# Patient Record
Sex: Female | Born: 1970
Health system: Southern US, Community
[De-identification: ages and names within clinical notes are randomized; demographics above are authoritative.]

## PROBLEM LIST (undated history)

## (undated) DIAGNOSIS — G8929 Other chronic pain: Secondary | ICD-10-CM

## (undated) DIAGNOSIS — M1990 Unspecified osteoarthritis, unspecified site: Secondary | ICD-10-CM

## (undated) DIAGNOSIS — R490 Dysphonia: Secondary | ICD-10-CM

## (undated) DIAGNOSIS — R519 Headache, unspecified: Secondary | ICD-10-CM

## (undated) DIAGNOSIS — R935 Abnormal findings on diagnostic imaging of other abdominal regions, including retroperitoneum: Secondary | ICD-10-CM

## (undated) DIAGNOSIS — K589 Irritable bowel syndrome without diarrhea: Secondary | ICD-10-CM

## (undated) DIAGNOSIS — F329 Major depressive disorder, single episode, unspecified: Secondary | ICD-10-CM

## (undated) DIAGNOSIS — G9050 Complex regional pain syndrome I, unspecified: Secondary | ICD-10-CM

## (undated) DIAGNOSIS — IMO0002 Reserved for concepts with insufficient information to code with codable children: Secondary | ICD-10-CM

## (undated) DIAGNOSIS — C801 Malignant (primary) neoplasm, unspecified: Secondary | ICD-10-CM

## (undated) DIAGNOSIS — F419 Anxiety disorder, unspecified: Secondary | ICD-10-CM

## (undated) DIAGNOSIS — D649 Anemia, unspecified: Secondary | ICD-10-CM

## (undated) DIAGNOSIS — K120 Recurrent oral aphthae: Secondary | ICD-10-CM

## (undated) DIAGNOSIS — B009 Herpesviral infection, unspecified: Secondary | ICD-10-CM

## (undated) DIAGNOSIS — D510 Vitamin B12 deficiency anemia due to intrinsic factor deficiency: Secondary | ICD-10-CM

## (undated) DIAGNOSIS — G629 Polyneuropathy, unspecified: Secondary | ICD-10-CM

## (undated) DIAGNOSIS — E785 Hyperlipidemia, unspecified: Secondary | ICD-10-CM

## (undated) DIAGNOSIS — F32A Depression, unspecified: Secondary | ICD-10-CM

## (undated) DIAGNOSIS — M5412 Radiculopathy, cervical region: Secondary | ICD-10-CM

## (undated) DIAGNOSIS — G4700 Insomnia, unspecified: Secondary | ICD-10-CM

## (undated) DIAGNOSIS — M76899 Other specified enthesopathies of unspecified lower limb, excluding foot: Secondary | ICD-10-CM

## (undated) HISTORY — DX: Major depressive disorder, single episode, unspecified: F32.9

## (undated) HISTORY — PX: OTHER SURGICAL HISTORY: SHX169

## (undated) HISTORY — DX: Herpesviral infection, unspecified: B00.9

## (undated) HISTORY — PX: WRIST SURGERY: SHX841

## (undated) HISTORY — DX: Abnormal findings on diagnostic imaging of other abdominal regions, including retroperitoneum: R93.5

## (undated) HISTORY — DX: Headache, unspecified: R51.9

## (undated) HISTORY — DX: Unspecified osteoarthritis, unspecified site: M19.90

## (undated) HISTORY — PX: HAND SURGERY: SHX662

## (undated) HISTORY — DX: Recurrent oral aphthae: K12.0

## (undated) HISTORY — DX: Complex regional pain syndrome I, unspecified: G90.50

## (undated) HISTORY — DX: Depression, unspecified: F32.A

## (undated) HISTORY — DX: Dysphonia: R49.0

## (undated) HISTORY — DX: Insomnia, unspecified: G47.00

## (undated) HISTORY — PX: KNEE SURGERY: SHX244

## (undated) HISTORY — PX: FOOT SURGERY: SHX648

## (undated) HISTORY — DX: Malignant (primary) neoplasm, unspecified: C80.1

## (undated) HISTORY — PX: BUNIONECTOMY: SHX129

## (undated) HISTORY — DX: Vitamin B12 deficiency anemia due to intrinsic factor deficiency: D51.0

## (undated) HISTORY — DX: Radiculopathy, cervical region: M54.12

## (undated) HISTORY — DX: Anemia, unspecified: D64.9

## (undated) HISTORY — DX: Anxiety disorder, unspecified: F41.9

## (undated) HISTORY — DX: Hyperlipidemia, unspecified: E78.5

## (undated) HISTORY — DX: Polyneuropathy, unspecified: G62.9

## (undated) HISTORY — PX: BACK SURGERY: SHX140

## (undated) HISTORY — DX: Other specified enthesopathies of unspecified lower limb, excluding foot: M76.899

## (undated) HISTORY — DX: Irritable bowel syndrome, unspecified: K58.9

## (undated) HISTORY — DX: Other chronic pain: G89.29

## (undated) HISTORY — DX: Reserved for concepts with insufficient information to code with codable children: IMO0002

---

## 1998-07-25 HISTORY — PX: ABDOMINAL HYSTERECTOMY: SHX81

## 2002-09-06 ENCOUNTER — Encounter: Admission: RE | Admit: 2002-09-06 | Discharge: 2002-10-18 | Payer: Self-pay | Admitting: *Deleted

## 2003-01-23 ENCOUNTER — Ambulatory Visit (HOSPITAL_BASED_OUTPATIENT_CLINIC_OR_DEPARTMENT_OTHER): Admission: RE | Admit: 2003-01-23 | Discharge: 2003-01-23 | Payer: Self-pay | Admitting: Orthopedic Surgery

## 2004-04-02 ENCOUNTER — Encounter: Admission: RE | Admit: 2004-04-02 | Discharge: 2004-04-02 | Payer: Self-pay | Admitting: Unknown Physician Specialty

## 2004-06-03 ENCOUNTER — Ambulatory Visit (HOSPITAL_BASED_OUTPATIENT_CLINIC_OR_DEPARTMENT_OTHER): Admission: RE | Admit: 2004-06-03 | Discharge: 2004-06-03 | Payer: Self-pay | Admitting: Orthopedic Surgery

## 2004-11-11 ENCOUNTER — Ambulatory Visit (HOSPITAL_BASED_OUTPATIENT_CLINIC_OR_DEPARTMENT_OTHER): Admission: RE | Admit: 2004-11-11 | Discharge: 2004-11-11 | Payer: Self-pay | Admitting: Orthopedic Surgery

## 2005-03-04 ENCOUNTER — Ambulatory Visit (HOSPITAL_COMMUNITY): Admission: RE | Admit: 2005-03-04 | Discharge: 2005-03-04 | Payer: Self-pay | Admitting: Orthopedic Surgery

## 2005-04-25 ENCOUNTER — Ambulatory Visit: Payer: Self-pay | Admitting: Internal Medicine

## 2005-06-10 ENCOUNTER — Ambulatory Visit: Payer: Self-pay | Admitting: Internal Medicine

## 2005-06-21 ENCOUNTER — Encounter: Payer: Self-pay | Admitting: Family Medicine

## 2005-06-21 ENCOUNTER — Ambulatory Visit: Payer: Self-pay | Admitting: Adult Health

## 2005-07-21 ENCOUNTER — Ambulatory Visit: Payer: Self-pay | Admitting: Internal Medicine

## 2005-09-14 ENCOUNTER — Ambulatory Visit: Payer: Self-pay | Admitting: Internal Medicine

## 2005-09-23 ENCOUNTER — Ambulatory Visit (HOSPITAL_COMMUNITY): Admission: RE | Admit: 2005-09-23 | Discharge: 2005-09-23 | Payer: Self-pay | Admitting: Orthopaedic Surgery

## 2005-10-04 ENCOUNTER — Encounter: Admission: RE | Admit: 2005-10-04 | Discharge: 2005-10-04 | Payer: Self-pay | Admitting: Orthopaedic Surgery

## 2005-10-18 ENCOUNTER — Ambulatory Visit: Payer: Self-pay | Admitting: Internal Medicine

## 2005-11-14 ENCOUNTER — Ambulatory Visit: Payer: Self-pay | Admitting: Internal Medicine

## 2006-01-23 ENCOUNTER — Ambulatory Visit: Payer: Self-pay | Admitting: Internal Medicine

## 2006-02-03 ENCOUNTER — Ambulatory Visit (HOSPITAL_COMMUNITY): Admission: RE | Admit: 2006-02-03 | Discharge: 2006-02-03 | Payer: Self-pay | Admitting: Internal Medicine

## 2006-02-03 ENCOUNTER — Encounter: Payer: Self-pay | Admitting: Family Medicine

## 2006-02-17 ENCOUNTER — Ambulatory Visit: Payer: Self-pay | Admitting: Internal Medicine

## 2006-02-20 ENCOUNTER — Encounter: Payer: Self-pay | Admitting: Family Medicine

## 2006-02-27 ENCOUNTER — Encounter: Payer: Self-pay | Admitting: Family Medicine

## 2006-02-27 ENCOUNTER — Encounter: Admission: RE | Admit: 2006-02-27 | Discharge: 2006-02-27 | Payer: Self-pay | Admitting: Internal Medicine

## 2006-03-13 ENCOUNTER — Encounter: Admission: RE | Admit: 2006-03-13 | Discharge: 2006-03-13 | Payer: Self-pay | Admitting: Internal Medicine

## 2006-03-23 ENCOUNTER — Ambulatory Visit: Payer: Self-pay | Admitting: Gastroenterology

## 2006-03-23 DIAGNOSIS — R935 Abnormal findings on diagnostic imaging of other abdominal regions, including retroperitoneum: Secondary | ICD-10-CM

## 2006-03-23 HISTORY — DX: Abnormal findings on diagnostic imaging of other abdominal regions, including retroperitoneum: R93.5

## 2006-03-24 ENCOUNTER — Encounter: Payer: Self-pay | Admitting: Family Medicine

## 2006-03-24 ENCOUNTER — Ambulatory Visit (HOSPITAL_COMMUNITY): Admission: RE | Admit: 2006-03-24 | Discharge: 2006-03-24 | Payer: Self-pay | Admitting: Internal Medicine

## 2006-05-01 ENCOUNTER — Ambulatory Visit: Payer: Self-pay | Admitting: Internal Medicine

## 2006-07-27 ENCOUNTER — Telehealth: Payer: Self-pay | Admitting: Family Medicine

## 2006-07-27 ENCOUNTER — Telehealth (INDEPENDENT_AMBULATORY_CARE_PROVIDER_SITE_OTHER): Payer: Self-pay | Admitting: *Deleted

## 2006-07-27 ENCOUNTER — Ambulatory Visit: Payer: Self-pay | Admitting: Family Medicine

## 2006-07-28 ENCOUNTER — Emergency Department (HOSPITAL_COMMUNITY): Admission: EM | Admit: 2006-07-28 | Discharge: 2006-07-28 | Payer: Self-pay | Admitting: Emergency Medicine

## 2006-08-21 ENCOUNTER — Encounter: Payer: Self-pay | Admitting: Family Medicine

## 2006-09-18 ENCOUNTER — Encounter: Payer: Self-pay | Admitting: Family Medicine

## 2006-09-22 ENCOUNTER — Telehealth: Payer: Self-pay | Admitting: Family Medicine

## 2006-10-03 ENCOUNTER — Encounter: Payer: Self-pay | Admitting: Family Medicine

## 2006-10-04 ENCOUNTER — Ambulatory Visit: Payer: Self-pay | Admitting: Family Medicine

## 2006-10-04 DIAGNOSIS — M549 Dorsalgia, unspecified: Secondary | ICD-10-CM | POA: Insufficient documentation

## 2006-10-04 DIAGNOSIS — G4700 Insomnia, unspecified: Secondary | ICD-10-CM | POA: Insufficient documentation

## 2006-10-04 DIAGNOSIS — F5104 Psychophysiologic insomnia: Secondary | ICD-10-CM | POA: Insufficient documentation

## 2006-10-27 ENCOUNTER — Ambulatory Visit: Payer: Self-pay | Admitting: Family Medicine

## 2006-10-27 DIAGNOSIS — B009 Herpesviral infection, unspecified: Secondary | ICD-10-CM | POA: Insufficient documentation

## 2006-10-27 DIAGNOSIS — L299 Pruritus, unspecified: Secondary | ICD-10-CM | POA: Insufficient documentation

## 2006-11-01 ENCOUNTER — Telehealth: Payer: Self-pay | Admitting: Family Medicine

## 2006-11-02 ENCOUNTER — Encounter: Payer: Self-pay | Admitting: Family Medicine

## 2006-11-23 ENCOUNTER — Telehealth: Payer: Self-pay | Admitting: Family Medicine

## 2006-12-06 ENCOUNTER — Ambulatory Visit: Payer: Self-pay | Admitting: Family Medicine

## 2007-01-03 ENCOUNTER — Telehealth: Payer: Self-pay | Admitting: Family Medicine

## 2007-01-03 ENCOUNTER — Encounter: Payer: Self-pay | Admitting: Family Medicine

## 2007-01-04 ENCOUNTER — Encounter: Admission: RE | Admit: 2007-01-04 | Discharge: 2007-01-04 | Payer: Self-pay | Admitting: Family Medicine

## 2007-01-12 ENCOUNTER — Telehealth: Payer: Self-pay | Admitting: Family Medicine

## 2007-01-25 ENCOUNTER — Ambulatory Visit: Payer: Self-pay | Admitting: Family Medicine

## 2007-01-25 DIAGNOSIS — M76899 Other specified enthesopathies of unspecified lower limb, excluding foot: Secondary | ICD-10-CM | POA: Insufficient documentation

## 2007-01-25 DIAGNOSIS — M79609 Pain in unspecified limb: Secondary | ICD-10-CM | POA: Insufficient documentation

## 2007-02-01 ENCOUNTER — Encounter: Admission: RE | Admit: 2007-02-01 | Discharge: 2007-02-01 | Payer: Self-pay | Admitting: Sports Medicine

## 2007-02-02 ENCOUNTER — Encounter: Admission: RE | Admit: 2007-02-02 | Discharge: 2007-02-02 | Payer: Self-pay | Admitting: Sports Medicine

## 2007-02-08 ENCOUNTER — Ambulatory Visit: Payer: Self-pay | Admitting: Family Medicine

## 2007-02-21 ENCOUNTER — Telehealth: Payer: Self-pay | Admitting: Family Medicine

## 2007-02-27 ENCOUNTER — Encounter: Payer: Self-pay | Admitting: Family Medicine

## 2007-02-28 ENCOUNTER — Telehealth: Payer: Self-pay | Admitting: Family Medicine

## 2007-03-16 ENCOUNTER — Telehealth: Payer: Self-pay | Admitting: Family Medicine

## 2007-03-16 ENCOUNTER — Encounter: Payer: Self-pay | Admitting: Family Medicine

## 2007-03-23 ENCOUNTER — Encounter: Payer: Self-pay | Admitting: Family Medicine

## 2007-03-23 DIAGNOSIS — G8929 Other chronic pain: Secondary | ICD-10-CM | POA: Insufficient documentation

## 2007-03-23 DIAGNOSIS — Z1211 Encounter for screening for malignant neoplasm of colon: Secondary | ICD-10-CM | POA: Insufficient documentation

## 2007-03-23 DIAGNOSIS — K120 Recurrent oral aphthae: Secondary | ICD-10-CM | POA: Insufficient documentation

## 2007-03-24 ENCOUNTER — Ambulatory Visit (HOSPITAL_COMMUNITY): Admission: RE | Admit: 2007-03-24 | Discharge: 2007-03-24 | Payer: Self-pay | Admitting: Neurology

## 2007-03-25 DIAGNOSIS — M1990 Unspecified osteoarthritis, unspecified site: Secondary | ICD-10-CM | POA: Insufficient documentation

## 2007-03-25 DIAGNOSIS — E785 Hyperlipidemia, unspecified: Secondary | ICD-10-CM | POA: Insufficient documentation

## 2007-03-25 DIAGNOSIS — F339 Major depressive disorder, recurrent, unspecified: Secondary | ICD-10-CM | POA: Insufficient documentation

## 2007-03-29 ENCOUNTER — Telehealth: Payer: Self-pay | Admitting: Family Medicine

## 2007-04-30 ENCOUNTER — Telehealth: Payer: Self-pay | Admitting: Family Medicine

## 2007-05-17 ENCOUNTER — Telehealth: Payer: Self-pay | Admitting: Family Medicine

## 2007-05-31 ENCOUNTER — Telehealth: Payer: Self-pay | Admitting: Family Medicine

## 2007-06-14 ENCOUNTER — Telehealth: Payer: Self-pay | Admitting: Family Medicine

## 2007-06-15 ENCOUNTER — Telehealth: Payer: Self-pay | Admitting: Family Medicine

## 2007-06-20 ENCOUNTER — Telehealth: Payer: Self-pay | Admitting: Family Medicine

## 2007-08-06 ENCOUNTER — Telehealth: Payer: Self-pay | Admitting: Family Medicine

## 2007-08-09 ENCOUNTER — Ambulatory Visit: Payer: Self-pay | Admitting: Family Medicine

## 2007-08-15 ENCOUNTER — Telehealth: Payer: Self-pay | Admitting: Family Medicine

## 2007-08-15 ENCOUNTER — Encounter: Admission: RE | Admit: 2007-08-15 | Discharge: 2007-08-15 | Payer: Self-pay | Admitting: Family Medicine

## 2007-08-17 ENCOUNTER — Encounter: Admission: RE | Admit: 2007-08-17 | Discharge: 2007-08-17 | Payer: Self-pay | Admitting: Family Medicine

## 2007-08-20 ENCOUNTER — Encounter: Payer: Self-pay | Admitting: Family Medicine

## 2007-08-28 ENCOUNTER — Telehealth: Payer: Self-pay | Admitting: Family Medicine

## 2007-09-04 ENCOUNTER — Ambulatory Visit: Payer: Self-pay | Admitting: Family Medicine

## 2007-09-06 ENCOUNTER — Ambulatory Visit: Payer: Self-pay | Admitting: Family Medicine

## 2007-09-06 DIAGNOSIS — D510 Vitamin B12 deficiency anemia due to intrinsic factor deficiency: Secondary | ICD-10-CM | POA: Insufficient documentation

## 2007-09-18 ENCOUNTER — Encounter: Admission: RE | Admit: 2007-09-18 | Discharge: 2007-09-18 | Payer: Self-pay | Admitting: Family Medicine

## 2007-10-08 ENCOUNTER — Ambulatory Visit: Payer: Self-pay | Admitting: Family Medicine

## 2007-10-09 ENCOUNTER — Ambulatory Visit (HOSPITAL_COMMUNITY): Admission: RE | Admit: 2007-10-09 | Discharge: 2007-10-09 | Payer: Self-pay | Admitting: General Surgery

## 2007-10-09 ENCOUNTER — Telehealth: Payer: Self-pay | Admitting: Family Medicine

## 2007-10-09 LAB — CONVERTED CEMR LAB
BUN: 7 mg/dL (ref 6–23)
CO2: 25 meq/L (ref 19–32)
Calcium: 9.5 mg/dL (ref 8.4–10.5)
Chloride: 104 meq/L (ref 96–112)
Creatinine, Ser: 0.65 mg/dL (ref 0.40–1.20)
Hemoglobin: 14.7 g/dL (ref 12.0–15.0)
Lymphocytes Relative: 29 % (ref 12–46)
Monocytes Absolute: 0.5 10*3/uL (ref 0.1–1.0)
Monocytes Relative: 7 % (ref 3–12)
Neutro Abs: 4.5 10*3/uL (ref 1.7–7.7)
RBC: 4.89 M/uL (ref 3.87–5.11)

## 2007-10-11 ENCOUNTER — Ambulatory Visit: Payer: Self-pay | Admitting: Internal Medicine

## 2007-10-23 ENCOUNTER — Ambulatory Visit: Payer: Self-pay | Admitting: Internal Medicine

## 2007-10-23 ENCOUNTER — Encounter: Payer: Self-pay | Admitting: Internal Medicine

## 2007-10-23 ENCOUNTER — Encounter: Payer: Self-pay | Admitting: Family Medicine

## 2007-10-23 LAB — CONVERTED CEMR LAB: Cortisol, Plasma: 11.7 ug/dL

## 2007-11-09 ENCOUNTER — Telehealth: Payer: Self-pay | Admitting: Family Medicine

## 2007-11-15 ENCOUNTER — Ambulatory Visit: Payer: Self-pay | Admitting: Family Medicine

## 2007-11-15 DIAGNOSIS — N644 Mastodynia: Secondary | ICD-10-CM | POA: Insufficient documentation

## 2007-11-23 ENCOUNTER — Encounter: Admission: RE | Admit: 2007-11-23 | Discharge: 2007-11-23 | Payer: Self-pay | Admitting: Family Medicine

## 2007-11-27 ENCOUNTER — Encounter: Payer: Self-pay | Admitting: Family Medicine

## 2007-11-28 ENCOUNTER — Encounter: Payer: Self-pay | Admitting: Family Medicine

## 2007-12-10 ENCOUNTER — Encounter: Admission: RE | Admit: 2007-12-10 | Discharge: 2007-12-10 | Payer: Self-pay | Admitting: Family Medicine

## 2007-12-10 ENCOUNTER — Encounter (INDEPENDENT_AMBULATORY_CARE_PROVIDER_SITE_OTHER): Payer: Self-pay | Admitting: Diagnostic Radiology

## 2007-12-14 ENCOUNTER — Encounter: Payer: Self-pay | Admitting: Family Medicine

## 2007-12-14 ENCOUNTER — Encounter: Admission: RE | Admit: 2007-12-14 | Discharge: 2008-02-14 | Payer: Self-pay | Admitting: Family Medicine

## 2007-12-14 DIAGNOSIS — M5412 Radiculopathy, cervical region: Secondary | ICD-10-CM | POA: Insufficient documentation

## 2007-12-28 ENCOUNTER — Telehealth: Payer: Self-pay | Admitting: Family Medicine

## 2008-01-04 ENCOUNTER — Encounter: Admission: RE | Admit: 2008-01-04 | Discharge: 2008-01-04 | Payer: Self-pay | Admitting: Family Medicine

## 2008-01-04 ENCOUNTER — Encounter (INDEPENDENT_AMBULATORY_CARE_PROVIDER_SITE_OTHER): Payer: Self-pay | Admitting: Diagnostic Radiology

## 2008-01-14 ENCOUNTER — Telehealth: Payer: Self-pay | Admitting: Family Medicine

## 2008-01-17 ENCOUNTER — Encounter: Payer: Self-pay | Admitting: Family Medicine

## 2008-02-01 ENCOUNTER — Telehealth: Payer: Self-pay | Admitting: Family Medicine

## 2008-02-01 ENCOUNTER — Encounter: Admission: RE | Admit: 2008-02-01 | Discharge: 2008-02-01 | Payer: Self-pay | Admitting: Family Medicine

## 2008-02-05 ENCOUNTER — Telehealth: Payer: Self-pay | Admitting: Family Medicine

## 2008-02-18 ENCOUNTER — Telehealth: Payer: Self-pay | Admitting: Family Medicine

## 2008-02-19 ENCOUNTER — Telehealth: Payer: Self-pay | Admitting: Family Medicine

## 2008-02-26 ENCOUNTER — Encounter: Payer: Self-pay | Admitting: Family Medicine

## 2008-03-20 ENCOUNTER — Ambulatory Visit: Payer: Self-pay | Admitting: Family Medicine

## 2008-05-26 ENCOUNTER — Ambulatory Visit: Payer: Self-pay | Admitting: Family Medicine

## 2008-06-06 ENCOUNTER — Encounter: Payer: Self-pay | Admitting: Family Medicine

## 2008-08-01 ENCOUNTER — Telehealth: Payer: Self-pay | Admitting: Family Medicine

## 2008-08-11 ENCOUNTER — Ambulatory Visit: Payer: Self-pay | Admitting: Family Medicine

## 2008-08-11 ENCOUNTER — Encounter: Admission: RE | Admit: 2008-08-11 | Discharge: 2008-08-11 | Payer: Self-pay | Admitting: Family Medicine

## 2008-08-11 DIAGNOSIS — M25569 Pain in unspecified knee: Secondary | ICD-10-CM | POA: Insufficient documentation

## 2008-09-03 ENCOUNTER — Telehealth: Payer: Self-pay | Admitting: Family Medicine

## 2008-09-03 ENCOUNTER — Ambulatory Visit: Payer: Self-pay | Admitting: Family Medicine

## 2008-09-03 DIAGNOSIS — R5381 Other malaise: Secondary | ICD-10-CM | POA: Insufficient documentation

## 2008-09-03 DIAGNOSIS — R5383 Other fatigue: Secondary | ICD-10-CM

## 2008-09-08 LAB — CONVERTED CEMR LAB
AST: 12 units/L (ref 0–37)
Albumin: 4.6 g/dL (ref 3.5–5.2)
Alkaline Phosphatase: 63 units/L (ref 39–117)
BUN: 14 mg/dL (ref 6–23)
FSH: 6.7 milliintl units/mL
Hemoglobin: 14.3 g/dL (ref 12.0–15.0)
MCHC: 31.9 g/dL (ref 30.0–36.0)
Potassium: 4.4 meq/L (ref 3.5–5.3)
Progesterone: 13.8 ng/mL
RDW: 13 % (ref 11.5–15.5)
Sodium: 139 meq/L (ref 135–145)
TIBC: 306 ug/dL (ref 250–470)
Total Bilirubin: 0.5 mg/dL (ref 0.3–1.2)
UIBC: 222 ug/dL
Vit D, 1,25-Dihydroxy: 16 — ABNORMAL LOW (ref 30–89)

## 2008-09-09 ENCOUNTER — Telehealth: Payer: Self-pay | Admitting: Family Medicine

## 2008-09-29 ENCOUNTER — Telehealth: Payer: Self-pay | Admitting: Family Medicine

## 2008-10-07 ENCOUNTER — Telehealth (INDEPENDENT_AMBULATORY_CARE_PROVIDER_SITE_OTHER): Payer: Self-pay | Admitting: *Deleted

## 2008-10-15 ENCOUNTER — Telehealth: Payer: Self-pay | Admitting: Family Medicine

## 2008-10-17 ENCOUNTER — Encounter: Admission: RE | Admit: 2008-10-17 | Discharge: 2008-10-17 | Payer: Self-pay | Admitting: Family Medicine

## 2008-11-03 ENCOUNTER — Encounter: Payer: Self-pay | Admitting: Family Medicine

## 2009-01-15 ENCOUNTER — Telehealth: Payer: Self-pay | Admitting: Family Medicine

## 2009-02-13 ENCOUNTER — Ambulatory Visit (HOSPITAL_COMMUNITY): Payer: Self-pay | Admitting: Psychiatry

## 2009-02-18 ENCOUNTER — Ambulatory Visit (HOSPITAL_COMMUNITY): Payer: Self-pay | Admitting: Psychiatry

## 2009-02-25 ENCOUNTER — Ambulatory Visit (HOSPITAL_COMMUNITY): Payer: Self-pay | Admitting: Psychiatry

## 2009-03-12 ENCOUNTER — Ambulatory Visit (HOSPITAL_COMMUNITY): Payer: Self-pay | Admitting: Psychology

## 2009-04-03 ENCOUNTER — Encounter: Admission: RE | Admit: 2009-04-03 | Discharge: 2009-04-03 | Payer: Self-pay | Admitting: Family Medicine

## 2009-04-13 ENCOUNTER — Ambulatory Visit: Payer: Self-pay | Admitting: Family Medicine

## 2009-04-23 ENCOUNTER — Ambulatory Visit: Payer: Self-pay | Admitting: Family Medicine

## 2009-04-23 DIAGNOSIS — R0789 Other chest pain: Secondary | ICD-10-CM | POA: Insufficient documentation

## 2009-04-23 DIAGNOSIS — R002 Palpitations: Secondary | ICD-10-CM | POA: Insufficient documentation

## 2009-04-29 ENCOUNTER — Encounter: Payer: Self-pay | Admitting: Family Medicine

## 2009-04-29 ENCOUNTER — Encounter: Payer: Self-pay | Admitting: Cardiology

## 2009-04-29 ENCOUNTER — Ambulatory Visit: Payer: Self-pay | Admitting: Cardiology

## 2009-04-29 DIAGNOSIS — F17200 Nicotine dependence, unspecified, uncomplicated: Secondary | ICD-10-CM | POA: Insufficient documentation

## 2009-05-15 ENCOUNTER — Ambulatory Visit: Payer: Self-pay

## 2009-05-15 ENCOUNTER — Ambulatory Visit (HOSPITAL_COMMUNITY): Admission: RE | Admit: 2009-05-15 | Discharge: 2009-05-15 | Payer: Self-pay | Admitting: Internal Medicine

## 2009-05-15 ENCOUNTER — Encounter: Payer: Self-pay | Admitting: Cardiology

## 2009-05-15 ENCOUNTER — Ambulatory Visit: Payer: Self-pay | Admitting: Internal Medicine

## 2009-05-21 ENCOUNTER — Ambulatory Visit: Payer: Self-pay | Admitting: Family Medicine

## 2009-06-30 ENCOUNTER — Telehealth: Payer: Self-pay | Admitting: Family Medicine

## 2009-07-01 ENCOUNTER — Telehealth: Payer: Self-pay | Admitting: Family Medicine

## 2009-07-13 ENCOUNTER — Ambulatory Visit: Payer: Self-pay | Admitting: Family Medicine

## 2009-07-13 DIAGNOSIS — J209 Acute bronchitis, unspecified: Secondary | ICD-10-CM | POA: Insufficient documentation

## 2009-07-20 ENCOUNTER — Ambulatory Visit: Payer: Self-pay | Admitting: Family Medicine

## 2009-08-25 ENCOUNTER — Telehealth: Payer: Self-pay | Admitting: Family Medicine

## 2009-08-25 ENCOUNTER — Encounter: Payer: Self-pay | Admitting: Family Medicine

## 2009-08-25 LAB — CONVERTED CEMR LAB
Blood in Urine, dipstick: NEGATIVE
Glucose, Urine, Semiquant: NEGATIVE
Ketones, urine, test strip: NEGATIVE
pH: 5.5

## 2009-08-27 ENCOUNTER — Telehealth: Payer: Self-pay | Admitting: Family Medicine

## 2009-09-07 ENCOUNTER — Encounter: Payer: Self-pay | Admitting: Family Medicine

## 2009-09-24 ENCOUNTER — Telehealth: Payer: Self-pay | Admitting: Family Medicine

## 2009-10-23 ENCOUNTER — Telehealth: Payer: Self-pay | Admitting: Family Medicine

## 2009-10-29 ENCOUNTER — Telehealth: Payer: Self-pay | Admitting: Family Medicine

## 2010-01-07 ENCOUNTER — Encounter: Payer: Self-pay | Admitting: Family Medicine

## 2010-01-07 ENCOUNTER — Encounter: Admission: RE | Admit: 2010-01-07 | Discharge: 2010-01-07 | Payer: Self-pay | Admitting: Sports Medicine

## 2010-01-15 ENCOUNTER — Encounter: Admission: RE | Admit: 2010-01-15 | Discharge: 2010-03-25 | Payer: Self-pay | Admitting: Sports Medicine

## 2010-02-08 ENCOUNTER — Encounter: Payer: Self-pay | Admitting: Family Medicine

## 2010-02-09 ENCOUNTER — Ambulatory Visit: Payer: Self-pay | Admitting: Family Medicine

## 2010-02-09 ENCOUNTER — Encounter: Admission: RE | Admit: 2010-02-09 | Discharge: 2010-02-09 | Payer: Self-pay | Admitting: Family Medicine

## 2010-02-09 DIAGNOSIS — M25559 Pain in unspecified hip: Secondary | ICD-10-CM | POA: Insufficient documentation

## 2010-02-09 DIAGNOSIS — IMO0002 Reserved for concepts with insufficient information to code with codable children: Secondary | ICD-10-CM | POA: Insufficient documentation

## 2010-02-10 ENCOUNTER — Encounter: Payer: Self-pay | Admitting: Family Medicine

## 2010-03-09 ENCOUNTER — Ambulatory Visit: Payer: Self-pay | Admitting: Family Medicine

## 2010-03-09 DIAGNOSIS — M2550 Pain in unspecified joint: Secondary | ICD-10-CM | POA: Insufficient documentation

## 2010-03-09 DIAGNOSIS — N951 Menopausal and female climacteric states: Secondary | ICD-10-CM | POA: Insufficient documentation

## 2010-03-09 DIAGNOSIS — F341 Dysthymic disorder: Secondary | ICD-10-CM | POA: Insufficient documentation

## 2010-03-11 ENCOUNTER — Encounter: Payer: Self-pay | Admitting: Family Medicine

## 2010-03-18 LAB — CONVERTED CEMR LAB
CRP: 0 mg/dL (ref <0.6)
HCT: 46.2 % — ABNORMAL HIGH (ref 36.0–46.0)
Hemoglobin: 14.8 g/dL (ref 12.0–15.0)
Platelets: 329 10*3/uL (ref 150–400)
RDW: 13.5 % (ref 11.5–15.5)
Rhuematoid fact SerPl-aCnc: <20 intl units/mL (ref 0–20)
TSH: 0.84 microintl units/mL (ref 0.350–4.500)
WBC: 8.3 10*3/uL (ref 4.0–10.5)

## 2010-04-09 ENCOUNTER — Telehealth: Payer: Self-pay | Admitting: Family Medicine

## 2010-04-21 ENCOUNTER — Encounter: Admission: RE | Admit: 2010-04-21 | Discharge: 2010-04-21 | Payer: Self-pay | Admitting: Family Medicine

## 2010-08-03 ENCOUNTER — Ambulatory Visit
Admission: RE | Admit: 2010-08-03 | Discharge: 2010-08-03 | Payer: Self-pay | Source: Home / Self Care | Attending: Family Medicine | Admitting: Family Medicine

## 2010-08-03 DIAGNOSIS — N63 Unspecified lump in unspecified breast: Secondary | ICD-10-CM | POA: Insufficient documentation

## 2010-08-05 ENCOUNTER — Encounter
Admission: RE | Admit: 2010-08-05 | Discharge: 2010-08-05 | Payer: Self-pay | Source: Home / Self Care | Attending: Family Medicine | Admitting: Family Medicine

## 2010-08-15 ENCOUNTER — Encounter: Payer: Self-pay | Admitting: Family Medicine

## 2010-08-22 LAB — CONVERTED CEMR LAB
ALT: 17 units/L (ref 0–35)
AST: 22 units/L (ref 0–37)
Albumin: 4.3 g/dL (ref 3.5–5.2)
Alkaline Phosphatase: 47 units/L (ref 39–117)
Basophils Absolute: 0 10*3/uL (ref 0.0–0.1)
Calcium: 9.6 mg/dL (ref 8.4–10.5)
Chloride: 103 meq/L (ref 96–112)
HCT: 40.1 % (ref 36.0–46.0)
LDL Cholesterol: 122 mg/dL — ABNORMAL HIGH (ref 0–99)
Lymphocytes Relative: 24 % (ref 12–46)
Lymphs Abs: 2.1 10*3/uL (ref 0.7–4.0)
Neutro Abs: 5.6 10*3/uL (ref 1.7–7.7)
Neutrophils Relative %: 65 % (ref 43–77)
Platelets: 290 10*3/uL (ref 150–400)
Potassium: 4.7 meq/L (ref 3.5–5.3)
Sodium: 136 meq/L (ref 135–145)
TSH: 1.798 microintl units/mL (ref 0.350–4.500)
WBC: 8.6 10*3/uL (ref 4.0–10.5)

## 2010-08-24 NOTE — Progress Notes (Signed)
Summary: + mono  Phone Note Call from Patient   Summary of Call: Tested + for mono. Have severe back painwith it.  Will call in tramadol for pain. OUt of work for 2 days.  Initial call taken by: Nani Gasser MD,  August 27, 2009 9:12 AM    New/Updated Medications: TRAMADOL HCL 50 MG TABS (TRAMADOL HCL) Take 1 tablet by mouth three times a day as needed back pain. Prescriptions: TRAMADOL HCL 50 MG TABS (TRAMADOL HCL) Take 1 tablet by mouth three times a day as needed back pain.  #30 x 0   Entered and Authorized by:   Nani Gasser MD   Signed by:   Nani Gasser MD on 08/27/2009   Method used:   Electronically to        [facility] [phone]* (retail)       [address] [place], [place]  [address]       Ph: [phone]       Fax: [phone]   RxID:   [id number]

## 2010-08-24 NOTE — Progress Notes (Signed)
----   Converted from flag ---- ---- 09/23/2009 11:46 AM, Kathlene November wrote: Dr. Linford Arnold can I have some Phentermine generic I can not stop this eating binge and I know this will help stop it and get me on the right track. ------------------------------       New/Updated Medications: PHENTERMINE HCL 37.5 MG CAPS (PHENTERMINE HCL) Take 1 tablet by mouth once a day Prescriptions: PHENTERMINE HCL 37.5 MG CAPS (PHENTERMINE HCL) Take 1 tablet by mouth once a day  #30 x 0   Entered and Authorized by:   Nani Gasser MD   Signed by:   Nani Gasser MD on 09/24/2009   Method used:   Print then Give to Patient   RxID:   [id number]

## 2010-08-24 NOTE — Progress Notes (Signed)
Summary: Wants to start Chantix.   Phone Note Outgoing Call   Summary of Call: Would like to start Chantix. Discussed if any mood changes or bad dreams then needs to stop the medications.  Initial call taken by: Nani Gasser MD,  October 29, 2009 3:05 PM    New/Updated Medications: CHANTIX STARTING MONTH PAK 0.5 MG X 11 & 1 MG X 42 TABS (VARENICLINE TARTRATE) Take as directed. Prescriptions: CHANTIX STARTING MONTH PAK 0.5 MG X 11 & 1 MG X 42 TABS (VARENICLINE TARTRATE) Take as directed.  #1 pack x 0   Entered and Authorized by:   Nani Gasser MD   Signed by:   Nani Gasser MD on 10/29/2009   Method used:   Printed then faxed to ...       [facility]* (retail)       [address]       [address] Shipping/mailing       [place], [place]  [address]       Ph: [phone]       Fax: [phone]   RxID:   [id number]

## 2010-08-24 NOTE — Progress Notes (Signed)
Summary: UTI  Phone Note Call from Patient   Summary of Call: Urinary frequency and low back pain for several days. No fever or recent ABX. No hematuria. UA + for Large LE so will treat and if not better in 3 days then let me know.  Initial call taken by: Nani Gasser MD,  August 25, 2009 1:25 PM    New/Updated Medications: BACTRIM DS 800-160 MG TABS (SULFAMETHOXAZOLE-TRIMETHOPRIM) Take 1 tablet by mouth two times a day for 3 days Prescriptions: BACTRIM DS 800-160 MG TABS (SULFAMETHOXAZOLE-TRIMETHOPRIM) Take 1 tablet by mouth two times a day for 3 days  #6 x 0   Entered and Authorized by:   Nani Gasser MD   Signed by:   Nani Gasser MD on 08/25/2009   Method used:   Electronically to        [facility] [phone]* (retail)       [address] [place], [place]  [address]       Ph: [phone]       Fax: [phone]   RxID:   [id number]   Laboratory Results   Urine Tests  Date/Time Received: February  1, 20111:27 PM 1:27 PM   Routine Urinalysis   Appearance: Clear Glucose: negative   (Normal Range: Negative) Bilirubin: negative   (Normal Range: Negative) Ketone: negative   (Normal Range: Negative) Spec. Gravity: <1.005   (Normal Range: 1.003-1.035) Blood: negative   (Normal Range: Negative) pH: 5.5   (Normal Range: 5.0-8.0) Protein: negative   (Normal Range: Negative) Urobilinogen: 0.2   (Normal Range: 0-1) Nitrite: negative   (Normal Range: Negative) Leukocyte Esterace: large   (Normal Range: Negative)

## 2010-08-24 NOTE — Consult Note (Signed)
Summary: [facility]  [facility]   Imported By: Marily Memos 03/02/2010 13:44:10  _____________________________________________________________________  External Attachment:    Type:   Image     Comment:   External Document

## 2010-08-24 NOTE — Miscellaneous (Signed)
Summary: [facility]  [facility]   Imported By: Marily Memos 03/02/2010 13:29:19  _____________________________________________________________________  External Attachment:    Type:   Image     Comment:   External Document

## 2010-08-24 NOTE — Assessment & Plan Note (Signed)
Summary: Hot flashes, mood, etc   Vital Signs:  Patient profile:   40 year old female Height:      65 inches Weight:      140 pounds Pulse rate:   84 / minute BP sitting:   110 / 64  (left arm) Cuff size:   regular  Vitals Entered By: Avon Gully CMA, Duncan Dull) (March 09, 2010 3:10 PM) CC: discuss hormones   Primary Care Provider:  Nani Gasser MD  CC:  discuss hormones.  History of Present Illness: discuss hormones.    With in the last month waking up mutliple times at night. Still using 2 xanax and taking the seroquel at bedtime.  These normally help but for the last month.  Feels mood is labile and feels really mad and angry all the time.  Feeling emotional and tearful . Has noticed her Hair is very dry.  Tried shampoos and conditioners but no relief.  hair is breaking off easily.  No skin changes. Feels her short term memory is getting worse. Had a hysterectomy.  Mom went through menopause at age 45. Her sxs are wrose in the evening.  + hot flashes for months, but seem to be getting worse.  has been on prednisone recenlty for her mouth ulcers.did try estradiol about a year ago but only took it for about a month.   also f/u for possible rheum testing.  Hx of probably Bechets.  Also hx of jont pain in her hips, back, knees and feet.   Current Medications (verified): 1)  Prednisone 10 Mg Tabs (Prednisone) .... Take As Directed. 2)  Xanax 1 Mg Tabs (Alprazolam) .... One or Two Tabs At Bedtime For Insomnia 3)  Excedrin Migraine 250-250-65 Mg Tabs (Aspirin-Acetaminophen-Caffeine) .... As Needed 4)  Seroquel 100 Mg Tabs (Quetiapine Fumarate) .... Take 1 Tablet By Mouth Once A Day At Bedtime 5)  Zovirax 400 Mg Tabs (Acyclovir) .... Take 1 Tablet By Mouth Three Times A Day For 5 Days As Needed For Flare. 6)  Tramadol Hcl 50 Mg Tabs (Tramadol Hcl) .... Take 1 Tablet By Mouth Three Times A Day As Needed Back Pain. 7)  Phentermine Hcl 37.5 Mg Caps (Phentermine Hcl) .... Take 1  Tablet By Mouth Once A Day  Allergies (verified): 1)  ! Pcn 2)  ! Vicodin 3)  ! * Lethium  Comments:  Nurse/Medical Assistant: The patient's medications and allergies were reviewed with the patient and were updated in the Medication and Allergy Lists. Avon Gully CMA, Duncan Dull) (March 09, 2010 3:11 PM)  Family History: Father with alcoholism, Hi cholesterol, HTN, CABG (died at 44); CHF Depression in aunts, sister Mother with migraines MGF with MI Sister with MVP Brother deceased  Social History: Engineer, civil (consulting) at [employer].  College degree. Divorced with 2 kids.  Lives alone.   Current Smoker Regular exercise-no Drug use-no Alcohol Use - no  Physical Exam  General:  Well-developed,well-nourished,in no acute distress; alert,appropriate and cooperative throughout examination   Impression & Recommendations:  Problem # 1:  HOT FLASHES (ICD-627.2)  Will restart the estradiol. Did discuss bioidenticals but too costly at this time.  Suspect that there is an imbalance in her progresterone since she is not sleeping well. Cycle on for 21 days and then cycle off for 7.  F/u in one month.  Discussed importance of yearly mammogram screening on HRT.  Her updated medication list for this problem includes:    Estradiol 1 Mg Tabs (Estradiol) .Marland Kitchen... Take 1 tablet by  mouth once a day for 21 days, then off for 7. then repeat.  Orders: T-CBC No Diff ([id number]) T-TSH ([id number])  Problem # 2:  MOOD DISORDER (ICD-296.90) Not sure if realted to her hormones or depression or recent steroid use. We definitely need to improve her sleep to help her mood.    Problem # 3:  APHTHOUS ULCERS (ICD-528.2)  This in conjunction with possible ankylosing spondilitis as seen on MRI I do think a rheum workup is recommended. Will order labs and f/u iwht results with patient. Still rec f/u with DERM as onlywent once and never followed up.   Orders: T-Sed Rate (Automated) [id number]) T-CRP  (C-Reactive Protein) ([id number])  Complete Medication List: 1)  Prednisone 10 Mg Tabs (Prednisone) .... Take as directed. 2)  Xanax 1 Mg Tabs (Alprazolam) .... One or two tabs at bedtime for insomnia 3)  Excedrin Migraine 250-250-65 Mg Tabs (Aspirin-acetaminophen-caffeine) .... As needed 4)  Seroquel 100 Mg Tabs (Quetiapine fumarate) .... Take 1 tablet by mouth once a day at bedtime 5)  Zovirax 400 Mg Tabs (Acyclovir) .... Take 1 tablet by mouth three times a day for 5 days as needed for flare. 6)  Tramadol Hcl 50 Mg Tabs (Tramadol hcl) .... Take 1 tablet by mouth three times a day as needed back pain. 7)  Phentermine Hcl 37.5 Mg Caps (Phentermine hcl) .... Take 1 tablet by mouth once a day 8)  Estradiol 1 Mg Tabs (Estradiol) .... Take 1 tablet by mouth once a day for 21 days, then off for 7. then repeat.  Other Orders: T-ANA [id number]) T-Rheumatoid Factor [id number]) T-HLA B27 ([id number]) Prescriptions: ESTRADIOL 1 MG TABS (ESTRADIOL) Take 1 tablet by mouth once a day for 21 days, then off for 7. Then repeat.  #21 x 2   Entered and Authorized by:   Nani Gasser MD   Signed by:   Nani Gasser MD on 03/09/2010   Method used:   Electronically to        [facility] [phone]* (retail)       [address]       [place], [place]  [address]       Ph: [phone]       Fax: [phone]   RxID:   [id number]

## 2010-08-24 NOTE — Progress Notes (Signed)
----   Converted from flag ---- ---- 10/23/2009 11:51 AM, Kathlene November wrote: I need to do something- it has flared before but never lasted this long and to this extent- injection did not help at all  ---- 10/23/2009 11:50 AM, Nani Gasser MD wrote: We could do formal PT or even have ortho sports med see you. We can try to inject again but I don't think that really helped you much last time.  Let me know what you think.  We could see if one of the orthos could squeeze you in this afternoon if you would like.   ---- 10/23/2009 11:40 AM, Kathlene November wrote: What would you suggest I do for the bursitis in my right hip- it has flared and hurt since i went to [place] and I have been on Ibuprofen 800mg  OTC, I have triad the Celebrex, and now the Tramadol which helps some but the pain is in my lower back and in thigh area- getting almost unbearable- Im doing the stretching exercises and having Jeff massage it which not sure if good idea cause it hurts like hell. ------------------------------
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
## 2010-08-24 NOTE — Progress Notes (Signed)
  Phone Note Call from Patient   Summary of Call: Has a URI. Coughing alot at night. Keeping her awake. Wouldl ike rx for tussionex.  Initial call taken by: Nani Gasser MD,  April 09, 2010 4:32 PM    New/Updated Medications: Sandria Senter ER 10-8 MG/5ML LQCR (HYDROCOD POLST-CHLORPHEN POLST) 5ml by mouth at bedtime as needed cough Prescriptions: TUSSIONEX PENNKINETIC ER 10-8 MG/5ML LQCR (HYDROCOD POLST-CHLORPHEN POLST) 5ml by mouth at bedtime as needed cough  #80 ml x 0   Entered and Authorized by:   Nani Gasser MD   Signed by:   Nani Gasser MD on 04/09/2010   Method used:   Printed then faxed to ...       [facility] [phone]* (retail)       [address] [place], [place]  [address]       Ph: [phone]       Fax: [phone]   RxID:   [id number]

## 2010-08-24 NOTE — Consult Note (Signed)
Summary: [facility]  [facility]   Imported By: Lanelle Bal 01/19/2010 11:19:26  _____________________________________________________________________  External Attachment:    Type:   Image     Comment:   External Document

## 2010-08-24 NOTE — Letter (Signed)
Summary: Generic Letter  [facility]  [address]   [place], [place] [address]   Phone: [phone]  Fax: [phone]    09/07/2009  [name] [address] [place], [place]  [address]  To Whom It May Concern,  You are medically released to return to work after recent illness.  You are Afebrile and feeling better.       Sincerely,    Seymour Bars DO

## 2010-08-24 NOTE — Letter (Signed)
Summary: Generic Letter  [facility]  [address]   [place], [place] [address]   Phone: [phone]  Fax: [phone]    02/10/2010  [name] [address] [place], [place]  [address]  Dear Santina Evans,  I had the pleasure of meeting [name] yesterday after she had an acute muscle tear while doing some advanced proprioceptive and strength training in PT.   I think this is a grade 1 muscle tear and she should recover relatively quickly, but have asked her to hold off on PT/training for a few weeks.  She raised a question about ankylosing spondylitis in the office - she thinks she was told this by a physician in the past. She has had some recurrent mouth ulcers and mentions some purpling of her fingers but denies true Reynaud's. A basic rheumatological work-up is reasonable, but I thought it would be better to defer to your experience with her whether you would work-up or refer.     Sincerely,   Hannah Beat MD

## 2010-08-26 NOTE — Assessment & Plan Note (Signed)
Summary: Breast LUmp   Primary Care Provider:  Nani Gasser MD   History of Present Illness: Her boyfriend notices a lump in her Right breast. ONe lesion on the outer breast and one underneath. They were tender adn still are but not as tender as they were yesterday. Hx of benigng bx on the left breast but never had any abnormalities on the right.  Had normal mammo about 6 months.    Allergies: 1)  ! Pcn 2)  ! Vicodin 3)  ! * Lethium  Physical Exam  General:  Well-developed,well-nourished,in no acute distress; alert,appropriate and cooperative throughout examination Head:  Normocephalic and atraumatic without obvious abnormalities. No apparent alopecia or balding. Breasts:  Left breast with  a small oval type lesion at the 7 o;clock position approx 2 cm from teh edge of the areola.  Is is slightly tender and is approx 0.5 by 1 cm. Also similar oval type lesion in the 4 o'clock position that is approx 3 cm from the edge of the areola.    Impression & Recommendations:  Problem # 1:  BREAST MASS (ICD-611.72) Will refer to teh breast clinic for further evaluatio with WS and possible diagnostic mammogram.  Orders: T-*Unlisted Diagnostic X-ray test/procedure ([address])  Complete Medication List: 1)  Prednisone 10 Mg Tabs (Prednisone) .... Take as directed. 2)  Xanax 1 Mg Tabs (Alprazolam) .... One or two tabs at bedtime for insomnia 3)  Excedrin Migraine 250-250-65 Mg Tabs (Aspirin-acetaminophen-caffeine) .... As needed 4)  Seroquel 100 Mg Tabs (Quetiapine fumarate) .... Take 1 tablet by mouth once a day at bedtime 5)  Zovirax 400 Mg Tabs (Acyclovir) .... Take 1 tablet by mouth three times a day for 5 days as needed for flare. 6)  Tramadol Hcl 50 Mg Tabs (Tramadol hcl) .... Take 1 tablet by mouth three times a day as needed back pain. 7)  Phentermine Hcl 37.5 Mg Caps (Phentermine hcl) .... Take 1 tablet by mouth once a day 8)  Estradiol 1 Mg Tabs (Estradiol) .... Take 1 tablet by  mouth once a day for 21 days, then off for 7. then repeat. 9)  Tussionex Pennkinetic Er 10-8 Mg/52ml Lqcr (Hydrocod polst-chlorphen polst) .... 5ml by mouth at bedtime as needed cough   Orders Added: 1)  T-*Unlisted Diagnostic X-ray test/procedure [[id number]] 2)  Est. Patient Level III [[address]]

## 2010-09-09 ENCOUNTER — Telehealth: Payer: Self-pay | Admitting: Family Medicine

## 2010-09-15 NOTE — Progress Notes (Signed)
  Phone Note Call from Patient   Caller: Patient Call For: Nani Gasser MD Summary of Call: pt needs a refill for her prednisone for a 30 day supply Initial call taken by: Avon Gully CMA, Duncan Dull),  September 09, 2010 1:40 PM    Prescriptions: PREDNISONE 10 MG TABS (PREDNISONE) Take as directed.  #120 x 0   Entered by:   Avon Gully CMA, (AAMA)   Authorized by:   Nani Gasser MD   Signed by:   Avon Gully CMA, (AAMA) on 09/09/2010   Method used:   Electronically to        [facility] [phone]* (retail)       [address] [place], [place]  [address]       Ph: [phone]       Fax: [phone]   RxID:   [id number]

## 2010-10-07 ENCOUNTER — Telehealth: Payer: Self-pay | Admitting: Family Medicine

## 2010-10-12 NOTE — Progress Notes (Signed)
  Phone Note Refill Request Message from:  Patient  Refills Requested: Medication #1:  ZOVIRAX 400 MG TABS Take 1 tablet by mouth three times a day for 5 days as needed for flare. Initial call taken by: Avon Gully CMA, Duncan Dull),  October 07, 2010 4:07 PM    Prescriptions: ZOVIRAX 400 MG TABS (ACYCLOVIR) Take 1 tablet by mouth three times a day for 5 days as needed for flare.  #90 x 1   Entered and Authorized by:   Nani Gasser MD   Signed by:   Nani Gasser MD on 10/07/2010   Method used:   Electronically to        [facility] [phone]* (retail)       [address] [place], [place]  [address]       Ph: [phone]       Fax: [phone]   RxID:   [id number] ZOVIRAX 400 MG TABS (ACYCLOVIR) Take 1 tablet by mouth three times a day for 5 days as needed for flare.  #90 x 1   Entered by:   Avon Gully CMA, (AAMA)   Authorized by:   Nani Gasser MD   Signed by:   Avon Gully CMA, (AAMA) on 10/07/2010   Method used:   Electronically to        [facility] [phone]* (retail)       [address] [place], [place]  [address]       Ph: [phone]       Fax: [phone]   RxID:   [id number]

## 2010-12-06 ENCOUNTER — Encounter: Payer: Self-pay | Admitting: Family Medicine
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
## 2010-12-10 ENCOUNTER — Ambulatory Visit (INDEPENDENT_AMBULATORY_CARE_PROVIDER_SITE_OTHER): Payer: BC Managed Care – PPO | Admitting: Family Medicine

## 2010-12-10 ENCOUNTER — Encounter: Payer: Self-pay | Admitting: Family Medicine

## 2010-12-10 VITALS — BP 101/59 | HR 92 | Ht 65.0 in | Wt 135.0 lb

## 2010-12-10 DIAGNOSIS — E785 Hyperlipidemia, unspecified: Secondary | ICD-10-CM

## 2010-12-10 DIAGNOSIS — G4700 Insomnia, unspecified: Secondary | ICD-10-CM

## 2010-12-10 DIAGNOSIS — Z0000 Encounter for general adult medical examination without abnormal findings: Secondary | ICD-10-CM

## 2010-12-10 MED ORDER — AMPHETAMINE-DEXTROAMPHETAMINE 10 MG PO TABS: 10.0000 mg | ORAL_TABLET | Freq: Two times a day (BID) | ORAL | Status: DC

## 2010-12-10 NOTE — Assessment & Plan Note (Signed)
[facility]                           GASTROENTEROLOGY OFFICE NOTE   GLYNN, FREAS                     MRN:          [id number]  DATE:03/23/2006                            DOB:          19-Aug-1970    CHIEF COMPLAINT:  Right upper quadrant pain.   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  She relates  onset of her symptoms last evening after eating a dinner of spaghetti.  She  says that the pain started and has been constant since but varying in  intensity.  She said it had eased off some by bedtime and was more of a dull  ache; however, she was unable to get comfortable to sleep.  She says at  times it feels like a knife sticking in her back and does radiate into the  right shoulder blade area.  This morning, she continues to be uncomfortable  and has had nausea in waves but no vomiting.  She says she feels flushed but  has not had any chills or documented fever.  She has not had any diarrhea,  melena, or hematochezia.   The patient has not had any prior abdominal surgery.  She did have a vaginal  hysterectomy.  She had a CT scan of her abdomen and pelvis done  approximately 1 year ago which did not show any evidence of cholelithiasis.   She just started on a tapered course of prednisone and took 120 mg  yesterday.  She does this periodically for aphthous ulcers.   CURRENT MEDICATIONS:  Prednisone 120 mg initial dose with a taper and  colchicine t.i.d.   PHYSICAL EXAMINATION:  GENERAL:  Well-developed white female in no acute  distress.  VITAL SIGNS:  Temp is 98.  Vital signs are stable.  HEENT: Nontraumatic, normocephalic.  EOMI. PERRLA. Sclerae anicteric.  CARDIOVASCULAR:  Regular rate and rhythm with S1 and S2.  No murmur, rub, or  gallop.  PULMONARY:  Clear to A and P.  ABDOMEN:  Soft.  She is tender in the right upper quadrant with  some early  guarding.  There is no rebound.  Bowel sounds are active.  There is no CVA  tenderness and no rib or costal margin tenderness.  RECTAL:  Not done.   IMPRESSION:  67. A 40 year old white female with acute right upper quadrant pain with      radiation into the back and associated nausea, rule out biliary colic,      cholecystitis, peptic ulcer disease, medication induced gastritis.  2. History of ureterolithiasis.  3. History of oral aphthous ulcers.  4. Status post vaginal hysterectomy.   PLAN:  1. Check abdominal ultrasound today.  2. CBC with diff, CMET, amylase, lipase, and UA.  3. Offered pain medication.  She has Ultram and will use this on a p.r.n.      basis.  Also advised clear-liquid diet over the next 24 hours.  Amy Esterwood, PA-C                                Iva Boop, MD,FACG   AE/MedQ  DD:  03/31/2006  DT:  04/01/2006  Job #:  [id number]

## 2010-12-10 NOTE — Op Note (Signed)
NAME:  Patricia, GLAUSER NO.:  [id number]   MEDICAL RECORD NO.:  [id number]          PATIENT TYPE:  AMB   LOCATION:  DSC                          FACILITY:  [facility]   PHYSICIAN:  Loreta Ave, M.D. DATE OF BIRTH:  1971-05-20   DATE OF PROCEDURE:  11/11/2004  DATE OF DISCHARGE:                                 OPERATIVE REPORT   PREOPERATIVE DIAGNOSIS:  Subretinacular symptomatic ganglion, dorsal aspect  right wrist.   POSTOPERATIVE DIAGNOSIS:  Subretinacular symptomatic ganglion, dorsal aspect  right wrist.   PROCEDURE:  Excision, subretinacular ganglion, right wrist.   SURGEON:  Loreta Ave, M.D.   ASSISTANT:  Genene Churn. Denton Meek.   ANESTHESIA:  General.   ESTIMATED BLOOD LOSS:  Minimal.   TOURNIQUET TIME:  40 minutes.   SPECIMENS:  None.   CULTURES:  None.   COMPLICATIONS:  None.   DRESSING:  Soft compressive with a bulky hand dressing and splint.   PROCEDURE:  Patient brought to the operating room and after adequate  anesthesia had been obtained, the right wrist examined.  Full motion, good  stability.  An ill-defined 1 cm mass at the scaphoradiolunate interval that  was no obvious subcutaneous but appeared to be subretinacular.  The arm was  prepped and draped after a tourniquet applied to the proximal aspect of the  arm.  Prepped and draped in the usual sterile fashion.  Exsanguinated with  elevation and Esmarch and tourniquet inflated to 250 mmHg.  Transverse  incision at the radial side of the fourth dorsal compartment, avoiding  injury to the superficial branch of the radial nerve.  Skin and subcutaneous  tissue divided.  The bulging of the retinaculum in the region of the  ganglion was evident.  It was opened transversely to protect the retinacular  structure.  The ganglion, which was about 5-6 mm emanating off the dorsal  aspect of the wrist at the scaphoradiolunate interval at the radial margin  of the fourth compartment was  isolated, excised in its entirety, including  taking a small window out of the dorsal capsule where the ganglion was  coming from.  Some surrounding tenosynovitis of the fourth compartment  debrided.  All tendon units intact.  The joint inspected and all articular  cartilage surfaces intact without extension of interosseous ganglion.  The  wound irrigated.  Closed with nylon.  Margins of the wound were injected with Marcaine.  A sterile compressive  dressing with a bulky hand dressing and splint applied.  Tourniquet inflated  and removed.  Anesthesia reversed.  Brought to the recovery room.  Tolerated  the surgery well with no complications.      DFM/MEDQ  D:  11/11/2004  T:  11/11/2004  Job:  [id number]

## 2010-12-10 NOTE — Assessment & Plan Note (Signed)
Due to recheck lipids. 

## 2010-12-10 NOTE — Procedures (Signed)
[name] - [place]                            ABDOMINAL ULTRASOUND REPORT   [name], [name]                     MRN:          [id number]  DATE:03/23/2006                            DOB:          1970-10-30    REFERRING PHYSICIAN:  Iva Boop, MD, [facility]   ACCESSION NUMBER:  [id number]   READING PHYSICIAN: Malcom T. Russella Dar, MD, [facility]   PROCEDURE:  Multiplanar abdominal ultrasound imaging was performed in the  upright, supine, right and left lateral decubitus positions.   RESULTS:  Abdominal aorta 1.5 cm in maximal diameter.  The IVC is patent.   The pancreas appears normal throughout the head, body and tail without  evidence of ductal dilatation, pancreatic masses, or peripancreatic  inflammation.   In the gallbladder, there are thickened walls noted only on the upright view  but a slightly irregular appearance.  Wall thickness measures between 4-6  mm.  These findings are not noted on the supine view.  Gallbladder is well  distended with no pericholecystic fluid or intraluminal echogenic foci to  suggest gallstone disease.   The common bile duct measures 3.1 mm in maximal diameter without evidence of  intraluminal foci.   The liver appears normal without evidence of parenchymal lesion, ductal  dilatation or vascular abnormality.   The right kidney measures 10 cm, the left kidney 9.9 cm.  Kidneys are normal  in appearance.   Spleen is normal in size, measuring 9.1 cm without parenchymal lesion.   IMPRESSION:  1. Focally thickened gallbladder walls.  2. No other abnormalities noted.                                  Venita Lick. Pleas Koch., MD, [facility]   MTS/MedQ  DD:  03/23/2006 DT:  03/24/2006 Job #:  [id number]   cc:   Iva Boop, MD, [facility]

## 2010-12-10 NOTE — Progress Notes (Signed)
Subjective:    Patient ID: [name], female    DOB: [birth year], 40 y.o.   MRN: [id number]  HPI Stopped the xanax for sleep a couple of months ago. She says she is still not sleeping well. ONly getting about 3 hours but really doesn't want to be dependent on anything for sleep She also stopped her mood meds as well. Says her mood has been good. 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  She is still drinking a lot of caffeine.   Wants to discuss inability to focus. She has felt like this has been a problem on and off duing most of her life. Feels diorganized.  She will start multiple jobs/taks and then not finish them. Then will feel so overwhelmed she doesn't even know where to start. Feels hre mood is good. 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 She feels it is distressing to feel so disorganized. She is timely.  She would like to try something to see if it would her her focus.   She also has a form for work to prove she had a CPE and labs.    She thinks her Tdap is uptodate.    Review of Systems     Objective:   Physical Exam        Assessment & Plan:    Subjective:     [name] is a 40 y.o. female and is here for a comprehensive physical exam. The patient reports see above. . She says her lenses are fairly up-to-date.  History   Social History  . Marital Status: Divorced    Spouse Name: N/A    Number of Children: N/A  . 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 Smoking status: Current Everyday Smoker    Types: Cigarettes  . Smokeless tobacco: Not on file  . Alcohol Use: No  . Drug Use: No  . Sexually Active: Not on file   Other Topics Concern  . Not on file   Social History Narrative   Boyfriends name is Trey Paula. Daughter is Debbe Bales and  son is Patterson Hammersmith.    Health Maintenance  Topic Date Due  . Tetanus/tdap  01/05/1988    The following portions of the patient's history were reviewed and updated as appropriate: allergies, current medications, past family history, past medical history, past social history and past surgical history.  Review of Systems A comprehensive review of systems was negative.   Objective:    BP 101/59  Pulse 92  Ht 5\' 5"  (1.651 m)  Wt 135 lb (61.236 kg)  BMI 22.47 kg/m2 General appearance: alert, cooperative and appears stated age Head: Normocephalic, without obvious abnormality, atraumatic Eyes: Pupils equal round reactive to light and accommodation, extraocular movements are intact, conjunctiva are normal. Ears: normal TM's and external ear canals both ears Nose: Nares normal. Septum midline. Mucosa normal. No drainage or sinus tenderness. Throat: lips, mucosa, and tongue normal; teeth and gums normal Neck: no adenopathy, no carotid bruit, supple, symmetrical, trachea midline and thyroid not enlarged, symmetric, no tenderness/mass/nodules Back: symmetric, no curvature. ROM normal. No CVA tenderness. Lungs: clear to auscultation bilaterally Breasts: Normal appearance, no masses. She did have some slight tenderness over the right  outer breast. She has a history of breast tenderness. She also drinks a significant amount of caffeine. Heart: regular rate and rhythm, S1, S2 normal, no murmur, click, rub or gallop Abdomen: soft, non-tender; bowel sounds normal; no masses,  no organomegaly Extremities: extremities normal, atraumatic, no cyanosis or edema Pulses: 2+ and symmetric Skin: Skin color, texture, turgor normal. No rashes or lesions Lymph nodes: Cervical, supraclavicular, and axillary nodes normal. Neurologic: Grossly normal    Assessment:    Healthy female exam.   Plan:     See After Visit Summary for Counseling Recommendations  Lab slip given to do screening labs, fasting. She  does not need a mammogram as she has had a hysterectomy. She can start: Cancer screening at age 2. She can start mammogram between age 75 and 51. Her last mammogram was last year. Encourage regular exercise and healthy diet. We also discussed the importance of smoking cessation as well as cutting back on her caffeine.  Inability to focus-she may have attention deficit disorder. I would like to have her complete a full-strength full at her followup visit. In the meantime he can try short acting Adderall as a trial. She can take it twice a day with the second dose being no later than one p.m. I will need to monitor her weight difficulty cardiac symptoms she'll need to stop it immediately. Also recommend that she not mixed with large amounts of caffeine.

## 2010-12-10 NOTE — Assessment & Plan Note (Signed)
I congratulated hereon stopping the xanax. Work on Physiological scientist. Also need to stop caffeine intake before.

## 2010-12-10 NOTE — Op Note (Signed)
NAMEMarland Kitchen  Patricia, Hood NO.:  [id number]   MEDICAL RECORD NO.:  [id number]          PATIENT TYPE:  AMB   LOCATION:  DSC                          FACILITY:  [facility]   PHYSICIAN:  Loreta Ave, M.D. DATE OF BIRTH:  07/05/1971   DATE OF PROCEDURE:  06/03/2004  DATE OF DISCHARGE:                                 OPERATIVE REPORT   PREOPERATIVE DIAGNOSES:  Left knee chondromalacia patella with lateral  tracking and tethering.   POSTOPERATIVE DIAGNOSES:  Left knee chondromalacia patella with lateral  tracking and tethering with flap tear posterior horn lateral meniscus.   OPERATION PERFORMED:  Left knee examination under anesthesia, arthroscopy,  chondroplasty patella.  Partial lateral meniscectomy.  Lateral retinacular  release.   SURGEON:  Loreta Ave, M.D.   ASSISTANT:  Genene Churn. Denton Meek.   ANESTHESIA:  General.   ESTIMATED BLOOD LOSS:  Minimal.   TOURNIQUET:  Not employed.   SPECIMENS:  None.   CULTURES:  None.   COMPLICATIONS:  None.   DRESSING:  Soft compressive with lateral bolster.   DESCRIPTION OF PROCEDURE:  The patient was brought to the operating room and  placed on the operating table in supine position.  After adequate anesthesia  had been obtained, both knees examined.  Previous lateral release on the  right which still had a very nice release without tethering.  On the left,  patellofemoral, lateral tracking, lateral tethering and crepitus.  Stable  ligaments.  Tourniquet and leg holder applied.  Leg prepped and draped in  the usual sterile fashion.  Three portals were created, one superolateral,  one each medial and lateral parapatellar.  Inflow catheter introduced.  Knee  distended.  Arthroscope introduced.  Knee inspected.  Grade 2 and 3  chondromalacia relatively global on patella.  Some medial facet but also  fair amount lateral facet especially inferolateral.  Definite lateral  tracking and tethering.  Trochlea looked good.   Remaining articular  cartilage looked good.  Flap tear posterior horn lateral meniscus removed.  Tapered in smoothly.  Medial meniscus without tears.  Cruciate ligaments  intact.  After debridement of patella and meniscus, tracking thoroughly  assessed and felt to be sufficiently lateral to our release.  Cautery was  used to release the lateral retinaculum from the vastus lateralis superiorly  all the way down the lateral joint line.  Hemostasis with cautery.  After  lateral release, marked improvement of tethering and good tracking.  Very  acceptable release and the patella could be lifted off the lateral side  significantly confirming good  release.  Instruments and fluid removed.  Portals of the knee injected with  Marcaine.  Portals were closed with 4-0 nylon.  Sterile compressive dressing  applied.  Lateral bolster.  Anesthesia reversed.  Brought to recovery room.  Tolerated surgery well.  No complications.      [name]   DFM/MEDQ  D:  06/03/2004  T:  06/03/2004  Job:  [id number]

## 2010-12-10 NOTE — Op Note (Signed)
NAMEDANELI, BUTKIEWICZ NO.:  [id number]   MEDICAL RECORD NO.:  [id number]                   PATIENT TYPE:  AMB   LOCATION:  DSC                                  FACILITY:  [facility]   PHYSICIAN:  Loreta Ave, M.D.              DATE OF BIRTH:  [birth year]   DATE OF PROCEDURE:  01/23/2003  DATE OF DISCHARGE:                                 OPERATIVE REPORT   PREOPERATIVE DIAGNOSIS:  Chondromalacia of patella with lateral  patellofemoral tracking and tethering right knee.   POSTOPERATIVE DIAGNOSES:  Chondromalacia of patella with lateral  patellofemoral tracking and tethering right knee.  Lateral meniscal tear.   OPERATION PERFORMED:  Right knee examination under anesthesia, arthroscopy,  chondroplasty patella with partial lateral meniscectomy.  Lateral  retinacular release.   SURGEON:  Loreta Ave, M.D.   ASSISTANT:  Arlys John D. Petrarca, P.A.-C.   ANESTHESIA:  General.   ESTIMATED BLOOD LOSS:  Minimal.   SPECIMENS:  None.   CULTURES:  None.   COMPLICATIONS:  None.   DRESSING:  Soft compressive with lateral bolster.   DESCRIPTION OF PROCEDURE:  The patient was brought to the operating room and  placed on the operating table in supine position.  After adequate anesthesia  had been obtained, the right knee examined.  Lateral tracking, tethering and  crepitus.  Otherwise full motion and stable ligaments.  Tourniquet and leg  holder applied.  Leg prepped and draped in the usual sterile fashion.  Three  portals were created, one superolateral, one each medial and lateral  parapatellar.  Inflow catheter introduced.  Knee distended.  Arthroscope  introduced and knee inspected.  Focal extensive grade 3 chondromalacia,  lateral facet, treated with previous chondroplasty.  A little fraying still  at the margin that were further debrided off.  Confirmation of significant  tightening of the lateral retinaculum with tethering.  Trochlea looked  good.  Lateral meniscus flap tear.  Posterior horn debrided out, tapered in  smoothly.  Remaining articular cartilage, medial and lateral compartment.  Remaining medial and lateral meniscus as well as cruciate ligaments normal.  Arthroscope was switched over to the medial portal.  Lateral release from  the vastus lateralis superiorly down to the lateral joint line inferiorly.  Hemostasis with cautery.  With the release, I had a  marked improvement with tethering with significant improvement of tracking  as well.  Instruments and fluid removed.  Portals of the knee injected with  Marcaine.  Portals were closed with 4-0 nylon.  Sterile compressive dressing  applied.  Anesthesia reversed.  Brought to recovery room.  Tolerated surgery  well.  No complications.                                                Reuel Boom  Georg Ruddle, M.D.    DFM/MEDQ  D:  01/23/2003  T:  01/23/2003  Job:  [id number]

## 2010-12-22 ENCOUNTER — Telehealth: Payer: Self-pay | Admitting: *Deleted

## 2010-12-22 ENCOUNTER — Telehealth: Payer: Self-pay | Admitting: Family Medicine

## 2010-12-22 ENCOUNTER — Other Ambulatory Visit: Payer: Self-pay | Admitting: *Deleted

## 2010-12-22 LAB — COMPLETE METABOLIC PANEL WITH GFR
AST: 17 U/L (ref 0–37)
Alkaline Phosphatase: 74 U/L (ref 39–117)
BUN: 9 mg/dL (ref 6–23)
Creat: 0.67 mg/dL (ref 0.40–1.20)
GFR, Est Non African American: >60 mL/min (ref >60)

## 2010-12-22 LAB — LIPID PANEL
Cholesterol: 182 mg/dL (ref 0–200)
Total CHOL/HDL Ratio: 2.7 Ratio
Triglycerides: 53 mg/dL (ref <150)
VLDL: 11 mg/dL (ref 0–40)

## 2010-12-22 MED ORDER — METHYLPHENIDATE HCL 10 MG PO TABS: 10.0000 mg | ORAL_TABLET | Freq: Two times a day (BID) | ORAL | Status: AC

## 2010-12-22 MED ORDER — ACYCLOVIR 400 MG PO TABS: 400.0000 mg | ORAL_TABLET | Freq: Three times a day (TID) | ORAL | Status: DC

## 2010-12-22 NOTE — Telephone Encounter (Signed)
Called patient: Complete metabolic panel is normal. Overall cholesterol looks much better than last time. LDL is down to 103.

## 2010-12-22 NOTE — Telephone Encounter (Signed)
OK to pick up rx anytime.

## 2010-12-22 NOTE — Telephone Encounter (Signed)
Pt called and states the generic adderall was too expensive for her.Pharmacist recommended Methnylpmenitate 10mg  1 po BID. This would be much cheaper for her. Would like to pick this up tomorrow

## 2010-12-22 NOTE — Telephone Encounter (Signed)
Pt notified. Will put form up front

## 2011-01-17 ENCOUNTER — Other Ambulatory Visit: Payer: Self-pay | Admitting: *Deleted

## 2011-01-17 MED ORDER — TRAMADOL HCL 50 MG PO TABS: 50.0000 mg | ORAL_TABLET | Freq: Three times a day (TID) | ORAL | Status: DC | PRN

## 2011-01-17 MED ORDER — PREDNISONE 10 MG PO TABS: 10.0000 mg | ORAL_TABLET | Freq: Every day | ORAL | Status: DC

## 2011-03-25 ENCOUNTER — Other Ambulatory Visit: Payer: Self-pay | Admitting: *Deleted

## 2011-03-25 MED ORDER — ACYCLOVIR 400 MG PO TABS: 400.0000 mg | ORAL_TABLET | Freq: Three times a day (TID) | ORAL | Status: DC

## 2011-03-25 MED ORDER — PREDNISONE 10 MG PO TABS: 10.0000 mg | ORAL_TABLET | Freq: Every day | ORAL | Status: DC

## 2011-03-30 ENCOUNTER — Other Ambulatory Visit: Payer: Self-pay | Admitting: Family Medicine

## 2011-03-30 MED ORDER — PHENTERMINE HCL 37.5 MG PO CAPS: 37.5000 mg | ORAL_CAPSULE | ORAL | Status: DC

## 2011-03-30 NOTE — Progress Notes (Signed)
She went to refill on her phentermine. She is getting approximately 14 pounds since June and says she really hasn't changed any of her eating habits. In fact she's been eating fewer sugary foods. She feels that her symptoms are coming predominantly from perimenopause. She is also having some additional symptoms such as hot flashes. She has been on this medication in the past and tolerated it well. We'll refill her medication and recheck her weight and blood pressure in one month.

## 2011-04-20 ENCOUNTER — Ambulatory Visit (INDEPENDENT_AMBULATORY_CARE_PROVIDER_SITE_OTHER): Payer: 59 | Admitting: Family Medicine

## 2011-04-20 ENCOUNTER — Encounter: Payer: Self-pay | Admitting: Family Medicine

## 2011-04-20 VITALS — BP 118/71 | HR 68 | Wt 139.0 lb

## 2011-04-20 DIAGNOSIS — M76899 Other specified enthesopathies of unspecified lower limb, excluding foot: Secondary | ICD-10-CM

## 2011-04-20 DIAGNOSIS — M7070 Other bursitis of hip, unspecified hip: Secondary | ICD-10-CM

## 2011-04-20 MED ORDER — TRAMADOL HCL 50 MG PO TABS: 50.0000 mg | ORAL_TABLET | Freq: Three times a day (TID) | ORAL | Status: DC | PRN

## 2011-04-20 NOTE — Patient Instructions (Signed)
We will call you with your referral.  

## 2011-04-20 NOTE — Progress Notes (Signed)
  Subjective:    Patient ID: [name], female    DOB: [birth year], 40 y.o.   MRN: [id number]  HPI BIlat hip . Left worse than Right. Has been on and off for the last 4 years. Used to be just on the left but now pain in both.  Using IBU and tramadol. Worse over the last mo.  Worse if tries to stand for prolonged periods.  Worse going up and down step.   Today not as bad.  No new exercise routine.  Last injections were months ago and only worked for one day. She is taking her oral prednisone for her mouth ulcers.  Would like to see Dr. Charlann Boxer. Can radiate into her low back n to her into her thigh. Needs refill on tramadol.  Has done PT twice for it.    Review of Systems     Objective:   Physical Exam  Musculoskeletal:       Normal flexion, extension, side ending and rotation RT and LT.  Neg straight leg raise.  Hip, knee and ankle strength are 5/5.  She is very tender over the LT greater trochanter.  Hip abductor strength is 5/5.            Assessment & Plan:  Bilat Hip Bursits - will refer to Dr. Charlann Boxer.  Has failed injections and PT.  RF tramadol. Can continue OtC IBU as long as not causing GI irritation

## 2011-04-21 ENCOUNTER — Ambulatory Visit
Admission: RE | Admit: 2011-04-21 | Discharge: 2011-04-21 | Disposition: A | Discharge status: Home / Self Care | Payer: 59 | Source: Ambulatory Visit | Attending: Family Medicine | Admitting: Family Medicine

## 2011-04-21 ENCOUNTER — Telehealth: Payer: Self-pay | Admitting: Family Medicine

## 2011-04-21 DIAGNOSIS — M25522 Pain in left elbow: Secondary | ICD-10-CM

## 2011-04-21 NOTE — Telephone Encounter (Signed)
Hit elbow why turning around at target last night and now having difficulty straightening her elbow and it feels slightly numb and tingly. It is also swollen today.  She iced it last night.  More painful to straighten it.

## 2011-04-29 ENCOUNTER — Telehealth: Payer: Self-pay | Admitting: Family Medicine

## 2011-04-29 MED ORDER — PROMETHAZINE HCL 25 MG PO TABS: 25.0000 mg | ORAL_TABLET | Freq: Four times a day (QID) | ORAL | Status: DC | PRN

## 2011-04-29 NOTE — Telephone Encounter (Signed)
Feeling nauseated today.  Ate [employer] last night.  Will call in phenergan. Call if nto better in 24-48 hours.

## 2011-05-04 ENCOUNTER — Telehealth: Payer: Self-pay | Admitting: Family Medicine

## 2011-05-04 MED ORDER — LIDOCAINE HCL 2 % EX GEL: CUTANEOUS | Status: AC | PRN

## 2011-05-04 NOTE — Telephone Encounter (Signed)
Mouth sores again. Used to use lidocaine gel and worked wellin the past. Would like new rx send to [facility].

## 2011-05-06 ENCOUNTER — Other Ambulatory Visit: Payer: Self-pay | Admitting: Orthopedic Surgery

## 2011-05-06 ENCOUNTER — Ambulatory Visit
Admission: RE | Admit: 2011-05-06 | Discharge: 2011-05-06 | Disposition: A | Discharge status: Home / Self Care | Payer: 59 | Source: Ambulatory Visit | Attending: Orthopedic Surgery | Admitting: Orthopedic Surgery

## 2011-05-06 DIAGNOSIS — R102 Pelvic and perineal pain: Secondary | ICD-10-CM

## 2011-05-06 DIAGNOSIS — M25559 Pain in unspecified hip: Secondary | ICD-10-CM

## 2011-06-23 ENCOUNTER — Other Ambulatory Visit: Payer: Self-pay | Admitting: *Deleted

## 2011-06-23 MED ORDER — PHENTERMINE HCL 37.5 MG PO CAPS: 37.5000 mg | ORAL_CAPSULE | ORAL | Status: AC

## 2011-07-04 ENCOUNTER — Telehealth: Payer: Self-pay | Admitting: *Deleted

## 2011-07-04 MED ORDER — TRAMADOL HCL 50 MG PO TABS
50.0000 mg | ORAL_TABLET | Freq: Two times a day (BID) | ORAL | Status: DC
Start: 2011-07-04 — End: 2011-11-09

## 2011-07-06 ENCOUNTER — Encounter: Payer: Self-pay | Admitting: Family Medicine

## 2011-07-06 ENCOUNTER — Other Ambulatory Visit: Payer: Self-pay | Admitting: Family Medicine

## 2011-07-06 ENCOUNTER — Ambulatory Visit (INDEPENDENT_AMBULATORY_CARE_PROVIDER_SITE_OTHER): Payer: 59 | Admitting: Family Medicine

## 2011-07-06 VITALS — BP 132/72 | HR 97 | Wt 139.0 lb

## 2011-07-06 DIAGNOSIS — D239 Other benign neoplasm of skin, unspecified: Secondary | ICD-10-CM

## 2011-07-06 DIAGNOSIS — D229 Melanocytic nevi, unspecified: Secondary | ICD-10-CM

## 2011-07-06 NOTE — Progress Notes (Signed)
  Shave Biopsy Procedure Note  Pre-operative Diagnosis: Actinic keratosis  Post-operative Diagnosis: unkown  Locations: Right forehead Right inner canthal fold  Indications: Rule out squamous cell and basal cell  Anesthesia: Lidocaine 1% with epinephrine without added sodium bicarbonate  Procedure Details  History of allergy to iodine: no  Patient informed of the risks (including bleeding and infection) and benefits of the  procedure and Verbal informed consent obtained.  The lesion and surrounding area were given a sterile prep using betadyne and draped in the usual sterile fashion. A scalpel was used to shave an area of skin approximately 0.2 b 0.2 and 0.3 x 0.3 Achieved with alumuninum chloride. Antibiotic ointment and a sterile dressing applied.  The specimen was sent for pathologic examination. The patient tolerated the procedure well.  EBL: 0 ml  Findings: Unknown  Condition: Stable  Complications: none.  Plan: 1. Instructed to keep the wound dry and covered for 24-48h and clean thereafter. 2. Warning signs of infection were reviewed.   3. Recommended that the patient use NSAID as needed for pain.  4. Return in 1 week if not healing well.

## 2011-07-21 ENCOUNTER — Other Ambulatory Visit: Payer: Self-pay | Admitting: *Deleted

## 2011-07-21 MED ORDER — ACYCLOVIR 400 MG PO TABS: 400.0000 mg | ORAL_TABLET | Freq: Three times a day (TID) | ORAL | Status: DC

## 2011-07-22 ENCOUNTER — Other Ambulatory Visit: Payer: Self-pay | Admitting: *Deleted

## 2011-07-22 MED ORDER — PREDNISONE 10 MG PO TABS: 10.0000 mg | ORAL_TABLET | Freq: Every day | ORAL | Status: DC

## 2011-08-02 ENCOUNTER — Other Ambulatory Visit: Payer: Self-pay | Admitting: *Deleted

## 2011-08-02 MED ORDER — PHENTERMINE HCL 37.5 MG PO CAPS: 37.5000 mg | ORAL_CAPSULE | ORAL | Status: DC

## 2011-09-06 ENCOUNTER — Other Ambulatory Visit: Payer: Self-pay | Admitting: *Deleted

## 2011-09-06 MED ORDER — PHENTERMINE HCL 37.5 MG PO CAPS: 37.5000 mg | ORAL_CAPSULE | ORAL | Status: AC

## 2011-09-06 MED ORDER — ACYCLOVIR 400 MG PO TABS: 400.0000 mg | ORAL_TABLET | Freq: Three times a day (TID) | ORAL | Status: DC

## 2011-09-06 NOTE — Telephone Encounter (Signed)
Called phentermine into pharm because the ink is low on the printer and couldn't read rx

## 2011-09-08 ENCOUNTER — Other Ambulatory Visit: Payer: Self-pay | Admitting: Family Medicine

## 2011-09-08 DIAGNOSIS — Z1231 Encounter for screening mammogram for malignant neoplasm of breast: Secondary | ICD-10-CM

## 2011-09-27 ENCOUNTER — Ambulatory Visit (INDEPENDENT_AMBULATORY_CARE_PROVIDER_SITE_OTHER): Payer: 59 | Admitting: Family Medicine

## 2011-09-27 DIAGNOSIS — IMO0001 Reserved for inherently not codable concepts without codable children: Secondary | ICD-10-CM

## 2011-09-27 DIAGNOSIS — R350 Frequency of micturition: Secondary | ICD-10-CM

## 2011-09-27 DIAGNOSIS — N390 Urinary tract infection, site not specified: Secondary | ICD-10-CM

## 2011-09-27 LAB — POCT URINALYSIS DIPSTICK
Bilirubin, UA: NEGATIVE
Glucose, UA: 100
Nitrite, UA: NEGATIVE
Urobilinogen, UA: 0.2

## 2011-09-27 MED ORDER — CIPROFLOXACIN HCL 500 MG PO TABS: 500.0000 mg | ORAL_TABLET | Freq: Two times a day (BID) | ORAL | Status: AC

## 2011-09-27 NOTE — Progress Notes (Signed)
  Subjective:    Patient ID: [name], female    DOB: 1970/08/25, 41 y.o.   MRN: [id number] Urine check; frequent urination, starting this am. HPI    Review of Systems     Objective:   Physical Exam        Assessment & Plan:  UTI - no fever. Will tx with Cipro 500mg  bid x 3 days. Call if not better in one week. Low risk so no culture sent C. Linford Arnold, MD.

## 2011-11-09 ENCOUNTER — Telehealth: Payer: Self-pay | Admitting: Family Medicine

## 2011-11-09 ENCOUNTER — Ambulatory Visit (INDEPENDENT_AMBULATORY_CARE_PROVIDER_SITE_OTHER): Payer: 59 | Admitting: Family Medicine

## 2011-11-09 VITALS — BP 107/65 | HR 83 | Ht 65.0 in | Wt 137.0 lb

## 2011-11-09 DIAGNOSIS — M542 Cervicalgia: Secondary | ICD-10-CM

## 2011-11-09 DIAGNOSIS — M25519 Pain in unspecified shoulder: Secondary | ICD-10-CM

## 2011-11-09 DIAGNOSIS — R51 Headache: Secondary | ICD-10-CM

## 2011-11-09 MED ORDER — PROPRANOLOL HCL 40 MG PO TABS: 40.0000 mg | ORAL_TABLET | Freq: Two times a day (BID) | ORAL | Status: DC

## 2011-11-09 MED ORDER — AMPHETAMINE-DEXTROAMPHET ER 15 MG PO CP24: 15.0000 mg | ORAL_CAPSULE | ORAL | Status: DC

## 2011-11-09 MED ORDER — TRAMADOL HCL 50 MG PO TABS: 50.0000 mg | ORAL_TABLET | Freq: Two times a day (BID) | ORAL | Status: DC

## 2011-11-09 NOTE — Progress Notes (Signed)
Subjective:    Patient ID: [name], female    DOB: 1970-10-13, 41 y.o.   MRN: [id number]  HPI Chornic daily HA.  They are getting incapaciting. Relpax, flexeril doesn't help.  It feel like head is in a vice grip, worse on her right side. Light sensitivity.  Notices hard to talk at times because of pain. Has been trying to stretch out her neck.  Hears her neck grinding.  Right shoulder pain as well. Worse if lift heavy things with that right shoulder.  No nausea. Has tried changing her pillows. Hx of neuropahty. No worsenin of numbness or ingling in her hand. She does take steroid occ for he mouth ulcer but doesn't seem to help her HA. She gets her fianc to do massage on her neck every night and it does provide some relief for a short period time but then she wakes up with a headache and with her muscles and spasm again. She has tried even changing pillows.  No real alleviating sxs.     Review of Systems     Objective:   Physical Exam  Constitutional: She is oriented to person, place, and time. She appears well-developed and well-nourished.  Musculoskeletal:       Neck with normal flexion, extension, rotation. Dec side bending to the left compared to the right.  Non tender over the cervical spine. She is very tight and mildly tender over the right paracervical muscles and the right trapezius. Shoulders have normal range of motion but she does have a slight decrease from full extension on the right shoulder compared to left but she's able to get there. It is uncomfortable. Strength in the shoulders, elbows, wrists is over 5 bilaterally. Strength in the thumb and first finger is 5/5 bilaterally. She is nontender over the shoulder joint.  Neurological: She is alert and oriented to person, place, and time.  Skin: Skin is warm and dry.  Psychiatric: She has a normal mood and affect. Her behavior is normal.          Assessment & Plan:  Chronic daily headache-we discussed her options  including starting an TCA versus starting a beta blocker. We did discuss potential for weight gain with TCAs she would rather try a beta blocker. We'll start propranolol 40 mg twice a day. We'll see her back in 6 weeks. We also discussed that physical therapy will be very helpful for the non-tension in her neck and trapezius. This may even be causing some of her shoulder pain. She will change insurance is in about 2-3 weeks and would like to wait until that happens before we start physical therapy but she is very interested in doing that. Also there is a headache specialist in our building that she could certainly see to consider trigger point injections which can be extremely helpful. Her sleep quality is poor right now. We discussed trying to work on improved. She has tried melatonin but it does not work well. She was dependent on Xanax for almost 7 years to help her sleep but came off that about a year ago does not want to restart it. We also discussed avoiding caffeine. She does require caffeine during the day.  Shoulder pain, right-May be related to her neck and trapezius muscle spasms. She does have some pain with full extension to consider that she may have some mild rotator cuff issues well. Once we get her into physical therapy they can certainly work on the shoulder as well as if  this makes any difference.  ADD-she has tried methylphenidate in the past and felt like it didn't work very well she would like to try Adderall now that she has insurance again. However new prescription for the 15 mg capsule daily.  Polyarthritis-she would like a refill on her tramadol.

## 2011-11-09 NOTE — Telephone Encounter (Signed)
error 

## 2011-11-09 NOTE — Patient Instructions (Signed)
Tension Headache (Muscle Contraction Headache) Tension headache is one of the most common causes of head pain. These headaches are usually felt as a pain over the top of your head and back of your neck. Stress, anxiety, and depression are common triggers for these headaches. Tension headaches are not life-threatening and will not lead to other types of headaches. Tension headaches can often be diagnosed by taking a history from the patient and a physical exam. Sometimes, further lab and x-ray studies are used to confirm the diagnosis. Your caregiver can advise you on how to get help solving problems that cause anxiety or stress. Antidepressants can be prescribed if depression is a problem. HOME CARE INSTRUCTIONS   If testing was done, call for your results. Remember, it is your responsibility to get the results of all testing. Do not assume everything is fine because you do not hear from your caregiver.   Only take over-the-counter or prescription medicines for pain, discomfort, or fever as directed by your caregiver.   Biofeedback, massage, or other relaxation techniques may be helpful.   Ice packs or heat to the head and neck can be used. Use these three to four times per day or as needed.   Physical therapy may be a useful addition to treatment.   If headaches continue, even with therapy, you may need to think about lifestyle changes.   Avoid excessive use of pain killers, as rebound headaches can occur.  SEEK MEDICAL CARE IF:   You develop problems with medications prescribed.   You do not respond or get no relief from medications.   You have a change from the usual headache.   You develop nausea (feeling sick to your stomach) or vomiting.  SEEK IMMEDIATE MEDICAL CARE IF:   Your headache becomes severe.   You have an unexplained oral temperature above 102 F (38.9 C).   You develop a stiff neck.   You have loss of vision.   You have muscular weakness.   You have loss of  muscular control.   You develop severe symptoms different from your first symptoms.   You start losing your balance or have trouble walking.   You feel faint or pass out.  MAKE SURE YOU:   Understand these instructions.   Will watch your condition.   Will get help right away if you are not doing well or get worse.  Document Released: 07/11/2005 Document Revised: 06/30/2011 Document Reviewed: 02/28/2008 [employer] Patient Information 2012 [employer]. 

## 2011-11-24 ENCOUNTER — Other Ambulatory Visit: Payer: Self-pay | Admitting: *Deleted

## 2011-11-24 MED ORDER — PREDNISONE 10 MG PO TABS: 10.0000 mg | ORAL_TABLET | Freq: Every day | ORAL | Status: DC

## 2011-11-30 ENCOUNTER — Ambulatory Visit (INDEPENDENT_AMBULATORY_CARE_PROVIDER_SITE_OTHER): Payer: 59 | Admitting: Family Medicine

## 2011-11-30 ENCOUNTER — Encounter: Payer: Self-pay | Admitting: Family Medicine

## 2011-11-30 VITALS — BP 106/69 | HR 87 | Ht 65.0 in | Wt 140.0 lb

## 2011-11-30 DIAGNOSIS — F17200 Nicotine dependence, unspecified, uncomplicated: Secondary | ICD-10-CM

## 2011-11-30 DIAGNOSIS — R51 Headache: Secondary | ICD-10-CM

## 2011-11-30 DIAGNOSIS — Z720 Tobacco use: Secondary | ICD-10-CM

## 2011-11-30 DIAGNOSIS — G4700 Insomnia, unspecified: Secondary | ICD-10-CM

## 2011-11-30 MED ORDER — TRAZODONE HCL 50 MG PO TABS: 50.0000 mg | ORAL_TABLET | Freq: Every day | ORAL | Status: DC

## 2011-11-30 NOTE — Patient Instructions (Signed)

## 2011-11-30 NOTE — Progress Notes (Signed)
Subjective:    Patient ID: [name], female    DOB: 1971-03-08, 41 y.o.   MRN: [id number]  HPI HA - overall her headaches are significantly improved. She is very ecstatic with the progress on the propranolol. Unfortunately she feels very lethargic and fatigued on the dose. She says she doesn't even want to really do very much. She's also had an increase recently in her hot flashes and wonders if that could be a medication side effect as well. She has been following her blood pressure at work and has been ranging from the mid 90s to the low 100s. No dizziness or chest pain or short of breath.  Insomnia-she is still struggling with sleep. She really is mostly having problems with falling asleep. She says she has right hip and right shoulder pain and that is her side she likes to sleep on. That makes it very difficult for her to be able to relax and get comfortable. She says when she finally does fall asleep she typically will stay asleep the rest of the night. She is to take Xanax on a regular basis to help her but really wanted to get off of that particular medication. She also want to stay away from Ambien and Lunesta both of which she has tried in the past as well and did not like the side effects.  Tobacco abuse-she says she knows she needs to quit smoking but is not quite ready. She recently got married and her husband is not a smoker. She says she has quit several times in her life. The longest was for 4 years. At that time she chewed on straws and Rubin Payor when she would get a craving. At that time and did work well but eventually she started smoking again. She know she needs to make the change, but isn't sure how to do it. She is not interested in any of the medication such as Chantix her Wellbutrin. She has tried nicotine replacement in the past but says she tried it during a time when she was not really committed to quitting smoking.  Review of Systems     Objective:   Physical  Exam  Constitutional: She is oriented to person, place, and time. She appears well-developed and well-nourished.  HENT:  Head: Normocephalic and atraumatic.  Cardiovascular: Normal rate, regular rhythm and normal heart sounds.   Pulmonary/Chest: Effort normal and breath sounds normal.  Neurological: She is alert and oriented to person, place, and time.  Skin: Skin is warm and dry.  Psychiatric: She has a normal mood and affect. Her behavior is normal.          Assessment & Plan:  HA - controlled. Much improved from last time. Unfortunately she is expensing side effects from the medication. We will try a half a tab twice a day which would decrease her to 20 mg of propranolol twice a day. Hopefully this will still help control her headaches but keep her fatigue at a minimum.  Insomnia-we discussed options of possibly trazodone or Rozerem. She would like to start trazodone 50 mg. Start with half a tab at bedtime for the first week and if she needs to increase to a whole tablet or even up to 2 tablets she can. Warned of sedation in the morning. Call if any concerns or problems.  Tobacco abuse-we discussed different strategies to reduce her smoking. She says she feels she is more of an all or nothing type person. We did discuss nicotine replacement  as a possibility to aid in at least helping her with the cravings. We also discussed coming up with a strategy before a quit date on how to deal with stressful emotions, having a backup plan. Mentally she is slowly getting there is not quite ready to quit.

## 2011-12-14 ENCOUNTER — Telehealth: Payer: Self-pay | Admitting: *Deleted

## 2011-12-14 MED ORDER — AMPHETAMINE-DEXTROAMPHET ER 25 MG PO CP24: 25.0000 mg | ORAL_CAPSULE | ORAL | Status: DC

## 2011-12-14 NOTE — Telephone Encounter (Signed)
Pt request to increase the dose of Adderall. She can tell a difference with the 15mg  but she states she could do better if the dose was increased

## 2011-12-14 NOTE — Telephone Encounter (Signed)
OK, lets try a 25mg  dose.  Will print new rx.

## 2012-01-12 ENCOUNTER — Other Ambulatory Visit: Payer: Self-pay | Admitting: *Deleted

## 2012-01-12 MED ORDER — TRAZODONE HCL 50 MG PO TABS: 50.0000 mg | ORAL_TABLET | Freq: Every day | ORAL | Status: DC

## 2012-01-12 MED ORDER — PROPRANOLOL HCL 40 MG PO TABS: 40.0000 mg | ORAL_TABLET | Freq: Two times a day (BID) | ORAL | Status: DC

## 2012-01-12 MED ORDER — AMPHETAMINE-DEXTROAMPHET ER 25 MG PO CP24: 25.0000 mg | ORAL_CAPSULE | ORAL | Status: DC

## 2012-01-31 ENCOUNTER — Other Ambulatory Visit: Payer: Self-pay | Admitting: Family Medicine

## 2012-01-31 ENCOUNTER — Ambulatory Visit (INDEPENDENT_AMBULATORY_CARE_PROVIDER_SITE_OTHER): Payer: 59

## 2012-01-31 ENCOUNTER — Ambulatory Visit: Payer: 59

## 2012-01-31 DIAGNOSIS — Z1231 Encounter for screening mammogram for malignant neoplasm of breast: Secondary | ICD-10-CM

## 2012-01-31 DIAGNOSIS — M79673 Pain in unspecified foot: Secondary | ICD-10-CM

## 2012-01-31 NOTE — Progress Notes (Signed)
She would like referral to ortho for foot pain. Has seen podiatry in the past.

## 2012-02-10 ENCOUNTER — Other Ambulatory Visit: Payer: Self-pay | Admitting: *Deleted

## 2012-02-10 MED ORDER — TRAMADOL HCL 50 MG PO TABS: 50.0000 mg | ORAL_TABLET | Freq: Two times a day (BID) | ORAL | Status: DC

## 2012-02-14 ENCOUNTER — Other Ambulatory Visit: Payer: Self-pay | Admitting: *Deleted

## 2012-02-14 MED ORDER — AMPHETAMINE-DEXTROAMPHET ER 25 MG PO CP24: 25.0000 mg | ORAL_CAPSULE | ORAL | Status: DC

## 2012-02-14 MED ORDER — ACYCLOVIR 400 MG PO TABS: 400.0000 mg | ORAL_TABLET | Freq: Three times a day (TID) | ORAL | Status: DC

## 2012-02-14 MED ORDER — PROPRANOLOL HCL 40 MG PO TABS: 40.0000 mg | ORAL_TABLET | Freq: Two times a day (BID) | ORAL | Status: DC

## 2012-05-03 ENCOUNTER — Ambulatory Visit (INDEPENDENT_AMBULATORY_CARE_PROVIDER_SITE_OTHER): Payer: 59 | Admitting: Family Medicine

## 2012-05-03 ENCOUNTER — Encounter: Payer: Self-pay | Admitting: Family Medicine

## 2012-05-03 VITALS — BP 108/67 | HR 76 | Wt 138.0 lb

## 2012-05-03 DIAGNOSIS — G2581 Restless legs syndrome: Secondary | ICD-10-CM

## 2012-05-03 DIAGNOSIS — G589 Mononeuropathy, unspecified: Secondary | ICD-10-CM

## 2012-05-03 DIAGNOSIS — G4700 Insomnia, unspecified: Secondary | ICD-10-CM

## 2012-05-03 DIAGNOSIS — G629 Polyneuropathy, unspecified: Secondary | ICD-10-CM

## 2012-05-03 MED ORDER — ALPRAZOLAM 1 MG PO TABS: 1.0000 mg | ORAL_TABLET | Freq: Every evening | ORAL | Status: DC | PRN

## 2012-05-03 NOTE — Progress Notes (Signed)
  Subjective:    Patient ID: [name], female    DOB: [birth year], 41 y.o.   MRN: [id number]  HPI Insomnia - Says has tried OTC meds.  Even tried a colon clense.  Says used to take xanax for sleep.  Says the trazodone doesn's twork well and makes her neuropathy worse. Says her hips keep her awake bc of her bursitis.  Says her legs feel like she has to move them at night. Feels better when she moves them. Says getting in warm bath does help.  Had a knot in the muscle in her right calf last time. Her husband was able to massage out. She denies any recent or abnormal leg cramping.   Review of Systems     Objective:   Physical Exam  Constitutional: She appears well-developed and well-nourished.  Musculoskeletal:       No LE edema or swelling.    Skin: Skin is warm and dry.  Psychiatric: She has a normal mood and affect.          Assessment & Plan:  [name] is Re: tried Ambien, Lunesta and feels like the trazodone makes her neuropathy worse. At this point time if it's okay to restart the Xanax. She's been off of her long time hopefully she can just take a half or maybe even a quarter of a tab. Try to use sparingly and as needed.  Restless leg syndrome-we'll need to rule out B12 deficiency first. She does have a prior history of this. If it's abnormal we can correct this if not then consider treating him with a medication such as Requip. If we can improve this may help with the sleep as well.

## 2012-05-04 ENCOUNTER — Telehealth: Payer: Self-pay | Admitting: Family Medicine

## 2012-05-04 DIAGNOSIS — L679 Hair color and hair shaft abnormality, unspecified: Secondary | ICD-10-CM

## 2012-05-04 DIAGNOSIS — L853 Xerosis cutis: Secondary | ICD-10-CM

## 2012-05-04 DIAGNOSIS — G4700 Insomnia, unspecified: Secondary | ICD-10-CM

## 2012-05-04 DIAGNOSIS — R232 Flushing: Secondary | ICD-10-CM

## 2012-05-04 LAB — VITAMIN B12: Vitamin B-12: 352 pg/mL (ref 211–911)

## 2012-05-04 LAB — CBC
MCV: 88.1 fL (ref 78.0–100.0)
Platelets: 332 10*3/uL (ref 150–400)
RBC: 4.53 MIL/uL (ref 3.87–5.11)
RDW: 13.4 % (ref 11.5–15.5)
WBC: 8.8 10*3/uL (ref 4.0–10.5)

## 2012-05-04 NOTE — Telephone Encounter (Signed)
orders

## 2012-05-05 LAB — PROGESTERONE: Progesterone: <0.2 ng/mL

## 2012-05-05 LAB — ESTRADIOL: Estradiol: 160.4 pg/mL

## 2012-05-05 LAB — FOLLICLE STIMULATING HORMONE: FSH: 38.2 m[IU]/mL

## 2012-05-05 LAB — VITAMIN D 25 HYDROXY (VIT D DEFICIENCY, FRACTURES): Vit D, 25-Hydroxy: 24 ng/mL — ABNORMAL LOW (ref 30–89)

## 2012-05-07 ENCOUNTER — Other Ambulatory Visit: Payer: Self-pay | Admitting: Family Medicine

## 2012-05-07 DIAGNOSIS — G4700 Insomnia, unspecified: Secondary | ICD-10-CM

## 2012-05-07 MED ORDER — AMBULATORY NON FORMULARY MEDICATION: Status: DC

## 2012-05-09 ENCOUNTER — Other Ambulatory Visit: Payer: Self-pay | Admitting: *Deleted

## 2012-05-09 MED ORDER — AMPHETAMINE-DEXTROAMPHET ER 25 MG PO CP24: 25.0000 mg | ORAL_CAPSULE | ORAL | Status: DC

## 2012-05-16 ENCOUNTER — Other Ambulatory Visit: Payer: Self-pay | Admitting: *Deleted

## 2012-05-16 MED ORDER — TRAMADOL HCL 50 MG PO TABS: 50.0000 mg | ORAL_TABLET | Freq: Two times a day (BID) | ORAL | Status: DC

## 2012-06-08 ENCOUNTER — Other Ambulatory Visit: Payer: Self-pay | Admitting: *Deleted

## 2012-06-08 MED ORDER — ALPRAZOLAM 1 MG PO TABS: 1.0000 mg | ORAL_TABLET | Freq: Every evening | ORAL | Status: DC | PRN

## 2012-06-12 ENCOUNTER — Telehealth: Payer: Self-pay | Admitting: Family Medicine

## 2012-06-12 MED ORDER — OSELTAMIVIR PHOSPHATE 75 MG PO CAPS: 75.0000 mg | ORAL_CAPSULE | Freq: Every day | ORAL | Status: DC

## 2012-06-12 NOTE — Telephone Encounter (Signed)
Patient called. Her daughter was just diagnosed with influenza. She has been around her and her strength after her and is very worried. We discussed we can certainly prophylactic Tamiflu. Patient agrees.

## 2012-06-15 ENCOUNTER — Other Ambulatory Visit: Payer: Self-pay | Admitting: Family Medicine

## 2012-06-15 MED ORDER — AMPHETAMINE-DEXTROAMPHET ER 25 MG PO CP24: 25.0000 mg | ORAL_CAPSULE | ORAL | Status: DC

## 2012-07-10 ENCOUNTER — Other Ambulatory Visit: Payer: Self-pay | Admitting: *Deleted

## 2012-07-10 MED ORDER — ALPRAZOLAM 1 MG PO TABS: 1.0000 mg | ORAL_TABLET | Freq: Every evening | ORAL | Status: DC | PRN

## 2012-07-11 ENCOUNTER — Ambulatory Visit (INDEPENDENT_AMBULATORY_CARE_PROVIDER_SITE_OTHER): Payer: 59 | Admitting: Family Medicine

## 2012-07-11 ENCOUNTER — Encounter: Payer: Self-pay | Admitting: Family Medicine

## 2012-07-11 VITALS — BP 117/69 | HR 56 | Ht 65.0 in | Wt 133.0 lb

## 2012-07-11 DIAGNOSIS — H539 Unspecified visual disturbance: Secondary | ICD-10-CM

## 2012-07-11 DIAGNOSIS — F411 Generalized anxiety disorder: Secondary | ICD-10-CM

## 2012-07-11 DIAGNOSIS — F329 Major depressive disorder, single episode, unspecified: Secondary | ICD-10-CM

## 2012-07-11 DIAGNOSIS — R209 Unspecified disturbances of skin sensation: Secondary | ICD-10-CM

## 2012-07-11 DIAGNOSIS — R200 Anesthesia of skin: Secondary | ICD-10-CM

## 2012-07-11 DIAGNOSIS — R413 Other amnesia: Secondary | ICD-10-CM

## 2012-07-11 DIAGNOSIS — F32A Depression, unspecified: Secondary | ICD-10-CM

## 2012-07-11 DIAGNOSIS — R6882 Decreased libido: Secondary | ICD-10-CM

## 2012-07-11 MED ORDER — BUPROPION HCL ER (XL) 150 MG PO TB24: 150.0000 mg | ORAL_TABLET | Freq: Every day | ORAL | Status: DC

## 2012-07-11 NOTE — Progress Notes (Signed)
Subjective:    Patient ID: [name], female    DOB: [birth year], 41 y.o.   MRN: [id number]  HPI Says having a lot of memory problems x 3 months.  She feels mentally sluggish.  Says has noticed changes in peripheral vision in both eyes.. Has an eye appt in Jan.  Not sure if menopause or hormone related.  Has been on progesterone for 2 months and has noticed more vaginal d/c, which she does not like.  Says work has been very stressed.  Sleeping ok on the xanax.  Feels very irritable and says doesn't feel happy.  No sex drive.  Says doesn't want to leave the house. She just doesn't want to start affecting her marriage. She says otherwise she is very happy in her relationship with her husband. The only big stressor for her is work.  Taking 1mg  on the xanax but still not sleeping great! Neck stays so tight that is it triggering her HA.  Finger will go numb and tingle.  Her legs have been better since I last saw her. She says since stopping the trazodone the restless leg symptoms have completely resolved.   She's also noticed tingling in both hands. It mostly in the third fourth and fifth digits of both hands. It will sometimes happen in the day. She'll notice it when she's driving. Sometimes she will just put her hand in a pocket and noticed it. It doesn't seem to bother her too much at night. She says she just feels like it's going numb. No actual weakness. No significant neck pain but she says she's having a lot of pain in her upper back and trapezius muscles. She says she can constantly feel not across her back. She does not want to see a chiropractor. She knows it's mostly from tension. That's where she didn't take her hypertension is in her upper back. She's not tried massage therapy. She will get her husband to rub her shoulders at times it does seem to help.  Review of Systems Has been feeling nauseated for about 3 months. No loss of smell. No loss of taste. No sore throat. No chest pain or  short of breath.    Objective:   Physical Exam  Constitutional: She is oriented to person, place, and time. She appears well-developed and well-nourished.  HENT:  Head: Normocephalic and atraumatic.  Right Ear: External ear normal.  Left Ear: External ear normal.  Nose: Nose normal.  Mouth/Throat: Oropharynx is clear and moist.       TMs and canals are clear.   Eyes: Conjunctivae normal and EOM are normal. Pupils are equal, round, and reactive to light.  Neck: Neck supple. No thyromegaly present.  Cardiovascular: Normal rate, regular rhythm and normal heart sounds.        Radial pulses 2+ bilaterally.  Pulmonary/Chest: Effort normal and breath sounds normal. She has no wheezes.  Musculoskeletal: She exhibits no edema.       Neck with normal range of motion. Nontender over the cervical spine. Negative Tinel's and Phalen's signs on both wrists. Wrist with normal range of motion.  Lymphadenopathy:    She has no cervical adenopathy.  Neurological: She is alert and oriented to person, place, and time. She has normal reflexes. No cranial nerve deficit. Coordination normal.       Alert and oriented.  CN 2-12 intact.  Neg rhomberg. Normal rapid alternating movements of hands. Reflexes symmetric in the UE and LE.  Normal knee to ankle, down  the shin bilaterally.  No tremor.     Skin: Skin is warm and dry.  Psychiatric: She has a normal mood and affect. Her behavior is normal. Judgment and thought content normal.          Assessment & Plan:  Memory loss-unclear etiology at this point in time. She had normal B12 and thyroid levels. We will add additional thiamine, folic acid, HIV, RPR that we would typically do for a early dementia workup. Certainly her memory loss could be a lack of concentration due to depression or anxiety. PHQ 9 score is 16. Gad 7 score is 14. We definitely need to keep this in consideration. I'm also concerned because she's having difficulty with peripheral vision in both  eyes. This started about the same time as the memory loss. Thus I would like to move forward with an MRI of the brain to rule out any brain pathology. At least we know that this is normal but it may be more of a hormonal or mood issue. Certainly I do think she is going through menopause. We will recheck her hormone levels and she is on progesterone cream. She is not a good candidate for estrogen because of her family history of breast cancer. She is a smoker, which certainly puts her at risk of vascular issues that she is very young for this.  Peripheral vision disturbance-she has an appointment already in January with the eye doctor. Encouraged her to keep this.  Depression/anxiety-I do think she scores significantly enough on both screening tests that she most likely does have both of these. I think starting a medication that will not have sexual side effects as good place to start. We will start with Wellbutrin 50 mg release once a day and followup in one month. Warned about potential side effects. Call if any concerns or issues.  Insomnia-she still struggling with sleep. She did not do well on the trazodone. In fact since she has stopped it her restless leg symptoms have resolved. She still using the Xanax as needed which does seem to help. This certainly could be mood and stress related.  Decreased libido-hopefully improving her mood will help this.  Upper back pain/trapezius strain-discussed using a rice heat pack every night when she gets home from work to relax the muscles. They work on gentle stretch of motion and stretches. We also discussed that it's possible she could have carpal tunnel syndrome as well. She did have a negative Tinel's and Phalen's sign but certainly she could. 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 This is fairly new. We can certainly can consider a trial of a cock-up splint on the left wrist to wear just at night  for 2-3 weeks to see if her symptoms improve. If they do not then we consider that the numbness and tingling in her hands may be coming from her neck. Continue anti-inflammatory as needed. She has been using it periodically already. Also discussed trial of massage therapy weekly for 4-6 weeks to see if this makes a big difference. If not improving then consider physical therapy or possibly imaging.  Numbness in hands-please see above. Distribution is mostly in the area of the ulnar and part of the median nerve.

## 2012-07-13 ENCOUNTER — Ambulatory Visit (INDEPENDENT_AMBULATORY_CARE_PROVIDER_SITE_OTHER): Payer: 59

## 2012-07-13 DIAGNOSIS — H539 Unspecified visual disturbance: Secondary | ICD-10-CM

## 2012-07-13 DIAGNOSIS — R413 Other amnesia: Secondary | ICD-10-CM

## 2012-07-13 LAB — COMPLETE METABOLIC PANEL WITH GFR
AST: 15 U/L (ref 0–37)
Alkaline Phosphatase: 69 U/L (ref 39–117)
GFR, Est Non African American: >89 mL/min
Glucose, Bld: 94 mg/dL (ref 70–99)
Sodium: 136 mEq/L (ref 135–145)
Total Bilirubin: 0.7 mg/dL (ref 0.3–1.2)
Total Protein: 7.8 g/dL (ref 6.0–8.3)

## 2012-07-13 LAB — FOLLICLE STIMULATING HORMONE: FSH: 20.2 m[IU]/mL

## 2012-07-13 LAB — PROGESTERONE: Progesterone: 1.2 ng/mL

## 2012-07-13 LAB — HIV ANTIBODY (ROUTINE TESTING W REFLEX): HIV: NONREACTIVE

## 2012-07-13 LAB — CORTISOL-AM, BLOOD: Cortisol - AM: 10.5 ug/dL (ref 4.3–22.4)

## 2012-07-13 LAB — LUTEINIZING HORMONE: LH: 38.4 m[IU]/mL

## 2012-07-17 LAB — VITAMIN B1: Vitamin B1 (Thiamine): 38 nmol/L — ABNORMAL HIGH (ref 8–30)

## 2012-07-23 ENCOUNTER — Encounter: Payer: Self-pay | Admitting: Family Medicine

## 2012-07-23 ENCOUNTER — Ambulatory Visit (INDEPENDENT_AMBULATORY_CARE_PROVIDER_SITE_OTHER): Payer: 59 | Admitting: Family Medicine

## 2012-07-23 VITALS — BP 109/68 | HR 70 | Temp 97.5°F | Resp 18 | Wt 138.0 lb

## 2012-07-23 DIAGNOSIS — J329 Chronic sinusitis, unspecified: Secondary | ICD-10-CM

## 2012-07-23 DIAGNOSIS — J111 Influenza due to unidentified influenza virus with other respiratory manifestations: Secondary | ICD-10-CM

## 2012-07-23 DIAGNOSIS — J029 Acute pharyngitis, unspecified: Secondary | ICD-10-CM

## 2012-07-23 DIAGNOSIS — Z0289 Encounter for other administrative examinations: Secondary | ICD-10-CM

## 2012-07-23 MED ORDER — AMOXICILLIN-POT CLAVULANATE 875-125 MG PO TABS: 1.0000 | ORAL_TABLET | Freq: Two times a day (BID) | ORAL | Status: DC

## 2012-07-23 MED ORDER — HYDROCOD POLST-CHLORPHEN POLST 10-8 MG/5ML PO LQCR: 5.0000 mL | Freq: Two times a day (BID) | ORAL | Status: DC | PRN

## 2012-07-23 NOTE — Progress Notes (Signed)
  Subjective:    Patient ID: [name], female    DOB: April 14, 1971, 41 y.o.   MRN: [id number]  HPI Sick for 6 days. Went to UC and swabbed for strep and flu and both were neg.  Also told had right OM and sinusitis.  + cough, bodyaches, +fever.  Cough is productive but says it is sitting in her chest.  No energy.  + severe HA.  Put on zpack, tessalon perles, flonase.  Had allergic reaction to tessalon perles, had rash on her face.  Taking her prednisone, Aleve, IBU rotating every 4 hours.  Mucinex.  No GI sxs.  Still very ST. + sick contacts. HA actually started 2 days before the illness.  Had is across her forehead. Notices right eye is more puffy. C/O lots of pressure behind the eyes. Some drianage from the right eye as well.    Review of Systems     Objective:   Physical Exam  Constitutional: She is oriented to person, place, and time. She appears well-developed and well-nourished.  HENT:  Head: Normocephalic and atraumatic.  Right Ear: External ear normal.  Left Ear: External ear normal.  Nose: Nose normal.  Mouth/Throat: Oropharynx is clear and moist.       TMs and canals are clear. Right upper lid is swollen. Trace fluid in the right ear. Erythematous spotches on the soft palate.   Eyes: Conjunctivae normal and EOM are normal. Pupils are equal, round, and reactive to light.  Neck: Neck supple. No thyromegaly present.  Cardiovascular: Normal rate, regular rhythm and normal heart sounds.   Pulmonary/Chest: Effort normal and breath sounds normal. She has no wheezes.  Lymphadenopathy:    She has no cervical adenopathy.  Neurological: She is alert and oriented to person, place, and time.  Skin: Skin is warm and dry.  Psychiatric: She has a normal mood and affect. Her behavior is normal.          Assessment & Plan:  Flu like illness  - her symptoms are strongly suspicious of the fluid. She did have negative and she has had quadrant occlusion of this year. She could have just  a flulike illness. At this point she's had the symptoms for 6 days. Expected course of 10-14 days. Continues in therapy care. Cough syrup given. She says she's taking Tussionex in the past and has done well with that without any significant side effects or problems. Recommend go home and get lots of rest drink plenty of fluids stay hydrated. Call if becomes more short of breath or fevers persist for 2 more days.  Sinusitis  - she does complain of severe pressure behind both eyes in addition to some drainage from the right eye. At this point she has had fever for 6 days which is a little unusual for the flu this am going to go ahead and put her on Augmentin to cover for sinusitis. Call still having fever within 2 days.  Acute pharyngitis-repeat strep was neg.  symtomatic care.

## 2012-07-23 NOTE — Patient Instructions (Signed)
Influenza, Adult Influenza ("the flu") is a viral infection of the respiratory tract. It occurs more often in winter months because people spend more time in close contact with one another. Influenza can make you feel very sick. Influenza easily spreads from person to person (contagious). CAUSES   Influenza is caused by a virus that infects the respiratory tract. You can catch the virus by breathing in droplets from an infected person's cough or sneeze. You can also catch the virus by touching something that was recently contaminated with the virus and then touching your mouth, nose, or eyes. SYMPTOMS   Symptoms typically last 4 to 10 days and may include:  Fever.   Chills.   Headache, body aches, and muscle aches.   Sore throat.   Chest discomfort and cough.   Poor appetite.   Weakness or feeling tired.   Dizziness.   Nausea or vomiting.  DIAGNOSIS   Diagnosis of influenza is often made based on your history and a physical exam. A nose or throat swab test can be done to confirm the diagnosis. RISKS AND COMPLICATIONS You may be at risk for a more severe case of influenza if you smoke cigarettes, have diabetes, have chronic heart disease (such as heart failure) or lung disease (such as asthma), or if you have a weakened immune system. Elderly people and pregnant women are also at risk for more serious infections. The most common complication of influenza is a lung infection (pneumonia). Sometimes, this complication can require emergency medical care and may be life-threatening. PREVENTION   An annual influenza vaccination (flu shot) is the best way to avoid getting influenza. An annual flu shot is now routinely recommended for all adults in the [place] TREATMENT   In mild cases, influenza goes away on its own. Treatment is directed at relieving symptoms. For more severe cases, your caregiver may prescribe antiviral medicines to shorten the sickness. Antibiotic medicines are not effective,  because the infection is caused by a virus, not by bacteria. HOME CARE INSTRUCTIONS  Only take over-the-counter or prescription medicines for pain, discomfort, or fever as directed by your caregiver.   Use a cool mist humidifier to make breathing easier.   Get plenty of rest until your temperature returns to normal. This usually takes 3 to 4 days.   Drink enough fluids to keep your urine clear or pale yellow.   Cover your mouth and nose when coughing or sneezing, and wash your hands well to avoid spreading the virus.   Stay home from work or school until your fever has been gone for at least 1 full day.  SEEK MEDICAL CARE IF:    You have chest pain or a deep cough that worsens or produces more mucus.   You have nausea, vomiting, or diarrhea.  SEEK IMMEDIATE MEDICAL CARE IF:    You have difficulty breathing, shortness of breath, or your skin or nails turn bluish.   You have severe neck pain or stiffness.   You have a severe headache, facial pain, or earache.   You have a worsening or recurring fever.   You have nausea or vomiting that cannot be controlled.  MAKE SURE YOU:  Understand these instructions.   Will watch your condition.   Will get help right away if you are not doing well or get worse.  Document Released: 07/08/2000 Document Revised: 01/10/2012 Document Reviewed: 10/10/2011 [employer] Patient Information 2013 [employer].    

## 2012-08-01 ENCOUNTER — Other Ambulatory Visit: Payer: Self-pay | Admitting: *Deleted

## 2012-08-01 MED ORDER — PROPRANOLOL HCL 40 MG PO TABS: 40.0000 mg | ORAL_TABLET | Freq: Two times a day (BID) | ORAL | Status: DC

## 2012-08-08 ENCOUNTER — Telehealth: Payer: Self-pay | Admitting: *Deleted

## 2012-08-08 MED ORDER — HYDROCOD POLST-CHLORPHEN POLST 10-8 MG/5ML PO LQCR: 5.0000 mL | Freq: Two times a day (BID) | ORAL | Status: DC | PRN

## 2012-08-08 NOTE — Telephone Encounter (Signed)
Pt states still coughing at night adn keeping her up. She would like to know if we can refill her cough syrup. Please advise.

## 2012-08-10 ENCOUNTER — Other Ambulatory Visit: Payer: Self-pay | Admitting: Family Medicine

## 2012-08-10 MED ORDER — ALPRAZOLAM 1 MG PO TABS: 1.0000 mg | ORAL_TABLET | Freq: Every evening | ORAL | Status: DC | PRN

## 2012-08-10 MED ORDER — CARISOPRODOL 350 MG PO TABS: 350.0000 mg | ORAL_TABLET | Freq: Two times a day (BID) | ORAL | Status: DC | PRN

## 2012-08-10 MED ORDER — AMPHETAMINE-DEXTROAMPHET ER 25 MG PO CP24: 25.0000 mg | ORAL_CAPSULE | ORAL | Status: DC

## 2012-08-10 MED ORDER — PREDNISONE 10 MG PO TABS: 10.0000 mg | ORAL_TABLET | Freq: Every day | ORAL | Status: DC

## 2012-08-10 NOTE — Progress Notes (Signed)
Pt requesting stronger muscle relaxer. Having a lot of tension in her shoulders and neck and it is literally waking her up at night with HA.  Has gone for a massage and helped but worse again last night. Will change to [facility].

## 2012-08-16 ENCOUNTER — Other Ambulatory Visit: Payer: Self-pay | Admitting: *Deleted

## 2012-08-16 MED ORDER — TRAMADOL HCL 50 MG PO TABS: 50.0000 mg | ORAL_TABLET | Freq: Two times a day (BID) | ORAL | Status: DC

## 2012-08-16 MED ORDER — BUPROPION HCL ER (XL) 150 MG PO TB24: 150.0000 mg | ORAL_TABLET | Freq: Every day | ORAL | Status: DC

## 2012-09-19 ENCOUNTER — Encounter: Payer: Self-pay | Admitting: Family Medicine

## 2012-09-19 ENCOUNTER — Ambulatory Visit (INDEPENDENT_AMBULATORY_CARE_PROVIDER_SITE_OTHER): Payer: 59 | Admitting: Family Medicine

## 2012-09-19 VITALS — BP 105/71 | HR 64 | Ht 65.0 in | Wt 132.0 lb

## 2012-09-19 DIAGNOSIS — F418 Other specified anxiety disorders: Secondary | ICD-10-CM

## 2012-09-19 DIAGNOSIS — R1012 Left upper quadrant pain: Secondary | ICD-10-CM

## 2012-09-19 DIAGNOSIS — N951 Menopausal and female climacteric states: Secondary | ICD-10-CM

## 2012-09-19 DIAGNOSIS — G43909 Migraine, unspecified, not intractable, without status migrainosus: Secondary | ICD-10-CM | POA: Insufficient documentation

## 2012-09-19 DIAGNOSIS — F341 Dysthymic disorder: Secondary | ICD-10-CM

## 2012-09-19 DIAGNOSIS — R232 Flushing: Secondary | ICD-10-CM

## 2012-09-19 MED ORDER — VENLAFAXINE HCL ER 37.5 MG PO CP24: ORAL_CAPSULE | ORAL | Status: DC

## 2012-09-19 NOTE — Progress Notes (Signed)
Subjective:    Patient ID: [name], female    DOB: 1971-07-21, 42 y.o.   MRN: [id number]  HPI Occ LUQ pain. Lasts for minutes. Not nec related to food/eating. She says typically after the pain starts it wraps around from the left upper quadrant to the left side of her back. If she feels very nauseated. The sensation eventually subsides. But does feel nauseated after eating some times. Usually about 20 minutes after eating. No prior history of GI ulcers. She takes occasional Excedrin Migraine but not frequently. She does use prednisone fairly frequently at low doses for mouth ulcers. No blood in the stool. No vomiting.  Mood-she feels that the Wellbutrin is not working. It was originally started because of some depression and anxiety type symptoms. She feels like it's really not helping her. In fact she has not wanted to do things that she normally enjoys. And when she does go she feels like she like her affect is flat. Given her husband has asked her multiple times if she's doing okay her having a good time. She says she feels like internally that she is but this isn't really able to showed excitement. She notices she is having more difficulty engaging in talking at work. Normally she is very outgoing person. She's also more recently been having frequent hot flashes. They have now been during the day as well as at night. She is on progesterone to help with sleep. She is on any estrogen. We recently checked hormone levels which were fairly normal. She wants to know if her Adderall could be aggravating some of her symptoms. She still has decreased sexual drive. She feels like she still really struggling with her memory. It is difficult for her to stay organized and to follow conversation sometimes.   She's also been having more frequent headaches. These were previously under great control with propranolol.   Review of Systems     Objective:   Physical Exam  Constitutional: She is oriented to  person, place, and time. She appears well-developed and well-nourished.  HENT:  Head: Normocephalic and atraumatic.  Eyes: Conjunctivae and EOM are normal.  Cardiovascular: Normal rate.   Pulmonary/Chest: Effort normal.  Neurological: She is alert and oriented to person, place, and time.  Skin: Skin is dry. No pallor.  Psychiatric: She has a normal mood and affect. Her behavior is normal.          Assessment & Plan:  Depression/anxiety-we will stop the Wellbutrin. She's very been off of it for at least 3 days and so she notices absolutely no difference. We will start Effexor 37.5 and then increase to 75 mg after one week. I would like to see her back in about 4 weeks with it we can see she's tolerating it well and adjust her regimen. I explained that this may also help with some of her hot flashes that she's been experiencing in addition to her mood. I'm not sure if the Wellbutrin is either making her too flat or if it's just not working.  Left upper quadrant pain-intermittent. I would like to check pancreatic enzymes. I suspect with her prednisone use that she certainly could be expecting some gastritis or even an ulcer. I would like her to start a PPI for at least the next 10-14 days and see if it makes an improvement in her symptoms. Also consider could be a side effect of the Wellbutrin.  Migraines-recently well-controlled at the last few weeks. Maybe aggravated by menopause. She  has some on the propranolol up until recently. Make sure drinking plan fluids and staying well hydrated since she doesn't have lower blood pressure to begin with. She has noticed occasional dizziness especially when she sneezes. Really work on increasing fluids.  Hot flashes-secondary menopause. Hopefully the Effexor will help. I explained that we'll not completely resolved hot flashes but ultimately hope for 50% improvement.

## 2012-09-20 LAB — AMYLASE: Amylase: 39 U/L (ref 0–105)

## 2012-09-20 LAB — COMPLETE METABOLIC PANEL WITH GFR
BUN: 11 mg/dL (ref 6–23)
CO2: 25 mEq/L (ref 19–32)
Calcium: 9.7 mg/dL (ref 8.4–10.5)
Chloride: 104 mEq/L (ref 96–112)
Creat: 0.83 mg/dL (ref 0.50–1.10)
GFR, Est African American: >89 mL/min
GFR, Est Non African American: 87 mL/min
Glucose, Bld: 91 mg/dL (ref 70–99)

## 2012-09-20 LAB — CBC WITH DIFFERENTIAL/PLATELET
Basophils Absolute: 0 10*3/uL (ref 0.0–0.1)
Basophils Relative: 0 % (ref 0–1)
Eosinophils Absolute: 0.1 10*3/uL (ref 0.0–0.7)
MCHC: 34.7 g/dL (ref 30.0–36.0)
Neutro Abs: 3.9 10*3/uL (ref 1.7–7.7)
Neutrophils Relative %: 54 % (ref 43–77)
RDW: 14.1 % (ref 11.5–15.5)

## 2012-09-20 LAB — LIPASE: Lipase: 14 U/L (ref 0–75)

## 2012-09-26 ENCOUNTER — Ambulatory Visit (INDEPENDENT_AMBULATORY_CARE_PROVIDER_SITE_OTHER): Payer: Federal, State, Local not specified - PPO | Admitting: Sports Medicine

## 2012-09-26 ENCOUNTER — Ambulatory Visit (INDEPENDENT_AMBULATORY_CARE_PROVIDER_SITE_OTHER): Payer: Federal, State, Local not specified - PPO

## 2012-09-26 DIAGNOSIS — M542 Cervicalgia: Secondary | ICD-10-CM

## 2012-09-26 DIAGNOSIS — G8929 Other chronic pain: Secondary | ICD-10-CM

## 2012-09-26 DIAGNOSIS — M47812 Spondylosis without myelopathy or radiculopathy, cervical region: Secondary | ICD-10-CM | POA: Insufficient documentation

## 2012-09-26 DIAGNOSIS — IMO0001 Reserved for inherently not codable concepts without codable children: Secondary | ICD-10-CM

## 2012-09-26 MED ORDER — ORPHENADRINE CITRATE ER 100 MG PO TB12: 100.0000 mg | ORAL_TABLET | Freq: Two times a day (BID) | ORAL | Status: DC

## 2012-09-26 NOTE — Patient Instructions (Addendum)
Prednisone 50 mg daily for 5 days. Orphenadrine. Please get aggressive with the rehabilitation exercises. C-spine x-ray. Trigger point injections were given in the office.

## 2012-09-26 NOTE — Progress Notes (Signed)
  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 She has unfortunately had several years of pain she localizes along the right side of her paracervical musculature over her right shoulder.  Occasionally the pain does radiate down her arm in a C8 distribution. The pain also tends to radiate over the posterior aspect of her occiput often mimicking a migraine. The pain is worse with extension, and is fairly constant and daily. She has been treated extensively for migraines, but has not had the cervical spine evaluated. She did have an MRI in 2008 but symptoms were not present then.  She is also already tried gabapentin, Lyrica, Flexeril, Skelaxin and none of these have worked. I give her a trigger point injection, single, approximately a month or so ago she did have 3 days of pain relief. She denies current phonophobia, photophobia, or nausea. Denies any head trauma. She did have a recent MRI of her brain which was negative.  Past medical history, Surgical history, Family history not pertinant except as noted below, Social history, Allergies, and medications have been entered into the medical record, reviewed, and no changes needed.   Review of Systems: No headache, visual changes, nausea, vomiting, diarrhea, constipation, dizziness, abdominal pain, skin rash, fevers, chills, night sweats, weight loss, swollen lymph nodes, body aches, joint swelling, muscle aches, chest pain, shortness of breath, mood changes, visual or auditory hallucinations.   Objective:   General: Well Developed, well nourished, and in no acute distress.  Neuro/Psych: Alert and oriented x3, extra-ocular muscles intact, able to move all 4 extremities, sensation grossly intact. Skin: Warm and dry, no rashes noted.  Respiratory: Not using accessory muscles, speaking in full sentences, trachea midline.  Cardiovascular: Pulses palpable, no extremity  edema. Abdomen: Does not appear distended. Neck: Inspection unremarkable. Tender to palpation in several discrete locations along the paracervical as well as right trapezius musculature. Negative Spurling's maneuver. Full neck range of motion Grip strength and sensation normal in bilateral hands Strength good C4 to T1 distribution No sensory change to C4 to T1 Negative Hoffman sign bilaterally Reflexes normal  Procedure:  Injection of right paracervical trigger points x3. Consent obtained and verified. Time-out conducted. Noted no overlying erythema, induration, or other signs of local infection. Skin prepped in a sterile fashion. Topical analgesic spray: Ethyl chloride. Completed without difficulty. Meds: A total of 1 cc Kenalog 40 and 4 cc lidocaine were divided between 3 painful trigger points along the paracervical as well as trapezius muscle on the right side. Pain immediately improved suggesting accurate placement of the medication. Advised to call if fevers/chills, erythema, induration, drainage, or persistent bleeding. Impression and Recommendations:   This case required medical decision making of moderate complexity.

## 2012-09-26 NOTE — Assessment & Plan Note (Addendum)
With very marked trapezial right-sided spasm. The certainly may be contributing to cervical cephalalgia. Trigger point injections as above. Prednisone for 5 days, orphenadrine, continue home exercises, C-spine x-rays. If no better concerned consider Zanaflex, and that would certainly like another MRI at some point considering her most recent one is 42 years old. Symptoms have changed since her last MRI. Formal physical therapy.

## 2012-09-27 ENCOUNTER — Other Ambulatory Visit: Payer: Self-pay | Admitting: *Deleted

## 2012-09-27 MED ORDER — AMPHETAMINE-DEXTROAMPHET ER 25 MG PO CP24: 25.0000 mg | ORAL_CAPSULE | ORAL | Status: DC

## 2012-09-27 NOTE — Addendum Note (Signed)
Addended by: Monica Becton on: 09/27/2012 10:16 AM   Modules accepted: Orders

## 2012-10-08 ENCOUNTER — Other Ambulatory Visit: Payer: Self-pay | Admitting: *Deleted

## 2012-10-08 MED ORDER — ACYCLOVIR 400 MG PO TABS
400.0000 mg | ORAL_TABLET | Freq: Three times a day (TID) | ORAL | Status: DC
Start: 2012-10-08 — End: 2013-02-18

## 2012-10-08 MED ORDER — ALPRAZOLAM 1 MG PO TABS: 1.0000 mg | ORAL_TABLET | Freq: Every evening | ORAL | Status: DC | PRN

## 2012-10-09 ENCOUNTER — Ambulatory Visit: Payer: Federal, State, Local not specified - PPO | Admitting: Physical Therapy

## 2012-10-10 ENCOUNTER — Ambulatory Visit: Payer: Federal, State, Local not specified - PPO | Admitting: Physical Therapy

## 2012-10-10 DIAGNOSIS — G8929 Other chronic pain: Secondary | ICD-10-CM | POA: Diagnosis not present

## 2012-10-10 DIAGNOSIS — M25619 Stiffness of unspecified shoulder, not elsewhere classified: Secondary | ICD-10-CM | POA: Diagnosis not present

## 2012-10-10 DIAGNOSIS — M542 Cervicalgia: Secondary | ICD-10-CM | POA: Diagnosis not present

## 2012-10-10 DIAGNOSIS — M6281 Muscle weakness (generalized): Secondary | ICD-10-CM | POA: Diagnosis not present

## 2012-10-12 ENCOUNTER — Telehealth: Payer: Self-pay | Admitting: Family Medicine

## 2012-10-12 DIAGNOSIS — R110 Nausea: Secondary | ICD-10-CM

## 2012-10-12 DIAGNOSIS — R1013 Epigastric pain: Secondary | ICD-10-CM

## 2012-10-12 MED ORDER — SUCRALFATE 1 GM/10ML PO SUSP: 1.0000 g | Freq: Four times a day (QID) | ORAL | Status: DC

## 2012-10-12 NOTE — Telephone Encounter (Signed)
Patient called and said she still having a lot of epigastric pain. She's been taking the Exelon for about a week it really hasn't seemed to help. It's worse after she eats. She gets extremely nauseated and wants to vomit. It feels like it than knowing sensation in her abdomen. Typically she will try to lay down. She's also been using lots of Maalox in between it does provide some temporary relief. She does use prednisone frequently which is a risk factor for gastric ulcer. At this point time out like to schedule her for an ultrasound to evaluate for gallstones in her gallbladder in addition to adding Carafate to her PPI to better coat her stomach before meals.  Recent labs show no sign of pancreatitis. If the ultrasound is normal then will refer to gastroneurology for further evaluation for possible endoscopy for gastric ulcer.

## 2012-10-15 ENCOUNTER — Ambulatory Visit (INDEPENDENT_AMBULATORY_CARE_PROVIDER_SITE_OTHER): Payer: Federal, State, Local not specified - PPO | Admitting: Physician Assistant

## 2012-10-15 ENCOUNTER — Ambulatory Visit (INDEPENDENT_AMBULATORY_CARE_PROVIDER_SITE_OTHER): Payer: Federal, State, Local not specified - PPO

## 2012-10-15 ENCOUNTER — Encounter: Payer: Self-pay | Admitting: Physician Assistant

## 2012-10-15 ENCOUNTER — Encounter: Payer: Federal, State, Local not specified - PPO | Admitting: Physical Therapy

## 2012-10-15 VITALS — BP 129/76 | HR 68 | Wt 131.0 lb

## 2012-10-15 DIAGNOSIS — R110 Nausea: Secondary | ICD-10-CM

## 2012-10-15 DIAGNOSIS — R1011 Right upper quadrant pain: Secondary | ICD-10-CM

## 2012-10-15 DIAGNOSIS — R1013 Epigastric pain: Secondary | ICD-10-CM

## 2012-10-15 DIAGNOSIS — R1012 Left upper quadrant pain: Secondary | ICD-10-CM

## 2012-10-15 LAB — POCT URINALYSIS DIPSTICK
Protein, UA: NEGATIVE
Spec Grav, UA: 1.015
pH, UA: 5.5

## 2012-10-15 MED ORDER — PROMETHAZINE HCL 25 MG PO TABS: 25.0000 mg | ORAL_TABLET | Freq: Four times a day (QID) | ORAL | Status: DC | PRN

## 2012-10-15 NOTE — Patient Instructions (Addendum)
Continue carafate, phenergan. Start tramadol as needed for abdominal pain up to every 6-8 hours. Will call with results.

## 2012-10-15 NOTE — Progress Notes (Signed)
  Subjective:    Patient ID: [name], female    DOB: [birth year], 42 y.o.   MRN: [id number]  HPI Patient presents to the clinic with ongoing right and left upper quadrant pain with significant abdominal pain and nausea. She has not vomited but feels like she is going too. Phenergan is helping significantly. Had pelvic ultrasound that was done this morning and normal. There was a lot of pain associated with [place] this am.Pain ongoing for over a month but worse over weekend. Started carafate with Dexilant with not relief. She cannot eat anything. She does take a lot of prednisone and Dr. Linford Arnold suspected gastric ulcer. She denies any bowl changes, fever, lower abdominal pain. She has eliminated caffeine out of diet with no relief. She had a HIDA scan done years ago and remembers the function being borderline. Not taking anything for pain. Being really still makes better.     Review of Systems     Objective:   Physical Exam  Constitutional: She is oriented to person, place, and time. She appears well-developed and well-nourished.  HENT:  Head: Normocephalic and atraumatic.  Cardiovascular: Normal rate, regular rhythm and normal heart sounds.   Pulmonary/Chest: Effort normal and breath sounds normal. She has no wheezes.  CVA tenderness bilaterally.  Abdominal: Soft. Bowel sounds are normal.  Tenderness and guarding over RUQ and LUQ and flank bilaterally.   Neurological: She is alert and oriented to person, place, and time.  Skin: Skin is warm and dry.  Psychiatric: She has a normal mood and affect. Her behavior is normal.          Assessment & Plan:  LUQpain/RUQpain/Nausea- UA showed trace blood will send for culture. Pancreatic enzymes were negative.  Will order h.pylori, CBC, HIDA scan. And get GI referral for endoscopy. Continue with phenergan, dexilant, carafate and add tramadol for pain relief as needed up to every 8 hours. Will put a STAT on referral. Will call with results.  Call with any worsening symptoms.

## 2012-10-16 ENCOUNTER — Other Ambulatory Visit: Payer: Self-pay | Admitting: Internal Medicine

## 2012-10-16 DIAGNOSIS — R110 Nausea: Secondary | ICD-10-CM

## 2012-10-16 DIAGNOSIS — R109 Unspecified abdominal pain: Secondary | ICD-10-CM

## 2012-10-16 LAB — CBC
MCH: 31.4 pg (ref 26.0–34.0)
MCHC: 34.8 g/dL (ref 30.0–36.0)
MCV: 90.1 fL (ref 78.0–100.0)
Platelets: 245 10*3/uL (ref 150–400)
RDW: 14.1 % (ref 11.5–15.5)

## 2012-10-17 ENCOUNTER — Ambulatory Visit (INDEPENDENT_AMBULATORY_CARE_PROVIDER_SITE_OTHER): Payer: Federal, State, Local not specified - PPO

## 2012-10-17 ENCOUNTER — Other Ambulatory Visit: Payer: Self-pay | Admitting: Internal Medicine

## 2012-10-17 DIAGNOSIS — R319 Hematuria, unspecified: Secondary | ICD-10-CM

## 2012-10-17 DIAGNOSIS — R110 Nausea: Secondary | ICD-10-CM

## 2012-10-17 DIAGNOSIS — R109 Unspecified abdominal pain: Secondary | ICD-10-CM

## 2012-10-18 LAB — HELICOBACTER PYLORI  ANTIBODY, IGM: Helicobacter pylori, IgM: 3.7 U/mL (ref <9.0)

## 2012-10-19 ENCOUNTER — Encounter (INDEPENDENT_AMBULATORY_CARE_PROVIDER_SITE_OTHER): Payer: Federal, State, Local not specified - PPO | Admitting: Physical Therapy

## 2012-10-19 ENCOUNTER — Encounter: Payer: Self-pay | Admitting: *Deleted

## 2012-10-19 ENCOUNTER — Other Ambulatory Visit: Payer: Self-pay

## 2012-10-19 DIAGNOSIS — M542 Cervicalgia: Secondary | ICD-10-CM

## 2012-10-19 DIAGNOSIS — G8929 Other chronic pain: Secondary | ICD-10-CM

## 2012-10-19 DIAGNOSIS — M6281 Muscle weakness (generalized): Secondary | ICD-10-CM

## 2012-10-19 DIAGNOSIS — M25619 Stiffness of unspecified shoulder, not elsewhere classified: Secondary | ICD-10-CM

## 2012-10-19 MED ORDER — VENLAFAXINE HCL ER 37.5 MG PO CP24: ORAL_CAPSULE | ORAL | Status: DC

## 2012-10-22 ENCOUNTER — Encounter (INDEPENDENT_AMBULATORY_CARE_PROVIDER_SITE_OTHER): Payer: Federal, State, Local not specified - PPO | Admitting: Physical Therapy

## 2012-10-22 ENCOUNTER — Ambulatory Visit (INDEPENDENT_AMBULATORY_CARE_PROVIDER_SITE_OTHER): Payer: Federal, State, Local not specified - PPO

## 2012-10-22 ENCOUNTER — Telehealth: Payer: Self-pay | Admitting: Family Medicine

## 2012-10-22 DIAGNOSIS — G8929 Other chronic pain: Secondary | ICD-10-CM

## 2012-10-22 DIAGNOSIS — M6281 Muscle weakness (generalized): Secondary | ICD-10-CM

## 2012-10-22 DIAGNOSIS — R319 Hematuria, unspecified: Secondary | ICD-10-CM

## 2012-10-22 DIAGNOSIS — M25619 Stiffness of unspecified shoulder, not elsewhere classified: Secondary | ICD-10-CM

## 2012-10-22 DIAGNOSIS — R1012 Left upper quadrant pain: Secondary | ICD-10-CM

## 2012-10-22 DIAGNOSIS — R1011 Right upper quadrant pain: Secondary | ICD-10-CM

## 2012-10-22 DIAGNOSIS — R112 Nausea with vomiting, unspecified: Secondary | ICD-10-CM

## 2012-10-22 DIAGNOSIS — M542 Cervicalgia: Secondary | ICD-10-CM

## 2012-10-22 MED ORDER — IOHEXOL 300 MG/ML  SOLN
100.0000 mL | Freq: Once | INTRAMUSCULAR | Status: AC | PRN
  Administered 2012-10-22: 100 mL via INTRAVENOUS

## 2012-10-22 NOTE — Telephone Encounter (Signed)
Patient came by .  Abdominal pain was better on Sat and then worse yesterday. Started vomiting again.  Schedule for endoscopy on Friday bc of transport issues.  Wouldl ike to move forward with Ct. Still on PPI and carafate and not improving. No pre-cert required. Will get pelvis and abd since also have persistant hematuria.

## 2012-10-25 ENCOUNTER — Encounter: Payer: Self-pay | Admitting: *Deleted

## 2012-10-26 ENCOUNTER — Encounter: Payer: Federal, State, Local not specified - PPO | Admitting: Physical Therapy

## 2012-10-29 ENCOUNTER — Encounter: Payer: Federal, State, Local not specified - PPO | Admitting: Physical Therapy

## 2012-11-01 ENCOUNTER — Encounter (INDEPENDENT_AMBULATORY_CARE_PROVIDER_SITE_OTHER): Payer: Federal, State, Local not specified - PPO | Admitting: Physical Therapy

## 2012-11-01 DIAGNOSIS — M542 Cervicalgia: Secondary | ICD-10-CM

## 2012-11-01 DIAGNOSIS — M6281 Muscle weakness (generalized): Secondary | ICD-10-CM

## 2012-11-01 DIAGNOSIS — G8929 Other chronic pain: Secondary | ICD-10-CM

## 2012-11-01 DIAGNOSIS — M25619 Stiffness of unspecified shoulder, not elsewhere classified: Secondary | ICD-10-CM

## 2012-11-05 ENCOUNTER — Encounter (INDEPENDENT_AMBULATORY_CARE_PROVIDER_SITE_OTHER): Payer: Federal, State, Local not specified - PPO | Admitting: Physical Therapy

## 2012-11-05 DIAGNOSIS — M6281 Muscle weakness (generalized): Secondary | ICD-10-CM

## 2012-11-05 DIAGNOSIS — M542 Cervicalgia: Secondary | ICD-10-CM

## 2012-11-05 DIAGNOSIS — M25619 Stiffness of unspecified shoulder, not elsewhere classified: Secondary | ICD-10-CM

## 2012-11-05 DIAGNOSIS — G8929 Other chronic pain: Secondary | ICD-10-CM

## 2012-11-07 ENCOUNTER — Encounter: Payer: Self-pay | Admitting: Family Medicine

## 2012-11-08 ENCOUNTER — Encounter: Payer: Federal, State, Local not specified - PPO | Admitting: Physical Therapy

## 2012-11-08 ENCOUNTER — Other Ambulatory Visit: Payer: Self-pay | Admitting: *Deleted

## 2012-11-08 DIAGNOSIS — G8929 Other chronic pain: Secondary | ICD-10-CM

## 2012-11-08 DIAGNOSIS — M542 Cervicalgia: Secondary | ICD-10-CM

## 2012-11-08 DIAGNOSIS — M6281 Muscle weakness (generalized): Secondary | ICD-10-CM

## 2012-11-08 DIAGNOSIS — M25619 Stiffness of unspecified shoulder, not elsewhere classified: Secondary | ICD-10-CM

## 2012-11-08 MED ORDER — ALPRAZOLAM 1 MG PO TABS: 1.0000 mg | ORAL_TABLET | Freq: Every evening | ORAL | Status: DC | PRN

## 2012-11-08 MED ORDER — AMPHETAMINE-DEXTROAMPHET ER 25 MG PO CP24: 25.0000 mg | ORAL_CAPSULE | ORAL | Status: DC

## 2012-11-08 MED ORDER — PROPRANOLOL HCL 40 MG PO TABS: 40.0000 mg | ORAL_TABLET | Freq: Two times a day (BID) | ORAL | Status: DC

## 2012-11-20 ENCOUNTER — Other Ambulatory Visit: Payer: Self-pay | Admitting: *Deleted

## 2012-11-20 MED ORDER — VENLAFAXINE HCL ER 75 MG PO CP24: 75.0000 mg | ORAL_CAPSULE | Freq: Every day | ORAL | Status: DC

## 2012-11-28 ENCOUNTER — Encounter: Payer: Self-pay | Admitting: Family Medicine

## 2012-12-06 ENCOUNTER — Other Ambulatory Visit: Payer: Self-pay | Admitting: Family Medicine

## 2012-12-06 ENCOUNTER — Other Ambulatory Visit: Payer: Self-pay | Admitting: *Deleted

## 2012-12-06 MED ORDER — ALPRAZOLAM 1 MG PO TABS: 1.0000 mg | ORAL_TABLET | Freq: Every evening | ORAL | Status: DC | PRN

## 2012-12-07 ENCOUNTER — Other Ambulatory Visit: Payer: Self-pay | Admitting: Sports Medicine

## 2012-12-07 MED ORDER — CARISOPRODOL 350 MG PO TABS: ORAL_TABLET | ORAL | Status: DC

## 2012-12-25 ENCOUNTER — Other Ambulatory Visit: Payer: Self-pay | Admitting: *Deleted

## 2012-12-25 DIAGNOSIS — G8929 Other chronic pain: Secondary | ICD-10-CM

## 2012-12-25 MED ORDER — PREDNISONE 10 MG PO TABS: 50.0000 mg | ORAL_TABLET | Freq: Every day | ORAL | Status: DC

## 2012-12-25 MED ORDER — AMPHETAMINE-DEXTROAMPHET ER 25 MG PO CP24: 25.0000 mg | ORAL_CAPSULE | ORAL | Status: DC

## 2013-01-02 ENCOUNTER — Other Ambulatory Visit: Payer: Self-pay | Admitting: Family Medicine

## 2013-01-02 MED ORDER — ALPRAZOLAM 1 MG PO TABS: 1.0000 mg | ORAL_TABLET | Freq: Every evening | ORAL | Status: DC | PRN

## 2013-01-07 ENCOUNTER — Other Ambulatory Visit: Payer: Self-pay | Admitting: Physician Assistant

## 2013-01-11 ENCOUNTER — Ambulatory Visit (INDEPENDENT_AMBULATORY_CARE_PROVIDER_SITE_OTHER): Payer: Federal, State, Local not specified - PPO | Admitting: Sports Medicine

## 2013-01-11 ENCOUNTER — Encounter: Payer: Self-pay | Admitting: Sports Medicine

## 2013-01-11 ENCOUNTER — Telehealth: Payer: Self-pay | Admitting: *Deleted

## 2013-01-11 VITALS — BP 110/65 | HR 57 | Wt 137.0 lb

## 2013-01-11 DIAGNOSIS — G8929 Other chronic pain: Secondary | ICD-10-CM

## 2013-01-11 DIAGNOSIS — M542 Cervicalgia: Secondary | ICD-10-CM

## 2013-01-11 NOTE — Progress Notes (Signed)
  Subjective:    CC: Followup  HPI: Chronic neck pain: Selena Batten comes back, she is a Engineer, civil (consulting) here in our office, she has a history of cervical degenerative disc disease confirmed on MRI years ago, we tried multiple medications to help her pain including steroids, muscle relaxers at every type, formal physical therapy, and recent x-rays, unfortunately none of these has helped and she continues with a bilateral C8 radiculitis. Pain is moderate, persistent, radiates as above. Has been present for years. Occasionally when she takes Relpax, her headache and part of her neck pain improves, but some still persists.  Past medical history, Surgical history, Family history not pertinant except as noted below, Social history, Allergies, and medications have been entered into the medical record, reviewed, and no changes needed.   Review of Systems: No fevers, chills, night sweats, weight loss, chest pain, or shortness of breath.   Objective:    General: Well Developed, well nourished, and in no acute distress.  Neuro: Alert and oriented x3, extra-ocular muscles intact, sensation grossly intact.  HEENT: Normocephalic, atraumatic, pupils equal round reactive to light, neck supple, no masses, no lymphadenopathy, thyroid nonpalpable.  Skin: Warm and dry, no rashes. Cardiac: Regular rate and rhythm, no murmurs rubs or gallops, no lower extremity edema.  Respiratory: Clear to auscultation bilaterally. Not using accessory muscles, speaking in full sentences. Neck: Inspection unremarkable. No palpable stepoffs. Negative Spurling's maneuver. Full neck range of motion Grip strength and sensation normal in bilateral hands Strength good C4 to T1 distribution No sensory change to C4 to T1 Negative Hoffman sign bilaterally Reflexes normal Impression and Recommendations:

## 2013-01-11 NOTE — Assessment & Plan Note (Signed)
Persistent pain despite multiple oral medications, muscle relaxers, formal physical therapy. Relpax tends to resolve her headaches but not her neck pain. Symptoms are now radicular as well the bilateral C8 radiculitis. Last MRI was 6 years ago and showed multilevel disc protrusions, we do need another one. I do suspect that we will proceed to interventional management.

## 2013-01-11 NOTE — Telephone Encounter (Signed)
PA not required for MRI Cervical spine w/o contrast. Eber Jones at [facility] informed.  Meyer Cory, LPN

## 2013-01-21 ENCOUNTER — Telehealth: Payer: Self-pay | Admitting: *Deleted

## 2013-01-21 ENCOUNTER — Ambulatory Visit (INDEPENDENT_AMBULATORY_CARE_PROVIDER_SITE_OTHER): Payer: Federal, State, Local not specified - PPO

## 2013-01-21 ENCOUNTER — Other Ambulatory Visit: Payer: Self-pay | Admitting: Family Medicine

## 2013-01-21 ENCOUNTER — Ambulatory Visit (INDEPENDENT_AMBULATORY_CARE_PROVIDER_SITE_OTHER): Payer: Federal, State, Local not specified - PPO | Admitting: Sports Medicine

## 2013-01-21 ENCOUNTER — Telehealth: Payer: Self-pay | Admitting: Family Medicine

## 2013-01-21 ENCOUNTER — Encounter: Payer: Self-pay | Admitting: Sports Medicine

## 2013-01-21 VITALS — BP 111/70 | HR 60 | Wt 137.0 lb

## 2013-01-21 DIAGNOSIS — S0993XA Unspecified injury of face, initial encounter: Secondary | ICD-10-CM

## 2013-01-21 DIAGNOSIS — H5712 Ocular pain, left eye: Secondary | ICD-10-CM

## 2013-01-21 DIAGNOSIS — J3489 Other specified disorders of nose and nasal sinuses: Secondary | ICD-10-CM

## 2013-01-21 DIAGNOSIS — S022XXA Fracture of nasal bones, initial encounter for closed fracture: Secondary | ICD-10-CM | POA: Insufficient documentation

## 2013-01-21 DIAGNOSIS — W219XXA Striking against or struck by unspecified sports equipment, initial encounter: Secondary | ICD-10-CM

## 2013-01-21 MED ORDER — OXYCODONE-ACETAMINOPHEN 5-325 MG PO TABS: 1.0000 | ORAL_TABLET | ORAL | Status: DC | PRN

## 2013-01-21 NOTE — Progress Notes (Addendum)
  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 She recalls seeing stars, but not losing consciousness. She had immediate pain, bruising, swelling over the bridge of her nose, extending across the inferior aspect of her left orbit. She does have a small amount of numbness over the left anterior face and cheek. She denies any symptoms of malocclusion, denies any visual changes, diplopia, but does note pain with attempted upgaze. Symptoms are moderate, persistent. She has a little off balance, and slightly nauseated, which is suggestive of a concussion.  Past medical history, Surgical history, Family history not pertinant except as noted below, Social history, Allergies, and medications have been entered into the medical record, reviewed, and no changes needed.   Review of Systems: No fevers, chills, night sweats, weight loss, chest pain, or shortness of breath.   Objective:    General: Well Developed, well nourished, and in no acute distress.  Neuro: Alert and oriented x3, extra-ocular muscles intact, sensation grossly intact. Cranial nerves 2 through 12 are intact, motor, sensory, coordination functions are unremarkable. HEENT: Normocephalic, atraumatic, pupils equal round reactive to light, neck supple, no masses, no lymphadenopathy, thyroid nonpalpable. Oropharynx, extremity or canals are unremarkable, there is no tenderness to palpation in the oral cavity, there is tenderness over the bridge of the nose, left nasal bone, left orbit. There is no visible deformity. Skin: Warm and dry, no rashes. Cardiac: Regular rate and rhythm, no murmurs rubs or gallops, no lower extremity edema.  Respiratory: Clear to auscultation bilaterally. Not using accessory muscles, speaking in full sentences.  CT scan was reviewed, there is a an essentially nondisplaced crack through the left nasal  bone.  Impression and Recommendations:

## 2013-01-21 NOTE — Telephone Encounter (Signed)
Pt informed.  Rockie Schnoor, LPN  

## 2013-01-21 NOTE — Assessment & Plan Note (Addendum)
Pain over the dorsum of the nose, as well as pain with elevation of the left eye is suggestive of a nasal bone fracture, and possible orbital floor fracture. CT facial bones today, oxycodone. Followup will be dependent on the results of the CT scan. Continue icing 20 minutes 3-4 times a day, avoid icing over the globe of the eye.  CT shows a closed fracture of the nasal bones, essentially nondisplaced. Cosmetic appearance is acceptable, at this point I would like to see her back in approximately 5-7 days to allow swelling to subside, at that point we can make a decision regarding her cosmetic result.

## 2013-01-21 NOTE — Telephone Encounter (Signed)
Message copied by Renea Ee on Mon Jan 21, 2013  3:13 PM ------      Message from: Monica Becton      Created: Mon Jan 21, 2013 12:09 PM       Please let him know that I would like to see her back in approximately 5-7 days, continue the icing, and pain medication.            Surgical intervention is unnecessary for this type of fracture, in 5-7 days need to pass after the fracture to allow swelling to subside, before this decision is even made.Marland Kitchen ------

## 2013-01-21 NOTE — Telephone Encounter (Signed)
Patient was hit in the left I with a softball on Saturday. Because of the pain swelling and bruising she went to urgent care. They recommended a CT of the face. She had significant nosebleed that took quite a while to stop on Saturday. No drainage from her ear. She hasn't nauseated and had a headache since then. Says its painful to even smile. She has a large amount of bruising underneath the left side across the nasal bridge as well as a significant swelling of the left facial cheek. Will schedule for CT.

## 2013-01-22 ENCOUNTER — Other Ambulatory Visit: Payer: Federal, State, Local not specified - PPO

## 2013-01-28 ENCOUNTER — Other Ambulatory Visit: Payer: Self-pay | Admitting: Sports Medicine

## 2013-01-28 DIAGNOSIS — S022XXD Fracture of nasal bones, subsequent encounter for fracture with routine healing: Secondary | ICD-10-CM

## 2013-01-28 NOTE — Progress Notes (Signed)
[name] still has nasal obstruction, she also has a mild cosmetic deformity at the site of the nasal bone fracture. At this point I am going to refer her to [facility] for consideration of operative intervention.

## 2013-01-30 ENCOUNTER — Other Ambulatory Visit: Payer: Self-pay | Admitting: Sports Medicine

## 2013-01-30 MED ORDER — OXYCODONE-ACETAMINOPHEN 10-325 MG PO TABS: 1.0000 | ORAL_TABLET | Freq: Three times a day (TID) | ORAL | Status: DC | PRN

## 2013-01-30 NOTE — Progress Notes (Signed)
Otolaryngology visit tomorrow. Refilling Percocet 10/325.

## 2013-02-14 ENCOUNTER — Other Ambulatory Visit: Payer: Self-pay | Admitting: *Deleted

## 2013-02-14 MED ORDER — ELETRIPTAN HYDROBROMIDE 40 MG PO TABS: 40.0000 mg | ORAL_TABLET | ORAL | Status: DC | PRN

## 2013-02-18 ENCOUNTER — Other Ambulatory Visit: Payer: Self-pay | Admitting: *Deleted

## 2013-02-18 MED ORDER — ACYCLOVIR 400 MG PO TABS: 400.0000 mg | ORAL_TABLET | Freq: Three times a day (TID) | ORAL | Status: DC

## 2013-02-18 MED ORDER — VENLAFAXINE HCL ER 75 MG PO CP24: 75.0000 mg | ORAL_CAPSULE | Freq: Every day | ORAL | Status: DC

## 2013-02-18 MED ORDER — PROPRANOLOL HCL 40 MG PO TABS: 40.0000 mg | ORAL_TABLET | Freq: Two times a day (BID) | ORAL | Status: DC

## 2013-02-26 ENCOUNTER — Other Ambulatory Visit: Payer: Self-pay | Admitting: *Deleted

## 2013-02-26 MED ORDER — AMPHETAMINE-DEXTROAMPHET ER 25 MG PO CP24: 25.0000 mg | ORAL_CAPSULE | ORAL | Status: DC

## 2013-03-05 ENCOUNTER — Ambulatory Visit (INDEPENDENT_AMBULATORY_CARE_PROVIDER_SITE_OTHER): Payer: Federal, State, Local not specified - PPO

## 2013-03-05 ENCOUNTER — Other Ambulatory Visit: Payer: Self-pay | Admitting: Family Medicine

## 2013-03-05 DIAGNOSIS — M47812 Spondylosis without myelopathy or radiculopathy, cervical region: Secondary | ICD-10-CM

## 2013-03-05 DIAGNOSIS — M5030 Other cervical disc degeneration, unspecified cervical region: Secondary | ICD-10-CM

## 2013-03-05 DIAGNOSIS — G8929 Other chronic pain: Secondary | ICD-10-CM

## 2013-03-05 DIAGNOSIS — M542 Cervicalgia: Secondary | ICD-10-CM

## 2013-03-05 MED ORDER — ALPRAZOLAM 1 MG PO TABS: 1.0000 mg | ORAL_TABLET | Freq: Every evening | ORAL | Status: DC | PRN

## 2013-03-22 ENCOUNTER — Ambulatory Visit (INDEPENDENT_AMBULATORY_CARE_PROVIDER_SITE_OTHER): Payer: Federal, State, Local not specified - PPO | Admitting: Family Medicine

## 2013-03-22 ENCOUNTER — Encounter: Payer: Self-pay | Admitting: Family Medicine

## 2013-03-22 VITALS — BP 106/68 | HR 58 | Wt 129.0 lb

## 2013-03-22 DIAGNOSIS — G250 Essential tremor: Secondary | ICD-10-CM

## 2013-03-22 DIAGNOSIS — G4700 Insomnia, unspecified: Secondary | ICD-10-CM

## 2013-03-22 DIAGNOSIS — R413 Other amnesia: Secondary | ICD-10-CM

## 2013-03-22 DIAGNOSIS — F39 Unspecified mood [affective] disorder: Secondary | ICD-10-CM

## 2013-03-22 DIAGNOSIS — F988 Other specified behavioral and emotional disorders with onset usually occurring in childhood and adolescence: Secondary | ICD-10-CM

## 2013-03-22 MED ORDER — AMPHETAMINE-DEXTROAMPHETAMINE 20 MG PO TABS: 20.0000 mg | ORAL_TABLET | Freq: Two times a day (BID) | ORAL | Status: DC

## 2013-03-22 NOTE — Progress Notes (Signed)
Subjective:    Patient ID: [name], female    DOB: 21-Aug-1970, 41 y.o.   MRN: [id number]  HPI ADD- She is feeling really nauseated and not wanting to eat. She says she will eat a small amount and then for the rest of the plate away because she's not hungry Has lost 8 lbs.  she is currently taking Adderall XR 25 mg. She does feel it helps but she can feel it wearing off around 3 PM at that time she gets home and starts to do some projects she just feels exhausted and doesn't want to do anything. She is actually interested in trying the immediate release twice a day, so she has a little more flexibility and timing of her dose.  Mood disorder- Increased her Effexor to 2 tabs (150) total  Says has been less irritable on inc dose.   Memory loss-Still not remembering things well.  She says she continues to struggle. She says she will just try to laugh it off because it limits embarrassing. His been going on for at least a year if not a little bit longer. We had done a lot of blood work back in the fall and the winter to rule out things such as thyroid and B12 deficiency in August was normal. She says it hasn't gotten worse per se but is about the same. Her current medication regimen really has not changed except for the increase on her Effexor. She says that has not worsened with the increase in the medication.  Insomnia-she has been using her Xanax more frequently to help her sleep. Before she was using it as needed and now she is taking it on a nightly.   Review of Systems     Objective:   Physical Exam  Constitutional: She is oriented to person, place, and time. She appears well-developed.  HENT:  Head: Normocephalic and atraumatic.  Neurological: She is alert and oriented to person, place, and time.  Skin: Skin is warm and dry.  Psychiatric: She has a normal mood and affect. Her behavior is normal. Judgment and thought content normal.          Assessment & Plan:  ADD- clearly  the Adderall XR is too strong a dose if it's suppressing her appetite that much. We can certainly try a short release twice a day. Wanted her to not take it too late in the day as it can affect her sleep quality. I recommend not taking it later than 1:00 in the afternoon. We'll also decrease the dose to 20 mg twice a day which should be little less than what she was getting to the extended release product. If she still getting significant appetite suppression and she needs to let me know so that we can decrease the dose even more either try a different stimulant medication.  Mood disorder-improving on 150 mg of Effexor. We can certainly change the dose when she is due for refill. She's currently taking 2 tabs of the 75 mg dose.  Memory loss-we have done a fair amount of blood work to rule out common causes for memory issues. She's even had MRI of the brain was normal. At this point I recommend referral to neuropsychiatry for further evaluation. I do agree that memory loss at this age is unusual. Interestingly she does have a strong family history of Parkinson's. She herself has a mild essential tremor.  Insomnia-she has been more dependent on her Xanax to sleep. Continue to try to use  sparingly if at all possible. She has noticed her sleep has improved with increase in exercise but this will help as well.

## 2013-03-28 ENCOUNTER — Other Ambulatory Visit: Payer: Self-pay | Admitting: *Deleted

## 2013-03-28 MED ORDER — CARISOPRODOL 350 MG PO TABS: ORAL_TABLET | ORAL | Status: DC

## 2013-04-05 ENCOUNTER — Other Ambulatory Visit: Payer: Self-pay | Admitting: *Deleted

## 2013-04-05 MED ORDER — ALPRAZOLAM 1 MG PO TABS: 1.0000 mg | ORAL_TABLET | Freq: Every evening | ORAL | Status: DC | PRN

## 2013-04-10 ENCOUNTER — Other Ambulatory Visit: Payer: Self-pay | Admitting: *Deleted

## 2013-04-10 MED ORDER — VENLAFAXINE HCL ER 75 MG PO CP24: 75.0000 mg | ORAL_CAPSULE | Freq: Every day | ORAL | Status: DC

## 2013-04-11 ENCOUNTER — Other Ambulatory Visit: Payer: Self-pay | Admitting: Family Medicine

## 2013-04-11 MED ORDER — VENLAFAXINE HCL ER 150 MG PO CP24: 150.0000 mg | ORAL_CAPSULE | Freq: Every day | ORAL | Status: DC

## 2013-04-13 DIAGNOSIS — M21629 Bunionette of unspecified foot: Secondary | ICD-10-CM | POA: Insufficient documentation

## 2013-04-16 ENCOUNTER — Ambulatory Visit (INDEPENDENT_AMBULATORY_CARE_PROVIDER_SITE_OTHER): Payer: Federal, State, Local not specified - PPO | Admitting: Sports Medicine

## 2013-04-16 ENCOUNTER — Encounter: Payer: Self-pay | Admitting: Sports Medicine

## 2013-04-16 VITALS — BP 107/65 | HR 61 | Wt 131.0 lb

## 2013-04-16 DIAGNOSIS — M5030 Other cervical disc degeneration, unspecified cervical region: Secondary | ICD-10-CM

## 2013-04-16 DIAGNOSIS — G8929 Other chronic pain: Secondary | ICD-10-CM

## 2013-04-16 MED ORDER — HYDROMORPHONE HCL 8 MG PO TABS: ORAL_TABLET | ORAL | Status: DC

## 2013-04-16 NOTE — Assessment & Plan Note (Signed)
[name] unfortunately has multilevel cervical degenerative disease, worse at the C6-C7 level with a large left-sided foraminal disc protrusion. She has failed physical therapy, oral steroids, muscle relaxers, narcotics, NSAIDs. At this point we are going to pursue a right-sided (her symptoms are predominantly on the right) C6-C7 interlaminar epidural steroid injection. I am going to add some Dilaudid for use in the meantime. She does have a foot surgery coming up, we will probably have to schedule the epidural for well afterwards.

## 2013-04-16 NOTE — Progress Notes (Signed)
  Subjective:    CC: MRI results  HPI: [name] returns, she has cervical degenerative disease, radiates down into the right trapezius most of the time, and this is where she has the majority of her pain. Occasionally it does radiate down into her left posterior shoulder in a dorsal scapular nerve distribution. Symptoms are worsened, she has failed physical therapy, steroids, muscle relaxers, NSAIDs, narcotics. Symptoms are persistent.  Past medical history, Surgical history, Family history not pertinant except as noted below, Social history, Allergies, and medications have been entered into the medical record, reviewed, and no changes needed.   Review of Systems: No fevers, chills, night sweats, weight loss, chest pain, or shortness of breath.   Objective:    General: Well Developed, well nourished, and in no acute distress.  Neuro: Alert and oriented x3, extra-ocular muscles intact, sensation grossly intact.  HEENT: Normocephalic, atraumatic, pupils equal round reactive to light, neck supple, no masses, no lymphadenopathy, thyroid nonpalpable.  Skin: Warm and dry, no rashes. Cardiac: Regular rate and rhythm, no murmurs rubs or gallops, no lower extremity edema.  Respiratory: Clear to auscultation bilaterally. Not using accessory muscles, speaking in full sentences.  Cervical spine MRI was reviewed, there is multilevel degenerative disc disease worse at the C6-C7 level with a moderate left sided disc protrusion. Again, symptoms continue to be worse on the right trapezius.  Impression and Recommendations:

## 2013-04-23 ENCOUNTER — Other Ambulatory Visit: Payer: Self-pay | Admitting: Family Medicine

## 2013-04-23 MED ORDER — FLUCONAZOLE 150 MG PO TABS: 150.0000 mg | ORAL_TABLET | Freq: Once | ORAL | Status: DC

## 2013-04-23 MED ORDER — METRONIDAZOLE 500 MG PO TABS: 500.0000 mg | ORAL_TABLET | Freq: Two times a day (BID) | ORAL | Status: DC

## 2013-04-23 NOTE — Progress Notes (Signed)
Pt called. 4-5 days of vaginal d/c itching and irritation. Hurts externally with urination. No fever.

## 2013-05-02 ENCOUNTER — Telehealth: Payer: Self-pay | Admitting: *Deleted

## 2013-05-02 MED ORDER — FLUCONAZOLE 150 MG PO TABS: 150.0000 mg | ORAL_TABLET | Freq: Once | ORAL | Status: DC

## 2013-05-02 MED ORDER — TRAMADOL HCL 50 MG PO TABS: 50.0000 mg | ORAL_TABLET | Freq: Two times a day (BID) | ORAL | Status: DC

## 2013-05-02 NOTE — Telephone Encounter (Signed)
Pt informed to have urine checked at office and Diflucan sent to pt's pharmacy per Dr. Linford Arnold.  Meyer Cory, LPN

## 2013-05-02 NOTE — Telephone Encounter (Signed)
Pt states she goes to bathroom and sits there for about 10 minutes before she can pee. States she gets the urge to pee and sometimes she can't once she sits there. She also states that her yeast infection back. States she can come in tomorrow in am after her surgeon appt. States it has been going on for about 2 weeks. Please advise.  Meyer Cory, LPN

## 2013-05-08 ENCOUNTER — Other Ambulatory Visit: Payer: Self-pay | Admitting: *Deleted

## 2013-05-08 MED ORDER — ALPRAZOLAM 1 MG PO TABS: 1.0000 mg | ORAL_TABLET | Freq: Every evening | ORAL | Status: DC | PRN

## 2013-05-27 ENCOUNTER — Other Ambulatory Visit: Payer: Self-pay

## 2013-05-27 ENCOUNTER — Other Ambulatory Visit: Payer: Self-pay | Admitting: *Deleted

## 2013-05-27 MED ORDER — CARISOPRODOL 350 MG PO TABS: ORAL_TABLET | ORAL | Status: DC

## 2013-05-30 ENCOUNTER — Other Ambulatory Visit: Payer: Self-pay

## 2013-06-06 ENCOUNTER — Other Ambulatory Visit: Payer: Self-pay | Admitting: *Deleted

## 2013-06-06 MED ORDER — ALPRAZOLAM 1 MG PO TABS: 1.0000 mg | ORAL_TABLET | Freq: Every evening | ORAL | Status: DC | PRN

## 2013-06-10 DIAGNOSIS — R413 Other amnesia: Secondary | ICD-10-CM

## 2013-06-10 DIAGNOSIS — F329 Major depressive disorder, single episode, unspecified: Secondary | ICD-10-CM

## 2013-06-11 ENCOUNTER — Other Ambulatory Visit: Payer: Self-pay | Admitting: *Deleted

## 2013-06-11 MED ORDER — ACYCLOVIR 400 MG PO TABS: 400.0000 mg | ORAL_TABLET | Freq: Three times a day (TID) | ORAL | Status: DC

## 2013-06-14 ENCOUNTER — Other Ambulatory Visit: Payer: Self-pay | Admitting: *Deleted

## 2013-06-14 MED ORDER — ELETRIPTAN HYDROBROMIDE 40 MG PO TABS: 40.0000 mg | ORAL_TABLET | ORAL | Status: DC | PRN

## 2013-06-18 DIAGNOSIS — F329 Major depressive disorder, single episode, unspecified: Secondary | ICD-10-CM

## 2013-06-18 DIAGNOSIS — R413 Other amnesia: Secondary | ICD-10-CM

## 2013-07-01 ENCOUNTER — Other Ambulatory Visit: Payer: Self-pay | Admitting: *Deleted

## 2013-07-01 DIAGNOSIS — G8929 Other chronic pain: Secondary | ICD-10-CM

## 2013-07-01 MED ORDER — VENLAFAXINE HCL ER 150 MG PO CP24: 150.0000 mg | ORAL_CAPSULE | Freq: Every day | ORAL | Status: DC

## 2013-07-01 MED ORDER — PREDNISONE 10 MG PO TABS: 50.0000 mg | ORAL_TABLET | Freq: Every day | ORAL | Status: DC

## 2013-07-04 ENCOUNTER — Other Ambulatory Visit: Payer: Self-pay | Admitting: *Deleted

## 2013-07-04 MED ORDER — ALPRAZOLAM 1 MG PO TABS: 1.0000 mg | ORAL_TABLET | Freq: Every evening | ORAL | Status: DC | PRN

## 2013-07-16 ENCOUNTER — Other Ambulatory Visit: Payer: Self-pay | Admitting: Family Medicine

## 2013-07-16 MED ORDER — VENLAFAXINE HCL ER 37.5 MG PO CP24: ORAL_CAPSULE | ORAL | Status: DC

## 2013-07-16 NOTE — Progress Notes (Signed)
Neurolgay wanting to change her to cymbalta. Had taken before but switched bc wasn't generic at that time.  Will wean the effexor. Taper rx sent to [facility]

## 2013-07-22 ENCOUNTER — Ambulatory Visit: Payer: Federal, State, Local not specified - PPO

## 2013-07-22 ENCOUNTER — Encounter: Payer: Self-pay | Admitting: Family Medicine

## 2013-07-22 ENCOUNTER — Ambulatory Visit (INDEPENDENT_AMBULATORY_CARE_PROVIDER_SITE_OTHER): Payer: Federal, State, Local not specified - PPO | Admitting: Family Medicine

## 2013-07-22 VITALS — BP 103/66 | HR 81 | Temp 98.2°F | Wt 133.0 lb

## 2013-07-22 DIAGNOSIS — J111 Influenza due to unidentified influenza virus with other respiratory manifestations: Secondary | ICD-10-CM

## 2013-07-22 MED ORDER — PROMETHAZINE HCL 25 MG PO TABS: ORAL_TABLET | ORAL | Status: DC

## 2013-07-22 MED ORDER — OSELTAMIVIR PHOSPHATE 75 MG PO CAPS: 75.0000 mg | ORAL_CAPSULE | Freq: Two times a day (BID) | ORAL | Status: DC

## 2013-07-22 MED ORDER — HYDROCOD POLST-CHLORPHEN POLST 10-8 MG/5ML PO LQCR: 5.0000 mL | Freq: Two times a day (BID) | ORAL | Status: DC | PRN

## 2013-07-22 NOTE — Progress Notes (Signed)
   Subjective:    Patient ID: Normand Sloop, female    DOB: 02-15-1971, 42 y.o.   MRN: [id number]  Cough Associated symptoms include headaches.  Headache  Associated symptoms include coughing.    Very ST, cough, chills and sweats.  Some SOB.  + HA. STarted lastnight. + sick contacts.  Took mucinex - D this AM and IBUprofen and halls cough drops. Nausea + .  No vomiting.  No diarrhea.    + runny nose.   Review of Systems  Respiratory: Positive for cough.   Neurological: Positive for headaches.       Objective:   Physical Exam  Constitutional: She is oriented to person, place, and time. She appears well-developed and well-nourished.  HENT:  Head: Normocephalic and atraumatic.  Right Ear: External ear normal.  Left Ear: External ear normal.  Nose: Nose normal.  Mouth/Throat: Oropharynx is clear and moist.  TMs and canals are clear.   Eyes: Conjunctivae and EOM are normal. Pupils are equal, round, and reactive to light.  Neck: Neck supple. No thyromegaly present.  Cardiovascular: Normal rate, regular rhythm and normal heart sounds.   Pulmonary/Chest: Effort normal and breath sounds normal. She has no wheezes.  Lymphadenopathy:    She has no cervical adenopathy.  Neurological: She is alert and oriented to person, place, and time.  Skin: Skin is warm and dry.  Psychiatric: She has a normal mood and affect. Her behavior is normal.          Assessment & Plan:  ILI - likely viral. Neg for flu.  Consider tamiflu but not studies to show helps with ILI vs influenza. Symptomatic care. Call if getting worse.

## 2013-07-25 HISTORY — PX: ESOPHAGOGASTRODUODENOSCOPY: SHX1529

## 2013-07-30 ENCOUNTER — Telehealth: Payer: Self-pay | Admitting: *Deleted

## 2013-08-02 ENCOUNTER — Other Ambulatory Visit: Payer: Self-pay | Admitting: *Deleted

## 2013-08-02 MED ORDER — ALPRAZOLAM 1 MG PO TABS: 1.0000 mg | ORAL_TABLET | Freq: Every evening | ORAL | Status: DC | PRN

## 2013-08-08 ENCOUNTER — Encounter: Payer: Self-pay | Admitting: Family Medicine

## 2013-08-08 ENCOUNTER — Ambulatory Visit (INDEPENDENT_AMBULATORY_CARE_PROVIDER_SITE_OTHER): Payer: Federal, State, Local not specified - PPO | Admitting: Family Medicine

## 2013-08-08 VITALS — BP 122/66 | HR 93 | Temp 97.4°F | Ht 65.6 in | Wt 132.0 lb

## 2013-08-08 DIAGNOSIS — G4700 Insomnia, unspecified: Secondary | ICD-10-CM

## 2013-08-08 DIAGNOSIS — N959 Unspecified menopausal and perimenopausal disorder: Secondary | ICD-10-CM

## 2013-08-08 DIAGNOSIS — F32A Depression, unspecified: Secondary | ICD-10-CM

## 2013-08-08 DIAGNOSIS — F329 Major depressive disorder, single episode, unspecified: Secondary | ICD-10-CM

## 2013-08-08 DIAGNOSIS — F3289 Other specified depressive episodes: Secondary | ICD-10-CM

## 2013-08-08 MED ORDER — DULOXETINE HCL 60 MG PO CPEP: 60.0000 mg | ORAL_CAPSULE | Freq: Every day | ORAL | Status: DC

## 2013-08-08 MED ORDER — ESTRADIOL 0.05 MG/24HR TD PTTW: 1.0000 | MEDICATED_PATCH | TRANSDERMAL | Status: DC

## 2013-08-08 NOTE — Progress Notes (Signed)
Subjective:    Patient ID: [name], female    DOB: 01-31-1971, 43 y.o.   MRN: [id number]  HPI Here to review her neuropsych evaluation.    She is weaning off her effexor and started on cymbalta 60mg . She is drinking more water and has really cut our her soda ( artificial sweetners).  She is hard time focusing.  She feels like she is going crazy at times.  She still smokes. Feel like in a haze all the time.  Mom had similar sxs when she went through menopause.  Affecting her memory and processing at work.  She usually feels her best when she wakes up in the morning and then she says by about 9:00 in the morning she just feels like everything is going crazy. She feels that she's having a hard time staying organized and remembering things. She notices that at social events she is more withdrawn and quiet and doesn't typically interact. Back she typically stones out into her own mind. Several years ago she was up and active and when up and walking around talking and laughing with everyone. She says it's definitely not like herself. She says she's ready to make changes if needed. She doesn't want to be alone the rest of her life. She doesn't want it to affect her family and her friends and to push them away to the point that she is by herself.  Insomnia-uses Xanax to fall asleep nightly. She did come off of it for a period for him as to year and slept somewhat better on her own but then started to get worse again and restarted the medication. She does wake up in the morning feeling good.  Review of Systems     Objective:   Physical Exam  Constitutional: She is oriented to person, place, and time. She appears well-developed and well-nourished.  HENT:  Head: Normocephalic and atraumatic.  Eyes: Conjunctivae and EOM are normal.  Cardiovascular: Normal rate.   Pulmonary/Chest: Effort normal.  Neurological: She is alert and oriented to person, place, and time.  Skin: Skin is dry. No pallor.   Psychiatric: She has a normal mood and affect. Her behavior is normal.          Assessment & Plan:  Depression - We discussed options.  The effexor was working well but needed to switch to cymbalta to help with her neuropathy and it has helped with the tingling in her feet.  She completed the Effexor wean yesterday.  She is now on Cymbalta 60mg  daily.  We discussed can work on sleep quality as well and her menopause. Can also consider increasing Cymbalta if needed.   Menopause - Discussed that with smoking she cannot be on hormone therapy. Says can't live like this anymore and if that means quitting smoking she is ready to do it.  She will switch to vapor e-cig.  Explained to her that she can't use the nicotine vapors as it is still nicotine can increase risk of blood clots.  She says she is committed to quitting. Will start vivelle dot patch. Change weekly.  She has had a hysterectomy.  F/u in 3 weeks to make sure doing well and adjust dose as needed.    Insomnia - Still using xanax to ehlp her sleep. She does wake up feeling refreshed. In fact that is the best she feels all day.  May need to look at therapy and possible med changes to look at helping with sleep architecture.

## 2013-08-12 ENCOUNTER — Ambulatory Visit
Admission: RE | Admit: 2013-08-12 | Discharge: 2013-08-12 | Disposition: A | Discharge status: Home / Self Care | Payer: Federal, State, Local not specified - PPO | Source: Ambulatory Visit | Attending: Sports Medicine | Admitting: Sports Medicine

## 2013-08-12 MED ORDER — TRIAMCINOLONE ACETONIDE 40 MG/ML IJ SUSP (RADIOLOGY)
40.0000 mg | Freq: Once | INTRAMUSCULAR | Status: AC
  Administered 2013-08-12: 40 mg via EPIDURAL

## 2013-08-12 MED ORDER — IOHEXOL 300 MG/ML  SOLN
1.0000 mL | Freq: Once | INTRAMUSCULAR | Status: AC | PRN
  Administered 2013-08-12: 1 mL via EPIDURAL

## 2013-08-12 NOTE — Discharge Instructions (Signed)

## 2013-08-26 ENCOUNTER — Telehealth: Payer: Self-pay | Admitting: *Deleted

## 2013-08-26 MED ORDER — NORTRIPTYLINE HCL 50 MG PO CAPS: ORAL_CAPSULE | ORAL | Status: DC

## 2013-08-26 NOTE — Telephone Encounter (Signed)
Pt states she would like to try the Nortriptyline you had discussed. She also wants to know if it will help with the pain.  It can be sent to [facility].  Oscar La, LPN

## 2013-08-26 NOTE — Telephone Encounter (Signed)
Adding nortriptyline at a low dose with an up taper

## 2013-08-27 NOTE — Telephone Encounter (Signed)
Pt informed.  Misty Ahmad, LPN  

## 2013-08-30 ENCOUNTER — Other Ambulatory Visit: Payer: Self-pay | Admitting: *Deleted

## 2013-08-30 MED ORDER — ELETRIPTAN HYDROBROMIDE 40 MG PO TABS: 40.0000 mg | ORAL_TABLET | ORAL | Status: DC | PRN

## 2013-09-01 ENCOUNTER — Other Ambulatory Visit: Payer: Self-pay | Admitting: Sports Medicine

## 2013-09-01 MED ORDER — ELETRIPTAN HYDROBROMIDE 40 MG PO TABS: 40.0000 mg | ORAL_TABLET | ORAL | Status: DC | PRN

## 2013-09-01 NOTE — Telephone Encounter (Signed)
Patient with active migraine, needs refill per request on relpax.

## 2013-09-02 ENCOUNTER — Other Ambulatory Visit: Payer: Self-pay | Admitting: *Deleted

## 2013-09-02 MED ORDER — AMPHETAMINE-DEXTROAMPHETAMINE 20 MG PO TABS: 20.0000 mg | ORAL_TABLET | Freq: Two times a day (BID) | ORAL | Status: DC

## 2013-09-02 MED ORDER — ACYCLOVIR 400 MG PO TABS: 400.0000 mg | ORAL_TABLET | Freq: Three times a day (TID) | ORAL | Status: DC

## 2013-09-02 MED ORDER — ELETRIPTAN HYDROBROMIDE 40 MG PO TABS: 40.0000 mg | ORAL_TABLET | ORAL | Status: DC | PRN

## 2013-09-18 ENCOUNTER — Other Ambulatory Visit: Payer: Self-pay | Admitting: *Deleted

## 2013-09-18 MED ORDER — ESTRADIOL 0.05 MG/24HR TD PTTW: 1.0000 | MEDICATED_PATCH | TRANSDERMAL | Status: DC

## 2013-09-20 ENCOUNTER — Telehealth: Payer: Self-pay | Admitting: *Deleted

## 2013-09-20 ENCOUNTER — Ambulatory Visit (INDEPENDENT_AMBULATORY_CARE_PROVIDER_SITE_OTHER): Payer: Federal, State, Local not specified - PPO | Admitting: Sports Medicine

## 2013-09-20 DIAGNOSIS — M47812 Spondylosis without myelopathy or radiculopathy, cervical region: Secondary | ICD-10-CM

## 2013-09-20 MED ORDER — KETOROLAC TROMETHAMINE 30 MG/ML IJ SOLN
30.0000 mg | Freq: Once | INTRAMUSCULAR | Status: AC
  Administered 2013-09-20: 30 mg via INTRAMUSCULAR

## 2013-09-20 MED ORDER — NORTRIPTYLINE HCL 50 MG PO CAPS: 100.0000 mg | ORAL_CAPSULE | Freq: Every day | ORAL | Status: DC

## 2013-09-20 MED ORDER — METHYLPREDNISOLONE SODIUM SUCC 125 MG IJ SOLR
125.0000 mg | Freq: Once | INTRAMUSCULAR | Status: AC
  Administered 2013-09-20: 125 mg via INTRAMUSCULAR

## 2013-09-20 NOTE — Telephone Encounter (Signed)
Pt's information submitted and forms placed in folder.[name]

## 2013-09-20 NOTE — Progress Notes (Signed)
  Subjective:    CC: Followup  HPI: Cervical spondylosis: [name] has multilevel degenerative disc disease in her cervical spine, she had an initial right-sided C6-C7 interlaminar epidural, she did have only minimal relief initially but unfortunately her pain returned despite narcotics, NSAIDs, and soma. Her pain is now returned on the left side in the C7 distribution associated with weakness, making this more of a radiculopathy. She does have a trip coming up to [place] tomorrow, she does understand that she will need another epidural. Symptoms are moderate, persistent.   Complex regional pain syndrome: Localized on both feet after bunion surgery. Started nortriptyline at the last visit, she continues to have symptoms.  Past medical history, Surgical history, Family history not pertinant except as noted below, Social history, Allergies, and medications have been entered into the medical record, reviewed, and no changes needed.   Review of Systems: No fevers, chills, night sweats, weight loss, chest pain, or shortness of breath.   Objective:    General: Well Developed, well nourished, and in no acute distress.  Neuro: Alert and oriented x3, extra-ocular muscles intact, sensation grossly intact.  HEENT: Normocephalic, atraumatic, pupils equal round reactive to light, neck supple, no masses, no lymphadenopathy, thyroid nonpalpable.  Skin: Warm and dry, no rashes. Cardiac: Regular rate and rhythm, no murmurs rubs or gallops, no lower extremity edema.  Respiratory: Clear to auscultation bilaterally. Not using accessory muscles, speaking in full sentences. Neck: Inspection unremarkable. No palpable stepoffs. Positive Spurling's maneuver with reproduction of left-sided C7 radicular symptoms. Full neck range of motion Grip strength and sensation normal in bilateral hands Strength good C4 to T1 distribution No sensory change to C4 to T1 Negative Hoffman sign bilaterally Reflexes normal  Toradol  30 mg, Solu-Medrol 125 mg given intramuscular in the office.  Impression and Recommendations:

## 2013-09-20 NOTE — Assessment & Plan Note (Signed)
Persistent left-sided radicular symptoms referable to the C7 nerve root. MRI does confirm C6-C7 disc reason left-sided foraminal extension. Initial epidural did not provide much response beyond the initial lidocaine. Doub nortriptyline, Toradol and Solu-Medrol here in the office. When she comes back from [place] we are going to order a left-sided C6-C7 transforaminal if possible epidural.

## 2013-09-30 ENCOUNTER — Other Ambulatory Visit: Payer: Self-pay | Admitting: *Deleted

## 2013-09-30 MED ORDER — ALPRAZOLAM 1 MG PO TABS
1.0000 mg | ORAL_TABLET | Freq: Every evening | ORAL | Status: DC | PRN
Start: 2013-09-30 — End: 2013-11-27

## 2013-09-30 MED ORDER — ESTRADIOL 0.05 MG/24HR TD PTTW: 1.0000 | MEDICATED_PATCH | TRANSDERMAL | Status: DC

## 2013-10-01 ENCOUNTER — Other Ambulatory Visit: Payer: Self-pay | Admitting: *Deleted

## 2013-10-01 MED ORDER — ESTRADIOL 0.05 MG/24HR TD PTTW: 1.0000 | MEDICATED_PATCH | TRANSDERMAL | Status: DC

## 2013-10-07 ENCOUNTER — Encounter: Payer: Federal, State, Local not specified - PPO | Admitting: Physician Assistant

## 2013-10-07 ENCOUNTER — Encounter: Payer: Self-pay | Admitting: Sports Medicine

## 2013-10-07 ENCOUNTER — Ambulatory Visit (INDEPENDENT_AMBULATORY_CARE_PROVIDER_SITE_OTHER): Payer: Federal, State, Local not specified - PPO | Admitting: Sports Medicine

## 2013-10-07 ENCOUNTER — Ambulatory Visit (INDEPENDENT_AMBULATORY_CARE_PROVIDER_SITE_OTHER): Payer: Federal, State, Local not specified - PPO

## 2013-10-07 ENCOUNTER — Other Ambulatory Visit: Payer: Self-pay | Admitting: Physician Assistant

## 2013-10-07 VITALS — BP 110/71 | HR 77 | Wt 145.0 lb

## 2013-10-07 DIAGNOSIS — M25561 Pain in right knee: Secondary | ICD-10-CM

## 2013-10-07 DIAGNOSIS — S8991XA Unspecified injury of right lower leg, initial encounter: Secondary | ICD-10-CM

## 2013-10-07 DIAGNOSIS — S8000XA Contusion of unspecified knee, initial encounter: Secondary | ICD-10-CM

## 2013-10-07 DIAGNOSIS — M25569 Pain in unspecified knee: Secondary | ICD-10-CM

## 2013-10-07 DIAGNOSIS — M25562 Pain in left knee: Secondary | ICD-10-CM | POA: Insufficient documentation

## 2013-10-07 DIAGNOSIS — W19XXXA Unspecified fall, initial encounter: Secondary | ICD-10-CM

## 2013-10-07 MED ORDER — TRAMADOL-ACETAMINOPHEN 37.5-325 MG PO TABS: 1.0000 | ORAL_TABLET | Freq: Four times a day (QID) | ORAL | Status: DC | PRN

## 2013-10-07 NOTE — Progress Notes (Signed)
   Subjective:    I'm seeing this patient as a consultation for:   Iran Planas, PA-C, Dr. Beatrice Lecher.  CC: Left knee pain  HPI: Several days ago [name] unfortunately tripped over a cord, impacting both of her knees, she had immediate pain, swelling, which has improved only slightly. Pain is localized predominately on the left knee, at the medial tibial plateau. Moderate, persistent.  Past medical history, Surgical history, Family history not pertinant except as noted below, Social history, Allergies, and medications have been entered into the medical record, reviewed, and no changes needed.   Review of Systems: No headache, visual changes, nausea, vomiting, diarrhea, constipation, dizziness, abdominal pain, skin rash, fevers, chills, night sweats, weight loss, swollen lymph nodes, body aches, joint swelling, muscle aches, chest pain, shortness of breath, mood changes, visual or auditory hallucinations.   Objective:   General: Well Developed, well nourished, and in no acute distress.  Neuro/Psych: Alert and oriented x3, extra-ocular muscles intact, able to move all 4 extremities, sensation grossly intact. Skin: Warm and dry, no rashes noted.  Respiratory: Not using accessory muscles, speaking in full sentences, trachea midline.  Cardiovascular: Pulses palpable, no extremity edema. Abdomen: Does not appear distended. Left Knee: Tender to palpation with a bruise at the anteromedial joint line. ROM full in flexion and extension and lower leg rotation. Ligaments with solid consistent endpoints including ACL, PCL, LCL, MCL. Negative Mcmurray's, Apley's, and Thessalonian tests. Non painful patellar compression. Patellar glide without crepitus. Patellar and quadriceps tendons unremarkable. Hamstring and quadriceps strength is normal.   X-rays were reviewed and do show patellofemoral DJD, without any fracture.  Impression and Recommendations:   This case required medical decision  making of moderate complexity.

## 2013-10-07 NOTE — Assessment & Plan Note (Signed)
This occurred after a recent fall a few days ago where she tripped over a cord. X-rays are negative for fracture, she does have some patellofemoral degenerative changes. This likely represents a contusion, all ligaments are intact and stable. Strap compressive dressing, Ultracet as needed for pain, topical cryotherapy. Return to see me as needed.

## 2013-10-08 NOTE — Progress Notes (Signed)
This encounter was created in error - please disregard.

## 2013-11-05 ENCOUNTER — Telehealth: Payer: Self-pay | Admitting: *Deleted

## 2013-11-05 DIAGNOSIS — Z1231 Encounter for screening mammogram for malignant neoplasm of breast: Secondary | ICD-10-CM

## 2013-11-05 NOTE — Telephone Encounter (Signed)
mammo order placed

## 2013-11-05 NOTE — Telephone Encounter (Signed)
Pt is requesting order be placed for yearly mammogram.  Oscar La, LPN

## 2013-11-07 ENCOUNTER — Ambulatory Visit (INDEPENDENT_AMBULATORY_CARE_PROVIDER_SITE_OTHER): Payer: Federal, State, Local not specified - PPO

## 2013-11-07 DIAGNOSIS — Z1231 Encounter for screening mammogram for malignant neoplasm of breast: Secondary | ICD-10-CM

## 2013-11-27 ENCOUNTER — Encounter: Payer: Self-pay | Admitting: Family Medicine

## 2013-11-27 ENCOUNTER — Ambulatory Visit (INDEPENDENT_AMBULATORY_CARE_PROVIDER_SITE_OTHER): Payer: Federal, State, Local not specified - PPO | Admitting: Family Medicine

## 2013-11-27 VITALS — BP 117/67 | HR 79 | Wt 143.0 lb

## 2013-11-27 DIAGNOSIS — I493 Ventricular premature depolarization: Secondary | ICD-10-CM

## 2013-11-27 DIAGNOSIS — R41840 Attention and concentration deficit: Secondary | ICD-10-CM

## 2013-11-27 DIAGNOSIS — Z87891 Personal history of nicotine dependence: Secondary | ICD-10-CM

## 2013-11-27 DIAGNOSIS — Z79890 Hormone replacement therapy (postmenopausal): Secondary | ICD-10-CM

## 2013-11-27 DIAGNOSIS — K120 Recurrent oral aphthae: Secondary | ICD-10-CM

## 2013-11-27 DIAGNOSIS — R002 Palpitations: Secondary | ICD-10-CM

## 2013-11-27 DIAGNOSIS — I4949 Other premature depolarization: Secondary | ICD-10-CM

## 2013-11-27 DIAGNOSIS — G90529 Complex regional pain syndrome I of unspecified lower limb: Secondary | ICD-10-CM

## 2013-11-27 MED ORDER — ALPRAZOLAM 1 MG PO TABS: 1.5000 mg | ORAL_TABLET | Freq: Every evening | ORAL | Status: DC | PRN

## 2013-11-27 MED ORDER — AMPHETAMINE-DEXTROAMPHETAMINE 20 MG PO TABS: 20.0000 mg | ORAL_TABLET | Freq: Two times a day (BID) | ORAL | Status: DC

## 2013-11-27 NOTE — Progress Notes (Signed)
   Subjective:    Patient ID: [name], female    DOB: [birth year], 43 y.o.   MRN: [id number]  HPI Has felt better on the vivelle-dot. She feels mood is more stable. No big change in her memory or concentration. Says not worse but not really better.  Hot flashes seem to be better.    Tob abuse - She has quit smoking. Still using vaps.    Says the adderall helps her stay focused. No CP or SOB.  No palpitations.    Hs been having more PVCs lately.  They have been more frequent. She was diagnosed with that years ago.  She stopped the cymbalta bc felt like it wasn't really helping.  Say the pain doctor yesterday.  Dx with CRPS from the surgery.  Consider a spinal stimulator after seeing pain-psych    Insomnia - back up to 1.5 to 2 tabs of xanax to help her sleep. In part bc of her pain in her feet.  She has been using nucenta for pain relief. She is now on percocet.    Review of Systems     Objective:   Physical Exam  Constitutional: She is oriented to person, place, and time. She appears well-developed and well-nourished.  HENT:  Head: Normocephalic and atraumatic.  Cardiovascular: Normal rate, regular rhythm and normal heart sounds.   Pulmonary/Chest: Effort normal and breath sounds normal.  Neurological: She is alert and oriented to person, place, and time.  Skin: Skin is warm and dry.  Psychiatric: She has a normal mood and affect. Her behavior is normal.          Assessment & Plan:  Tob abuse  - congratulated her on her success in quitting. She is still using the papers from time to time.  HRT - doing well on current hormone replacement therapy. She is on a moderate dose hormone. We can certainly adjust as needed but right now I think her symptoms are manageable and she feels so as well.  Insomnia - has had increase her Xanax from 1-1-1/2 tabs up to 2 tabs at some nights. We'll need to adjust her dose as she gets 60 tabs per month. She says she feeling this is  temporary and is hoping that she will get a spinal stimulator for her foot pain and can go back to reducing her dependence on the medication for sleep.  Behcets - she still has a history of questionable behcets.  She was diagnosed at one point. She did see dermatology a couple years ago who recommended therapy as well. I think it would be worth considering getting evaluated by someone who specializes in this area especially since she's had more recent issues with visual changes, watery eyes, and difficulty concentrating and with her memory.  Lack of focus-does well on Adderall. Continue current regimen. She's not expensing symptoms on it and just uses it as needed. Refill given today.  Palpitations/PVCs-we did not do an EKG today. But we will check thyroid and for anemia. Consider adding a beta blocker for control of symptoms. We discussed triggers such as stress, lack of sleep, caffeine etc.

## 2013-12-17 LAB — COMPLETE METABOLIC PANEL WITH GFR
ALT: 14 U/L (ref 0–35)
AST: 17 U/L (ref 0–37)
Albumin: 4.1 g/dL (ref 3.5–5.2)
Alkaline Phosphatase: 50 U/L (ref 39–117)
BUN: 7 mg/dL (ref 6–23)
CALCIUM: 9.3 mg/dL (ref 8.4–10.5)
CHLORIDE: 103 meq/L (ref 96–112)
CO2: 28 mEq/L (ref 19–32)
CREATININE: 0.66 mg/dL (ref 0.50–1.10)
GLUCOSE: 82 mg/dL (ref 70–99)
Potassium: 4.6 mEq/L (ref 3.5–5.3)
Sodium: 138 mEq/L (ref 135–145)
Total Bilirubin: 0.6 mg/dL (ref 0.2–1.2)
Total Protein: 6.2 g/dL (ref 6.0–8.3)

## 2013-12-17 LAB — CBC
HCT: 38.8 % (ref 36.0–46.0)
Hemoglobin: 13.3 g/dL (ref 12.0–15.0)
MCH: 30 pg (ref 26.0–34.0)
MCHC: 34.3 g/dL (ref 30.0–36.0)
MCV: 87.6 fL (ref 78.0–100.0)
Platelets: 304 10*3/uL (ref 150–400)
RBC: 4.43 MIL/uL (ref 3.87–5.11)
RDW: 13.6 % (ref 11.5–15.5)
WBC: 6.7 10*3/uL (ref 4.0–10.5)

## 2013-12-17 LAB — TSH: TSH: 1.591 u[IU]/mL (ref 0.350–4.500)

## 2013-12-17 LAB — FERRITIN: Ferritin: 38 ng/mL (ref 10–291)

## 2014-01-07 ENCOUNTER — Telehealth: Payer: Self-pay | Admitting: *Deleted

## 2014-01-07 MED ORDER — SUMATRIPTAN SUCCINATE 50 MG PO TABS: 50.0000 mg | ORAL_TABLET | ORAL | Status: DC | PRN

## 2014-01-07 NOTE — Telephone Encounter (Signed)
Pt is asking if we can send rx for Imitrex to her pharmacy.  Oscar La, LPN

## 2014-01-07 NOTE — Telephone Encounter (Signed)
Ok to fill . Tell her I said Hi!

## 2014-02-26 ENCOUNTER — Other Ambulatory Visit: Payer: Self-pay | Admitting: *Deleted

## 2014-02-26 ENCOUNTER — Encounter: Payer: Self-pay | Admitting: Family Medicine

## 2014-02-26 DIAGNOSIS — M542 Cervicalgia: Principal | ICD-10-CM

## 2014-02-26 DIAGNOSIS — G8929 Other chronic pain: Secondary | ICD-10-CM

## 2014-02-26 MED ORDER — ALPRAZOLAM 1 MG PO TABS: 1.5000 mg | ORAL_TABLET | Freq: Every evening | ORAL | Status: DC | PRN

## 2014-02-26 MED ORDER — PREDNISONE 10 MG PO TABS: 50.0000 mg | ORAL_TABLET | Freq: Every day | ORAL | Status: DC

## 2014-04-07 DIAGNOSIS — M792 Neuralgia and neuritis, unspecified: Secondary | ICD-10-CM | POA: Insufficient documentation

## 2014-04-22 ENCOUNTER — Encounter: Payer: Self-pay | Admitting: Family Medicine

## 2014-04-22 ENCOUNTER — Other Ambulatory Visit: Payer: Self-pay | Admitting: Family Medicine

## 2014-04-22 MED ORDER — SUVOREXANT 10 MG PO TABS: 10.0000 mg | ORAL_TABLET | Freq: Every day | ORAL | Status: DC

## 2014-05-08 ENCOUNTER — Other Ambulatory Visit: Payer: Self-pay | Admitting: Family Medicine

## 2014-05-09 ENCOUNTER — Other Ambulatory Visit: Payer: Self-pay | Admitting: *Deleted

## 2014-05-09 MED ORDER — SUMATRIPTAN SUCCINATE 50 MG PO TABS: 50.0000 mg | ORAL_TABLET | ORAL | Status: DC | PRN

## 2014-05-09 NOTE — Telephone Encounter (Signed)
Refill given. Appointment required for future refills.Audelia Hives [place]

## 2014-05-15 ENCOUNTER — Other Ambulatory Visit: Payer: Self-pay

## 2014-05-15 MED ORDER — ACYCLOVIR 400 MG PO TABS: 400.0000 mg | ORAL_TABLET | Freq: Three times a day (TID) | ORAL | Status: DC

## 2014-05-19 ENCOUNTER — Telehealth: Payer: Self-pay

## 2014-05-19 DIAGNOSIS — G8929 Other chronic pain: Secondary | ICD-10-CM

## 2014-05-19 DIAGNOSIS — M542 Cervicalgia: Principal | ICD-10-CM

## 2014-05-19 MED ORDER — PREDNISONE 10 MG PO TABS: 50.0000 mg | ORAL_TABLET | Freq: Every day | ORAL | Status: DC

## 2014-05-19 NOTE — Telephone Encounter (Signed)
Patient is having a flare up on herpes. Could she get prednisone for her flare up?

## 2014-05-19 NOTE — Telephone Encounter (Signed)
Rx sent 

## 2014-05-23 ENCOUNTER — Other Ambulatory Visit: Payer: Self-pay

## 2014-05-23 MED ORDER — ALPRAZOLAM 1 MG PO TABS: 1.5000 mg | ORAL_TABLET | Freq: Every evening | ORAL | Status: DC | PRN

## 2014-05-30 ENCOUNTER — Ambulatory Visit (INDEPENDENT_AMBULATORY_CARE_PROVIDER_SITE_OTHER): Payer: Federal, State, Local not specified - PPO | Admitting: Family Medicine

## 2014-05-30 ENCOUNTER — Encounter: Payer: Self-pay | Admitting: Family Medicine

## 2014-05-30 VITALS — BP 140/68 | HR 82 | Wt 136.0 lb

## 2014-05-30 DIAGNOSIS — G90523 Complex regional pain syndrome I of lower limb, bilateral: Secondary | ICD-10-CM | POA: Diagnosis not present

## 2014-05-30 DIAGNOSIS — Z720 Tobacco use: Secondary | ICD-10-CM | POA: Diagnosis not present

## 2014-05-30 DIAGNOSIS — F341 Dysthymic disorder: Secondary | ICD-10-CM | POA: Diagnosis not present

## 2014-05-30 DIAGNOSIS — G43009 Migraine without aura, not intractable, without status migrainosus: Secondary | ICD-10-CM | POA: Diagnosis not present

## 2014-05-30 DIAGNOSIS — F17200 Nicotine dependence, unspecified, uncomplicated: Secondary | ICD-10-CM

## 2014-05-30 DIAGNOSIS — G5791 Unspecified mononeuropathy of right lower limb: Secondary | ICD-10-CM

## 2014-05-30 DIAGNOSIS — G5792 Unspecified mononeuropathy of left lower limb: Secondary | ICD-10-CM

## 2014-05-30 DIAGNOSIS — G5793 Unspecified mononeuropathy of bilateral lower limbs: Secondary | ICD-10-CM

## 2014-05-30 MED ORDER — SUMATRIPTAN SUCCINATE 50 MG PO TABS: 50.0000 mg | ORAL_TABLET | ORAL | Status: DC | PRN

## 2014-05-30 MED ORDER — LITHIUM CARBONATE 300 MG PO TABS: 300.0000 mg | ORAL_TABLET | Freq: Two times a day (BID) | ORAL | Status: DC
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
## 2014-06-01 ENCOUNTER — Other Ambulatory Visit: Payer: Self-pay | Admitting: Family Medicine

## 2014-06-01 MED ORDER — VENLAFAXINE HCL ER 37.5 MG PO CP24: ORAL_CAPSULE | ORAL | Status: DC

## 2014-06-01 NOTE — Progress Notes (Signed)
She called and said she thinks she may want to go back on Effexor.  Has 150mg  dose at home. Will send in rx for 37.5mg  and taper up to previous dose.

## 2014-06-12 ENCOUNTER — Encounter: Payer: Self-pay | Admitting: Sports Medicine

## 2014-06-12 ENCOUNTER — Ambulatory Visit (INDEPENDENT_AMBULATORY_CARE_PROVIDER_SITE_OTHER): Payer: Federal, State, Local not specified - PPO | Admitting: Sports Medicine

## 2014-06-12 DIAGNOSIS — M47812 Spondylosis without myelopathy or radiculopathy, cervical region: Secondary | ICD-10-CM | POA: Diagnosis not present

## 2014-06-12 MED ORDER — CARISOPRODOL 350 MG PO TABS: 350.0000 mg | ORAL_TABLET | Freq: Three times a day (TID) | ORAL | Status: DC | PRN

## 2014-06-12 NOTE — Assessment & Plan Note (Signed)
Persistent left-sided C7 radicular symptoms. She does have a C6-7 and C7-T1 disc protrusion on MRI. She has had a couple of epidurals, duration of improvement was only a few hours. She did have a spinal cord stimulator placed 2 months ago, we are unable to obtain any new MRIs. I am going to refill her Soma 1, at this point she is certainly a surgical candidate, probably a 2 level ACDF. I can see her back on an as-needed basis.

## 2014-06-12 NOTE — Progress Notes (Signed)
  Subjective:    CC:  Follow-up  HPI: [name] returns, she has cervical radiculopathy, she has already been through cervical epidurals which only provided temporary relief, she also has significant cervical cephalalgia. Disc protrusions are multilevel at the C6-7 and C7-T1 levels. Also she was recently implanted with a spinal cord stimulator which has significantly helped her low back pain. Unfortunately she continues to have neck pain with radiculopathy and seeks further advice.  Past medical history, Surgical history, Family history not pertinant except as noted below, Social history, Allergies, and medications have been entered into the medical record, reviewed, and no changes needed.   Review of Systems: No fevers, chills, night sweats, weight loss, chest pain, or shortness of breath.   Objective:    General: Well Developed, well nourished, and in no acute distress.  Neuro: Alert and oriented x3, extra-ocular muscles intact, sensation grossly intact.  HEENT: Normocephalic, atraumatic, pupils equal round reactive to light, neck supple, no masses, no lymphadenopathy, thyroid nonpalpable.  Skin: Warm and dry, no rashes. Cardiac: Regular rate and rhythm, no murmurs rubs or gallops, no lower extremity edema.  Respiratory: Clear to auscultation bilaterally. Not using accessory muscles, speaking in full sentences.  Impression and Recommendations:

## 2014-07-11 ENCOUNTER — Other Ambulatory Visit: Payer: Self-pay

## 2014-07-11 MED ORDER — VENLAFAXINE HCL ER 37.5 MG PO CP24: ORAL_CAPSULE | ORAL | Status: DC

## 2014-08-19 ENCOUNTER — Encounter: Payer: Self-pay | Admitting: Family Medicine

## 2014-08-19 MED ORDER — ALPRAZOLAM 1 MG PO TABS: 1.5000 mg | ORAL_TABLET | Freq: Every evening | ORAL | Status: DC | PRN

## 2014-08-26 ENCOUNTER — Encounter: Payer: Self-pay | Admitting: Family Medicine

## 2014-08-26 ENCOUNTER — Ambulatory Visit (INDEPENDENT_AMBULATORY_CARE_PROVIDER_SITE_OTHER): Payer: Federal, State, Local not specified - PPO | Admitting: Family Medicine

## 2014-08-26 ENCOUNTER — Ambulatory Visit (INDEPENDENT_AMBULATORY_CARE_PROVIDER_SITE_OTHER): Payer: Federal, State, Local not specified - PPO

## 2014-08-26 VITALS — BP 112/66 | HR 69 | Wt 138.0 lb

## 2014-08-26 DIAGNOSIS — M542 Cervicalgia: Secondary | ICD-10-CM | POA: Diagnosis not present

## 2014-08-26 DIAGNOSIS — R1310 Dysphagia, unspecified: Secondary | ICD-10-CM | POA: Diagnosis not present

## 2014-08-26 NOTE — Progress Notes (Signed)
   Subjective:    Patient ID: [name], female    DOB: 1970/11/23, 44 y.o.   MRN: [id number]  HPI  L side x 4 wks with cough hurts to swallow she reports that she was taking predisone x 5 days denies any fevers, chills, sweats. No fever. No food getting stuck. Some mild left ear. + post nasal drip. No other cold sxs. No discomfort with ROM. No hx of thyroid problems. Noticing swallow pain is more frequent. She is a smoker.  Review of Systems     Objective:   Physical Exam  Constitutional: She is oriented to person, place, and time. She appears well-developed and well-nourished.  HENT:  Head: Normocephalic and atraumatic.  Right Ear: External ear normal.  Left Ear: External ear normal.  Nose: Nose normal.  Mouth/Throat: Oropharynx is clear and moist.  TMs and canals are clear.   Eyes: Conjunctivae and EOM are normal. Pupils are equal, round, and reactive to light.  Neck: Neck supple. No thyromegaly present.    Cardiovascular: Normal rate, regular rhythm and normal heart sounds.   Pulmonary/Chest: Effort normal and breath sounds normal. She has no wheezes.  Lymphadenopathy:    She has cervical adenopathy.  Neurological: She is alert and oriented to person, place, and time.  Skin: Skin is warm and dry.  Psychiatric: She has a normal mood and affect.          Assessment & Plan:  Left neck pain-unclear etiology at this point. She does have some Molly enlarged cervical lymph nodes but they are bilateral even though her pain is only on the left. Will get a CBC as well as an ultrasound for further evaluation. I do not palpate any abnormalities in the thyroid gland but I do want to take a look at this on ultrasound as well since she is having a pressure sensation when she swallows.

## 2014-08-27 ENCOUNTER — Other Ambulatory Visit: Payer: Self-pay | Admitting: Family Medicine

## 2014-08-27 LAB — CBC WITH DIFFERENTIAL/PLATELET
Basophils Absolute: 0 10*3/uL (ref 0.0–0.1)
Basophils Relative: 0 % (ref 0–1)
EOS PCT: 1 % (ref 0–5)
Eosinophils Absolute: 0.1 10*3/uL (ref 0.0–0.7)
HCT: 40.1 % (ref 36.0–46.0)
Hemoglobin: 13.8 g/dL (ref 12.0–15.0)
Lymphocytes Relative: 35 % (ref 12–46)
Lymphs Abs: 3 10*3/uL (ref 0.7–4.0)
MCH: 30.5 pg (ref 26.0–34.0)
MCHC: 34.4 g/dL (ref 30.0–36.0)
MCV: 88.7 fL (ref 78.0–100.0)
MONOS PCT: 8 % (ref 3–12)
MPV: 9.5 fL (ref 8.6–12.4)
Monocytes Absolute: 0.7 10*3/uL (ref 0.1–1.0)
Neutro Abs: 4.8 10*3/uL (ref 1.7–7.7)
Neutrophils Relative %: 56 % (ref 43–77)
PLATELETS: 308 10*3/uL (ref 150–400)
RBC: 4.52 MIL/uL (ref 3.87–5.11)
RDW: 13.2 % (ref 11.5–15.5)
WBC: 8.6 10*3/uL (ref 4.0–10.5)

## 2014-08-27 MED ORDER — AZITHROMYCIN 250 MG PO TABS: ORAL_TABLET | ORAL | Status: DC

## 2014-09-09 ENCOUNTER — Encounter: Payer: Self-pay | Admitting: Family Medicine

## 2014-09-09 MED ORDER — FLUCONAZOLE 150 MG PO TABS: 150.0000 mg | ORAL_TABLET | Freq: Every day | ORAL | Status: DC

## 2014-09-09 NOTE — Telephone Encounter (Signed)
Lets call [facility]. They should have another 90 days on the Effexo. I will send in [place] as well.  Please route message back when done.

## 2014-09-11 ENCOUNTER — Other Ambulatory Visit: Payer: Self-pay | Admitting: Physician Assistant

## 2014-10-10 ENCOUNTER — Encounter: Payer: Self-pay | Admitting: Family Medicine

## 2014-10-10 ENCOUNTER — Ambulatory Visit (INDEPENDENT_AMBULATORY_CARE_PROVIDER_SITE_OTHER): Payer: Federal, State, Local not specified - PPO | Admitting: Family Medicine

## 2014-10-10 VITALS — BP 139/77 | HR 67 | Wt 135.0 lb

## 2014-10-10 DIAGNOSIS — M898X9 Other specified disorders of bone, unspecified site: Secondary | ICD-10-CM

## 2014-10-10 DIAGNOSIS — M899 Disorder of bone, unspecified: Secondary | ICD-10-CM

## 2014-10-10 DIAGNOSIS — K2100 Gastro-esophageal reflux disease with esophagitis, without bleeding: Secondary | ICD-10-CM

## 2014-10-10 DIAGNOSIS — G8929 Other chronic pain: Secondary | ICD-10-CM

## 2014-10-10 DIAGNOSIS — M542 Cervicalgia: Secondary | ICD-10-CM | POA: Diagnosis not present

## 2014-10-10 DIAGNOSIS — R590 Localized enlarged lymph nodes: Secondary | ICD-10-CM | POA: Diagnosis not present

## 2014-10-10 DIAGNOSIS — F341 Dysthymic disorder: Secondary | ICD-10-CM

## 2014-10-10 MED ORDER — ACYCLOVIR 400 MG PO TABS: 400.0000 mg | ORAL_TABLET | Freq: Three times a day (TID) | ORAL | Status: DC

## 2014-10-10 MED ORDER — VENLAFAXINE HCL ER 150 MG PO CP24: 150.0000 mg | ORAL_CAPSULE | Freq: Every day | ORAL | Status: DC

## 2014-10-10 MED ORDER — PREDNISONE 10 MG PO TABS: 50.0000 mg | ORAL_TABLET | Freq: Every day | ORAL | Status: DC
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
## 2014-10-11 LAB — URIC ACID: Uric Acid, Serum: 3 mg/dL (ref 2.4–7.0)

## 2014-10-11 LAB — CBC WITH DIFFERENTIAL/PLATELET
Basophils Absolute: 0 10*3/uL (ref 0.0–0.1)
Basophils Relative: 0 % (ref 0–1)
Eosinophils Absolute: 0.1 10*3/uL (ref 0.0–0.7)
Eosinophils Relative: 1 % (ref 0–5)
HCT: 42.7 % (ref 36.0–46.0)
Hemoglobin: 14.6 g/dL (ref 12.0–15.0)
Lymphocytes Relative: 34 % (ref 12–46)
Lymphs Abs: 2.5 10*3/uL (ref 0.7–4.0)
MCH: 30.3 pg (ref 26.0–34.0)
MCHC: 34.2 g/dL (ref 30.0–36.0)
MCV: 88.6 fL (ref 78.0–100.0)
MPV: 9.9 fL (ref 8.6–12.4)
Monocytes Absolute: 0.7 10*3/uL (ref 0.1–1.0)
Monocytes Relative: 9 % (ref 3–12)
Neutro Abs: 4.1 10*3/uL (ref 1.7–7.7)
Neutrophils Relative %: 56 % (ref 43–77)
Platelets: 362 10*3/uL (ref 150–400)
RBC: 4.82 MIL/uL (ref 3.87–5.11)
RDW: 13.2 % (ref 11.5–15.5)
WBC: 7.3 10*3/uL (ref 4.0–10.5)

## 2014-10-11 LAB — TSH: TSH: 1.034 u[IU]/mL (ref 0.350–4.500)

## 2014-10-11 LAB — HIGH SENSITIVITY CRP: CRP HIGH SENSITIVITY: 0.5 mg/L

## 2014-10-11 LAB — VITAMIN D 25 HYDROXY (VIT D DEFICIENCY, FRACTURES): Vit D, 25-Hydroxy: 19 ng/mL — ABNORMAL LOW (ref 30–100)

## 2014-10-11 LAB — RHEUMATOID FACTOR

## 2014-10-12 ENCOUNTER — Telehealth: Payer: Self-pay | Admitting: Family Medicine

## 2014-10-12 MED ORDER — PROMETHAZINE HCL 25 MG PO TABS: ORAL_TABLET | ORAL | Status: DC

## 2014-10-12 NOTE — Telephone Encounter (Signed)
Pt called having inc in nausea and GI issues. Would like phenergan refilled. Says plans to call Dr. Suella Grove, her GI doc this week to try ot get in.

## 2014-10-13 LAB — CYCLIC CITRUL PEPTIDE ANTIBODY, IGG: Cyclic Citrullin Peptide Ab: <2 U/mL (ref 0.0–5.0)

## 2014-10-13 LAB — ANA: ANA: NEGATIVE

## 2014-10-21 ENCOUNTER — Ambulatory Visit (INDEPENDENT_AMBULATORY_CARE_PROVIDER_SITE_OTHER): Payer: Federal, State, Local not specified - PPO

## 2014-10-21 DIAGNOSIS — M898X9 Other specified disorders of bone, unspecified site: Secondary | ICD-10-CM

## 2014-10-21 DIAGNOSIS — R05 Cough: Secondary | ICD-10-CM | POA: Diagnosis not present

## 2014-10-21 DIAGNOSIS — R221 Localized swelling, mass and lump, neck: Secondary | ICD-10-CM

## 2014-10-21 DIAGNOSIS — R1310 Dysphagia, unspecified: Secondary | ICD-10-CM

## 2014-10-21 DIAGNOSIS — R590 Localized enlarged lymph nodes: Secondary | ICD-10-CM

## 2014-10-21 MED ORDER — IOHEXOL 300 MG/ML  SOLN
75.0000 mL | Freq: Once | INTRAMUSCULAR | Status: AC | PRN
  Administered 2014-10-21: 75 mL via INTRAVENOUS

## 2014-10-23 MED ORDER — ATORVASTATIN CALCIUM 20 MG PO TABS: 20.0000 mg | ORAL_TABLET | Freq: Every day | ORAL | Status: DC

## 2014-11-20 ENCOUNTER — Ambulatory Visit: Payer: Federal, State, Local not specified - PPO | Admitting: Family Medicine

## 2014-11-27 ENCOUNTER — Encounter: Payer: Self-pay | Admitting: Family Medicine

## 2014-12-01 ENCOUNTER — Encounter: Payer: Self-pay | Admitting: Family Medicine

## 2014-12-01 ENCOUNTER — Ambulatory Visit (INDEPENDENT_AMBULATORY_CARE_PROVIDER_SITE_OTHER): Payer: Federal, State, Local not specified - PPO | Admitting: Family Medicine

## 2014-12-01 ENCOUNTER — Telehealth: Payer: Self-pay | Admitting: Family Medicine

## 2014-12-01 VITALS — BP 125/70 | HR 71 | Wt 139.0 lb

## 2014-12-01 DIAGNOSIS — Z720 Tobacco use: Secondary | ICD-10-CM | POA: Diagnosis not present

## 2014-12-01 DIAGNOSIS — Z79899 Other long term (current) drug therapy: Secondary | ICD-10-CM

## 2014-12-01 DIAGNOSIS — Z1231 Encounter for screening mammogram for malignant neoplasm of breast: Secondary | ICD-10-CM

## 2014-12-01 DIAGNOSIS — K2100 Gastro-esophageal reflux disease with esophagitis, without bleeding: Secondary | ICD-10-CM

## 2014-12-01 DIAGNOSIS — I6529 Occlusion and stenosis of unspecified carotid artery: Secondary | ICD-10-CM

## 2014-12-01 DIAGNOSIS — R590 Localized enlarged lymph nodes: Secondary | ICD-10-CM

## 2014-12-01 MED ORDER — NICOTINE 21 MG/24HR TD PT24: 21.0000 mg | MEDICATED_PATCH | Freq: Every day | TRANSDERMAL | Status: DC

## 2014-12-01 MED ORDER — NICOTINE POLACRILEX 2 MG MT GUM: 2.0000 mg | CHEWING_GUM | OROMUCOSAL | Status: DC | PRN

## 2014-12-01 NOTE — Progress Notes (Signed)
   Subjective:    Patient ID: [name], female    DOB: 06-Feb-1971, 44 y.o.   MRN: [id number]  HPI  GERD- had her scope and esophageal dilation. On PPI.  She has cut out soda completley and that has helped. She was also started on nortriptyline in the evenings to help with the muscle spasms. She is following up in 2-3 months with GI.     tobacco abuse. Would like to quit. Smoking about 1ppd.  Says she knows needs to quit. Tried Chantix in past and caused severe change in mood. Has never been on Wellubutrin ot tried nicotine replacement.    Follow-up lymphadenopathy-she still has some mild distalanterior cervical area bilaterally. She says they really haven't change. Occasionally feels sore occasionally sore to swallow. She does not feel like they have gotten smaller or gotten larger. We actually recently did a CT of the neck just to make sure there were no pathologic features and everything looked benign.  Review of Systems     Objective:   Physical Exam  Constitutional: She is oriented to person, place, and time. She appears well-developed and well-nourished.  HENT:  Head: Normocephalic and atraumatic.  Neck: Neck supple. No thyromegaly present.  Still small but firm ( not hard)  approx 1 cm LN ant cerv chain on righ and left.   Cardiovascular: Normal rate, regular rhythm and normal heart sounds.   Pulmonary/Chest: Effort normal and breath sounds normal.  Lymphadenopathy:    She has cervical adenopathy.  Neurological: She is alert and oriented to person, place, and time.  Skin: Skin is warm and dry.  Psychiatric: She has a normal mood and affect. Her behavior is normal.          Assessment & Plan:  Needs mammogram screening. discussed 3-D mammogram may be preferred bc of her fibrocystic breast hx.    Stomach spasms-now on nortriptyline.  She was also noted on CT to have some premature carotid artery atherosclerosis. We discussed the importance of smoking cessation.  She's also started a statin. She has noticed a little bit more achiness on it but is willing to continue it for now.  tob abuse - Dicussed cessation and options. She did not tolerate Chantix. Will start with nicotine replacement. INfo given for patches with gum PRN. Explained how to use the patches and what the starting doses. And how to use the gum as needed. Encouraged her to set a quit date.  GERD- Congratulated her on quitting drinking soda!! Now just neet to work on the smoking.   Lymphadenopathy, cervical-stable with no changes and no pathologic features on CT. We'll just continue to follow periodically.

## 2014-12-01 NOTE — Telephone Encounter (Signed)
Please call downstairs and see if they do 3D mammo or if only at [facility] in [place].  If so what is the mammo code to order?

## 2014-12-02 LAB — COMPLETE METABOLIC PANEL WITH GFR
ALBUMIN: 4.6 g/dL (ref 3.5–5.2)
ALT: 21 U/L (ref 0–35)
AST: 16 U/L (ref 0–37)
Alkaline Phosphatase: 79 U/L (ref 39–117)
BUN: 8 mg/dL (ref 6–23)
CO2: 29 meq/L (ref 19–32)
Calcium: 9.2 mg/dL (ref 8.4–10.5)
Chloride: 103 mEq/L (ref 96–112)
Creat: 0.6 mg/dL (ref 0.50–1.10)
GFR, Est Non African American: >89 mL/min
Glucose, Bld: 110 mg/dL — ABNORMAL HIGH (ref 70–99)
Potassium: 4.8 mEq/L (ref 3.5–5.3)
SODIUM: 140 meq/L (ref 135–145)
TOTAL PROTEIN: 7 g/dL (ref 6.0–8.3)
Total Bilirubin: 0.5 mg/dL (ref 0.2–1.2)

## 2014-12-02 LAB — LIPID PANEL
CHOL/HDL RATIO: 1.6 ratio
CHOLESTEROL: 185 mg/dL (ref 0–200)
HDL: 113 mg/dL (ref >=46)
LDL CALC: 64 mg/dL (ref 0–99)
TRIGLYCERIDES: 39 mg/dL (ref <150)
VLDL: 8 mg/dL (ref 0–40)

## 2014-12-02 NOTE — Telephone Encounter (Signed)
3D Mammogram can be done at [facility] or [facility] hospital. The order is pending. To order a 3D Mammogram you order tomosynthesis.

## 2014-12-03 ENCOUNTER — Encounter: Payer: Self-pay | Admitting: Family Medicine

## 2014-12-03 DIAGNOSIS — I6529 Occlusion and stenosis of unspecified carotid artery: Secondary | ICD-10-CM | POA: Insufficient documentation

## 2014-12-08 ENCOUNTER — Other Ambulatory Visit: Payer: Self-pay | Admitting: Family Medicine

## 2014-12-08 DIAGNOSIS — Z1231 Encounter for screening mammogram for malignant neoplasm of breast: Secondary | ICD-10-CM

## 2014-12-15 ENCOUNTER — Other Ambulatory Visit: Payer: Self-pay | Admitting: *Deleted

## 2014-12-15 ENCOUNTER — Encounter: Payer: Self-pay | Admitting: Family Medicine

## 2014-12-15 MED ORDER — ALPRAZOLAM 1 MG PO TABS: 1.5000 mg | ORAL_TABLET | Freq: Every evening | ORAL | Status: DC | PRN

## 2014-12-24 ENCOUNTER — Encounter: Payer: Self-pay | Admitting: Family Medicine

## 2014-12-24 DIAGNOSIS — K5900 Constipation, unspecified: Secondary | ICD-10-CM | POA: Insufficient documentation

## 2015-01-01 ENCOUNTER — Ambulatory Visit
Admission: RE | Admit: 2015-01-01 | Discharge: 2015-01-01 | Disposition: A | Discharge status: Home / Self Care | Payer: Federal, State, Local not specified - PPO | Source: Ambulatory Visit | Attending: Family Medicine | Admitting: Family Medicine

## 2015-01-01 DIAGNOSIS — Z1231 Encounter for screening mammogram for malignant neoplasm of breast: Secondary | ICD-10-CM

## 2015-03-25 ENCOUNTER — Telehealth: Payer: Self-pay | Admitting: Family Medicine

## 2015-03-25 NOTE — Telephone Encounter (Signed)
Medication is no longer on her medication list. She is due for a follow up appointment. I will send message to Dr Madilyn Fireman for her approval.

## 2015-03-25 NOTE — Telephone Encounter (Signed)
Pt needs her Adderall Script and I will take it to her this week. Thanks, [employer]

## 2015-03-26 NOTE — Telephone Encounter (Signed)
She needs appt since a scheduled drug and hasn't been on it in a year. Patient notified.

## 2015-03-27 ENCOUNTER — Ambulatory Visit: Payer: Federal, State, Local not specified - PPO | Admitting: Family Medicine

## 2015-04-03 ENCOUNTER — Ambulatory Visit: Payer: Federal, State, Local not specified - PPO | Admitting: Family Medicine

## 2015-04-06 ENCOUNTER — Encounter: Payer: Self-pay | Admitting: Family Medicine

## 2015-04-06 ENCOUNTER — Ambulatory Visit (INDEPENDENT_AMBULATORY_CARE_PROVIDER_SITE_OTHER): Payer: Federal, State, Local not specified - PPO

## 2015-04-06 ENCOUNTER — Ambulatory Visit (INDEPENDENT_AMBULATORY_CARE_PROVIDER_SITE_OTHER): Payer: Federal, State, Local not specified - PPO | Admitting: Family Medicine

## 2015-04-06 VITALS — BP 120/78 | HR 67 | Wt 142.0 lb

## 2015-04-06 DIAGNOSIS — M25561 Pain in right knee: Secondary | ICD-10-CM | POA: Diagnosis not present

## 2015-04-06 DIAGNOSIS — Z720 Tobacco use: Secondary | ICD-10-CM | POA: Diagnosis not present

## 2015-04-06 DIAGNOSIS — F909 Attention-deficit hyperactivity disorder, unspecified type: Secondary | ICD-10-CM | POA: Diagnosis not present

## 2015-04-06 DIAGNOSIS — F988 Other specified behavioral and emotional disorders with onset usually occurring in childhood and adolescence: Secondary | ICD-10-CM | POA: Insufficient documentation

## 2015-04-06 MED ORDER — AMPHETAMINE-DEXTROAMPHETAMINE 20 MG PO TABS: 20.0000 mg | ORAL_TABLET | Freq: Two times a day (BID) | ORAL | Status: DC

## 2015-04-06 MED ORDER — ALPRAZOLAM 1 MG PO TABS: 1.5000 mg | ORAL_TABLET | Freq: Every evening | ORAL | Status: DC | PRN

## 2015-04-06 MED ORDER — TRAMADOL HCL 50 MG PO TABS: 50.0000 mg | ORAL_TABLET | Freq: Two times a day (BID) | ORAL | Status: DC | PRN

## 2015-04-06 NOTE — Progress Notes (Signed)
   Subjective:    Patient ID: [name], female    DOB: January 12, 1971, 44 y.o.   MRN: [id number]  HPI ADD - Her step father is on hospice and has been given about 30 days to live.  She has a lot going on and is having a really hard time staying organize things for him .  No recent CP.  + Occ flutters.  She is taken medication for years and did well. She came off about a year ago after she was no longer working. The she admits that she actually still takes it occasionally and it does make a big difference.    tob abuse - she has started smoking again.  She quit using the patches.    Right knee pain x 1 week. Has been swelling. Pain is 8/10.  She has been using Tylenol arthritis, IBU and capsacian .  Says has had 2 surgeris on it. paiful going up and own the steps and walking in general.  Says painful with full extension and with full flexion. Says pain is keeping her awake t night.    Review of Systems     Objective:   Physical Exam  Constitutional: She is oriented to person, place, and time. She appears well-developed and well-nourished.  HENT:  Head: Normocephalic and atraumatic.  Cardiovascular: Normal rate, regular rhythm and normal heart sounds.   Pulmonary/Chest: Effort normal and breath sounds normal.  Musculoskeletal:  Right knee with normal extension but decreased flexion. She doesn't eyes any pain around the patella or the patellar tendon. She does have some tenderness along the lateral joint line. Also with extension she has a promise popping. The patella seems to stay in place for the most part. She does report a prior history of a lateral release. Some trace swelling in the knee. Negative McMurray's. Strength is 5 out of 5 with flexion and extension. No discomfort or pain with valgus or varus stress. No increased laxity with anterior drawer test.  Neurological: She is alert and oriented to person, place, and time.  Skin: Skin is warm and dry.  Psychiatric: She has a normal  mood and affect. Her behavior is normal.          Assessment & Plan:  ADD - will restart medication.  Follow-up in about 4-6 months. She preferred to use the short release twice a day instead of extend release by did offer this to her. Stop immediately if any chest pain or short of breath or side effects. We'll continue to monitor blood pressure carefully.  tob abuse - she is smoking again. Will try again later when she is feelig better.    Right knee pain-we'll start with x-ray today. She denies any specific falls that she thinks may have injured the knee but she has fallen several times this year in general. She has had prior surgeries as well and was 20 1. that she may actually need a knee replacement but to try to hold off as long as possible. We'll get an x-ray today. Could consider Visco as injections. Given tramadol as needed for pain relief since the IV prevent Tylenol are not helping.

## 2015-04-07 ENCOUNTER — Telehealth: Payer: Self-pay | Admitting: Family Medicine

## 2015-04-07 NOTE — Telephone Encounter (Signed)
Received fax for prior authorization on Adderall 20 mg tablets sent through cover my meds and received authorization valid from 02/06/2015 -04/06/2016.  [id number]. - CF

## 2015-04-08 ENCOUNTER — Other Ambulatory Visit: Payer: Self-pay | Admitting: *Deleted

## 2015-04-08 DIAGNOSIS — M25561 Pain in right knee: Secondary | ICD-10-CM

## 2015-04-09 ENCOUNTER — Telehealth: Payer: Self-pay | Admitting: Family Medicine

## 2015-04-09 MED ORDER — VENLAFAXINE HCL ER 150 MG PO CP24: 150.0000 mg | ORAL_CAPSULE | Freq: Every day | ORAL | Status: DC

## 2015-04-09 NOTE — Telephone Encounter (Signed)
Pt forgot to get her Effexor refilled while she was here last week. Will you please call it in for her to [facility] in [place]

## 2015-04-09 NOTE — Telephone Encounter (Signed)
Medication sent to pharmacy  

## 2015-04-28 ENCOUNTER — Other Ambulatory Visit: Payer: Self-pay | Admitting: Family Medicine

## 2015-04-28 ENCOUNTER — Other Ambulatory Visit: Payer: Self-pay | Admitting: Sports Medicine

## 2015-04-28 MED ORDER — OMEPRAZOLE 40 MG PO CPDR: 40.0000 mg | DELAYED_RELEASE_CAPSULE | ORAL | Status: DC

## 2015-04-28 MED ORDER — CYCLOBENZAPRINE HCL 10 MG PO TABS: ORAL_TABLET | ORAL | Status: DC

## 2015-04-28 NOTE — Telephone Encounter (Signed)
Patient calls requesting refill on soma, advised we would be doing only Flexeril, no controlled substances.

## 2015-04-30 ENCOUNTER — Ambulatory Visit (INDEPENDENT_AMBULATORY_CARE_PROVIDER_SITE_OTHER): Payer: Federal, State, Local not specified - PPO | Admitting: Sports Medicine

## 2015-04-30 ENCOUNTER — Encounter: Payer: Self-pay | Admitting: Sports Medicine

## 2015-04-30 DIAGNOSIS — M47812 Spondylosis without myelopathy or radiculopathy, cervical region: Secondary | ICD-10-CM

## 2015-04-30 MED ORDER — NORTRIPTYLINE HCL 25 MG PO CAPS: 25.0000 mg | ORAL_CAPSULE | Freq: Three times a day (TID) | ORAL | Status: DC

## 2015-04-30 MED ORDER — TRAMADOL HCL 50 MG PO TABS: 100.0000 mg | ORAL_TABLET | Freq: Three times a day (TID) | ORAL | Status: DC

## 2015-04-30 NOTE — Assessment & Plan Note (Signed)
Persistent left-sided radiculopathy with periscapular symptoms, and 2 level disc protrusions on MRI and CT. She currently does have a lumbar spinal stimulator that has improved her lumbar symptoms however continues to have left-sided cervical spasm with radiculopathy. Trigger point injections 3 today, increasing tramadol to 100 mg 3 times a day, and referral to spine surgery, doubling nortriptyline.

## 2015-04-30 NOTE — Progress Notes (Signed)
  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 It is now periscapular and upper shoulder, we have tried epidurals, physical therapy, nerve blocking agents, unfortunately she has continued to have pain. She is post-lumbar spinal cord stimulator placement. She has seen a spine surgeon who did not think her nocturia candidate at the time of her visit. Symptoms are severe, persistent.  Past medical history, Surgical history, Family history not pertinant except as noted below, Social history, Allergies, and medications have been entered into the medical record, reviewed, and no changes needed.   Review of Systems: No fevers, chills, night sweats, weight loss, chest pain, or shortness of breath.   Objective:    General: Well Developed, well nourished, and in no acute distress.  Neuro: Alert and oriented x3, extra-ocular muscles intact, sensation grossly intact.  HEENT: Normocephalic, atraumatic, pupils equal round reactive to light, neck supple, no masses, no lymphadenopathy, thyroid nonpalpable.  Skin: Warm and dry, no rashes. Cardiac: Regular rate and rhythm, no murmurs rubs or gallops, no lower extremity edema.  Respiratory: Clear to auscultation bilaterally. Not using accessory muscles, speaking in full sentences. Neck: Negative spurling's Severely limited in range of motion with left sided rotation to approximately 5 Grip strength and sensation normal in bilateral hands Strength good C4 to T1 distribution No sensory change to C4 to T1 Reflexes normal Multiple areas of tenderness over the left trapezius and rhomboids  Procedure:  Injection of 3 left-sided paracervical trigger point's and trapezial trigger points Consent obtained and verified. Time-out conducted. Noted no overlying erythema, induration, or other signs of local infection. Skin prepped in a  sterile fashion. Topical analgesic spray: Ethyl chloride. Completed without difficulty. Meds: Total of 1 mL Kenalog 40, 5 mL lidocaine spread out between 3 trigger points. Pain immediately improved suggesting accurate placement of the medication. Advised to call if fevers/chills, erythema, induration, drainage, or persistent bleeding.  Impression and Recommendations:

## 2015-05-13 ENCOUNTER — Other Ambulatory Visit: Payer: Self-pay | Admitting: Sports Medicine

## 2015-05-13 DIAGNOSIS — M47812 Spondylosis without myelopathy or radiculopathy, cervical region: Secondary | ICD-10-CM

## 2015-05-31 ENCOUNTER — Other Ambulatory Visit: Payer: Self-pay | Admitting: Family Medicine

## 2015-06-09 ENCOUNTER — Other Ambulatory Visit: Payer: Self-pay

## 2015-06-09 MED ORDER — ACYCLOVIR 400 MG PO TABS: 400.0000 mg | ORAL_TABLET | Freq: Three times a day (TID) | ORAL | Status: DC

## 2015-06-10 ENCOUNTER — Other Ambulatory Visit: Payer: Self-pay | Admitting: Family Medicine

## 2015-06-11 ENCOUNTER — Ambulatory Visit
Admission: RE | Admit: 2015-06-11 | Discharge: 2015-06-11 | Disposition: A | Discharge status: Home / Self Care | Payer: Federal, State, Local not specified - PPO | Source: Ambulatory Visit | Attending: Sports Medicine | Admitting: Sports Medicine

## 2015-06-11 MED ORDER — TRIAMCINOLONE ACETONIDE 40 MG/ML IJ SUSP (RADIOLOGY)
60.0000 mg | Freq: Once | INTRAMUSCULAR | Status: AC
  Administered 2015-06-11: 60 mg via EPIDURAL

## 2015-06-11 MED ORDER — IOHEXOL 300 MG/ML  SOLN
1.0000 mL | Freq: Once | INTRAMUSCULAR | Status: DC | PRN
  Administered 2015-06-11: 1 mL via EPIDURAL

## 2015-06-11 NOTE — Discharge Instructions (Signed)

## 2015-06-29 ENCOUNTER — Ambulatory Visit: Payer: Federal, State, Local not specified - PPO | Admitting: Family Medicine

## 2015-07-26 HISTORY — PX: COLONOSCOPY: SHX174

## 2015-08-10 ENCOUNTER — Other Ambulatory Visit: Payer: Self-pay | Admitting: Family Medicine

## 2015-08-10 MED ORDER — SUMATRIPTAN SUCCINATE 50 MG PO TABS: ORAL_TABLET | ORAL | Status: DC

## 2015-08-14 ENCOUNTER — Ambulatory Visit (INDEPENDENT_AMBULATORY_CARE_PROVIDER_SITE_OTHER): Payer: Federal, State, Local not specified - PPO | Admitting: Family Medicine

## 2015-08-14 VITALS — Temp 98.2°F

## 2015-08-14 DIAGNOSIS — Z23 Encounter for immunization: Secondary | ICD-10-CM

## 2015-08-14 NOTE — Progress Notes (Signed)
   Subjective:    Patient ID: [name], female    DOB: October 09, 1970, 45 y.o.   MRN: [id number]  HPI  [name] is here for a tetanus vaccine. She punctured her 4 th right hand.   Review of Systems     Objective:   Physical Exam        Assessment & Plan:  Patient tolerated injection well without complications.

## 2015-08-19 ENCOUNTER — Encounter: Payer: Self-pay | Admitting: Family Medicine

## 2015-08-20 ENCOUNTER — Other Ambulatory Visit: Payer: Self-pay

## 2015-08-20 MED ORDER — ALPRAZOLAM 1 MG PO TABS: 1.5000 mg | ORAL_TABLET | Freq: Every evening | ORAL | Status: DC | PRN

## 2015-08-25 ENCOUNTER — Ambulatory Visit: Payer: Federal, State, Local not specified - PPO | Admitting: Family Medicine

## 2015-09-08 ENCOUNTER — Ambulatory Visit (INDEPENDENT_AMBULATORY_CARE_PROVIDER_SITE_OTHER): Payer: Federal, State, Local not specified - PPO | Admitting: Family Medicine

## 2015-09-08 ENCOUNTER — Encounter: Payer: Self-pay | Admitting: Family Medicine

## 2015-09-08 ENCOUNTER — Ambulatory Visit (INDEPENDENT_AMBULATORY_CARE_PROVIDER_SITE_OTHER): Payer: Federal, State, Local not specified - PPO

## 2015-09-08 VITALS — BP 121/76 | HR 71 | Wt 148.0 lb

## 2015-09-08 DIAGNOSIS — G43009 Migraine without aura, not intractable, without status migrainosus: Secondary | ICD-10-CM

## 2015-09-08 DIAGNOSIS — M25561 Pain in right knee: Secondary | ICD-10-CM

## 2015-09-08 DIAGNOSIS — M25551 Pain in right hip: Secondary | ICD-10-CM | POA: Diagnosis not present

## 2015-09-08 DIAGNOSIS — F909 Attention-deficit hyperactivity disorder, unspecified type: Secondary | ICD-10-CM

## 2015-09-08 DIAGNOSIS — F988 Other specified behavioral and emotional disorders with onset usually occurring in childhood and adolescence: Secondary | ICD-10-CM

## 2015-09-08 DIAGNOSIS — M898X6 Other specified disorders of bone, lower leg: Secondary | ICD-10-CM

## 2015-09-08 MED ORDER — ALPRAZOLAM 1 MG PO TABS: 1.5000 mg | ORAL_TABLET | Freq: Every evening | ORAL | Status: DC | PRN

## 2015-09-08 MED ORDER — ACYCLOVIR 400 MG PO TABS: 400.0000 mg | ORAL_TABLET | Freq: Three times a day (TID) | ORAL | Status: DC

## 2015-09-08 MED ORDER — SUMATRIPTAN SUCCINATE 50 MG PO TABS: ORAL_TABLET | ORAL | Status: DC

## 2015-09-08 MED ORDER — AMPHETAMINE-DEXTROAMPHETAMINE 20 MG PO TABS: 20.0000 mg | ORAL_TABLET | Freq: Two times a day (BID) | ORAL | Status: DC

## 2015-09-08 MED ORDER — VENLAFAXINE HCL ER 150 MG PO CP24: 150.0000 mg | ORAL_CAPSULE | Freq: Every day | ORAL | Status: DC

## 2015-09-08 MED ORDER — ATORVASTATIN CALCIUM 20 MG PO TABS: 20.0000 mg | ORAL_TABLET | Freq: Every day | ORAL | Status: DC

## 2015-09-08 MED ORDER — DICLOFENAC SODIUM 2 % TD SOLN: TRANSDERMAL | Status: DC

## 2015-09-08 MED ORDER — OMEPRAZOLE 40 MG PO CPDR: 40.0000 mg | DELAYED_RELEASE_CAPSULE | ORAL | Status: DC

## 2015-09-08 NOTE — Progress Notes (Signed)
   Subjective:    Patient ID: [name], female    DOB: July 12, 1971, 45 y.o.   MRN: [id number]  HPI Right knee pain radiating towards her ankle.  Hard to fully flex her knee. Has been using Capsacin, Bengay, [place], Icey-Hot.  She also c/o of pain over teh mid-tibia.  Says has fallen multiple times and has injured that area.  It has stayed really sore. Most of her knee pain is around teh lateral portion of the patella.    ADD - Doing well on Adderall. No palps, CP or SOB.  Tolerating medication well.  Still taking it as needed.    Migraine HA - Needs refill on her imitrex.  Has gone through all of her imitrex for January.    Right hip pain x 3 days.  Says she has been helping her mom move and lift furniture ash her mom has had to move.  Says when she was leaving her house felt a "catch"  In her right groin that was worse with walking and movement.  Says her bilat hip burisitis has been acting up as well.    Review of Systems     Objective:   Physical Exam  Constitutional: She is oriented to person, place, and time. She appears well-developed and well-nourished.  HENT:  Head: Normocephalic and atraumatic.  Cardiovascular: Normal rate, regular rhythm and normal heart sounds.   Pulmonary/Chest: Effort normal and breath sounds normal.  Neurological: She is alert and oriented to person, place, and time.  Skin: Skin is warm and dry.  Psychiatric: She has a normal mood and affect. Her behavior is normal.          Assessment & Plan:  Right knee pain - possible meniscal tear vs OA based on exam and description of pain.  She has had right lateral release on that knee.    Right lower leg pain discussed possibly getting x-ray and maybe even ultrasound to evaluate the blood vessels.  ADD - BP well controlled.  Doing well. RF for 90 days given. F/U in 4 months.   Right hip pain- xray reveals some mild OA.  If pain gets worse consider avascular necrosis of femoral head.  Work on  strengthening for hip flexors.    Migaine HA. - has been flaring more with recent stressors.    Hyperlipidemia - currently on Lipitor and tolerating it well.

## 2015-09-09 ENCOUNTER — Encounter: Payer: Self-pay | Admitting: Family Medicine

## 2015-09-17 ENCOUNTER — Telehealth: Payer: Self-pay | Admitting: Family Medicine

## 2015-09-17 NOTE — Telephone Encounter (Signed)
Patient wants to know if you will call her something different in? Her Effexor is not working and she is dealing with her grandmother dying. Call patient and let her know. Thanks

## 2015-09-18 ENCOUNTER — Other Ambulatory Visit (HOSPITAL_BASED_OUTPATIENT_CLINIC_OR_DEPARTMENT_OTHER): Payer: Federal, State, Local not specified - PPO

## 2015-09-18 ENCOUNTER — Ambulatory Visit (HOSPITAL_BASED_OUTPATIENT_CLINIC_OR_DEPARTMENT_OTHER): Payer: Federal, State, Local not specified - PPO

## 2015-09-18 MED ORDER — BUPROPION HCL ER (XL) 150 MG PO TB24: 150.0000 mg | ORAL_TABLET | Freq: Every day | ORAL | Status: DC

## 2015-09-18 NOTE — Telephone Encounter (Signed)
We could consider adding Wellbutrin to the Effexor for a time. I know she took Wellbutrin back in 2013 but I can't remember if it was in combination or if it was just by itself but sometimes if we added to her current regimen it just gives a little booster on controlling symptoms. See what she thinks about this. Please tell her I'm sorry about her grandmother.

## 2015-09-18 NOTE — Telephone Encounter (Signed)
Patient states that she is willing to try Wellbutrin PLEASE call that into [facility],  and she also wanted to let you know she canceled her doppler because it is 381.00 out of pocket but she is going to have her Xray done. Thanks so much

## 2015-09-18 NOTE — Telephone Encounter (Signed)
Per Dr. Madilyn Fireman. Rx for Wellbutrin XL 150 mg was sent to [facility]. Rhonda Cunningham,CMA

## 2015-09-30 ENCOUNTER — Ambulatory Visit (INDEPENDENT_AMBULATORY_CARE_PROVIDER_SITE_OTHER): Payer: Federal, State, Local not specified - PPO

## 2015-09-30 DIAGNOSIS — M79661 Pain in right lower leg: Secondary | ICD-10-CM | POA: Diagnosis not present

## 2015-09-30 DIAGNOSIS — M898X6 Other specified disorders of bone, lower leg: Secondary | ICD-10-CM

## 2015-10-06 ENCOUNTER — Encounter: Payer: Self-pay | Admitting: Family Medicine

## 2015-10-06 ENCOUNTER — Ambulatory Visit (INDEPENDENT_AMBULATORY_CARE_PROVIDER_SITE_OTHER): Payer: Federal, State, Local not specified - PPO | Admitting: Family Medicine

## 2015-10-06 ENCOUNTER — Ambulatory Visit (INDEPENDENT_AMBULATORY_CARE_PROVIDER_SITE_OTHER): Payer: Federal, State, Local not specified - PPO

## 2015-10-06 VITALS — BP 135/79 | HR 81 | Wt 153.0 lb

## 2015-10-06 VITALS — BP 135/79 | HR 81

## 2015-10-06 DIAGNOSIS — M5441 Lumbago with sciatica, right side: Secondary | ICD-10-CM

## 2015-10-06 DIAGNOSIS — M25551 Pain in right hip: Secondary | ICD-10-CM

## 2015-10-06 DIAGNOSIS — R002 Palpitations: Secondary | ICD-10-CM | POA: Diagnosis not present

## 2015-10-06 DIAGNOSIS — M5440 Lumbago with sciatica, unspecified side: Secondary | ICD-10-CM | POA: Insufficient documentation

## 2015-10-06 MED ORDER — PREDNISONE 10 MG (21) PO TBPK: ORAL_TABLET | ORAL | Status: DC

## 2015-10-06 NOTE — Patient Instructions (Signed)
Thank you for coming in today. Return in 3-4 weeks.  Attend PT.  Get xray today.  Take prednisone.   Sciatica Sciatica is pain, weakness, numbness, or tingling along the path of the sciatic nerve. The nerve starts in the lower back and runs down the back of each leg. The nerve controls the muscles in the lower leg and in the back of the knee, while also providing sensation to the back of the thigh, lower leg, and the sole of your foot. Sciatica is a symptom of another medical condition. For instance, nerve damage or certain conditions, such as a herniated disk or bone spur on the spine, pinch or put pressure on the sciatic nerve. This causes the pain, weakness, or other sensations normally associated with sciatica. Generally, sciatica only affects one side of the body. CAUSES   Herniated or slipped disc.  Degenerative disk disease.  A pain disorder involving the narrow muscle in the buttocks (piriformis syndrome).  Pelvic injury or fracture.  Pregnancy.  Tumor (rare). SYMPTOMS  Symptoms can vary from mild to very severe. The symptoms usually travel from the low back to the buttocks and down the back of the leg. Symptoms can include:  Mild tingling or dull aches in the lower back, leg, or hip.  Numbness in the back of the calf or sole of the foot.  Burning sensations in the lower back, leg, or hip.  Sharp pains in the lower back, leg, or hip.  Leg weakness.  Severe back pain inhibiting movement. These symptoms may get worse with coughing, sneezing, laughing, or prolonged sitting or standing. Also, being overweight may worsen symptoms. DIAGNOSIS  Your caregiver will perform a physical exam to look for common symptoms of sciatica. He or she may ask you to do certain movements or activities that would trigger sciatic nerve pain. Other tests may be performed to find the cause of the sciatica. These may include:  Blood tests.  X-rays.  Imaging tests, such as an MRI or CT  scan. TREATMENT  Treatment is directed at the cause of the sciatic pain. Sometimes, treatment is not necessary and the pain and discomfort goes away on its own. If treatment is needed, your caregiver may suggest:  Over-the-counter medicines to relieve pain.  Prescription medicines, such as anti-inflammatory medicine, muscle relaxants, or narcotics.  Applying heat or ice to the painful area.  Steroid injections to lessen pain, irritation, and inflammation around the nerve.  Reducing activity during periods of pain.  Exercising and stretching to strengthen your abdomen and improve flexibility of your spine. Your caregiver may suggest losing weight if the extra weight makes the back pain worse.  Physical therapy.  Surgery to eliminate what is pressing or pinching the nerve, such as a bone spur or part of a herniated disk. HOME CARE INSTRUCTIONS   Only take over-the-counter or prescription medicines for pain or discomfort as directed by your caregiver.  Apply ice to the affected area for 20 minutes, 3-4 times a day for the first 48-72 hours. Then try heat in the same way.  Exercise, stretch, or perform your usual activities if these do not aggravate your pain.  Attend physical therapy sessions as directed by your caregiver.  Keep all follow-up appointments as directed by your caregiver.  Do not wear high heels or shoes that do not provide proper support.  Check your mattress to see if it is too soft. A firm mattress may lessen your pain and discomfort. SEEK IMMEDIATE MEDICAL CARE IF:  You lose control of your bowel or bladder (incontinence).  You have increasing weakness in the lower back, pelvis, buttocks, or legs.  You have redness or swelling of your back.  You have a burning sensation when you urinate.  You have pain that gets worse when you lie down or awakens you at night.  Your pain is worse than you have experienced in the past.  Your pain is lasting longer than 4  weeks.  You are suddenly losing weight without reason. MAKE SURE YOU:  Understand these instructions.  Will watch your condition.  Will get help right away if you are not doing well or get worse.   This information is not intended to replace advice given to you by your health care provider. Make sure you discuss any questions you have with your health care provider.   Document Released: 07/05/2001 Document Revised: 04/01/2015 Document Reviewed: 11/20/2011 Elsevier Interactive Patient Education [employer].

## 2015-10-06 NOTE — Progress Notes (Signed)
   Subjective:    I'm seeing this patient as a consultation for:  Dr Madilyn Fireman  CC: Right Leg Pain  HPI: Patient has a 3 to four-month history of right leg pain. Pain radiates from the right lateral hip and into the lateral knee and anterior shin. Pain is worse with activity and better with rest. No weakness or numbness. No bowel bladder dysfunction. Patient has a history of complex regional pain syndrome involving her feet. This is controlled with a implanted spinal stimulator. Her current leg pain is not consistent with previous complex regional pain syndrome pain. Additionally she denies any injury to explain her pain. She denies any fevers or chills.  Past medical history, Surgical history, Family history not pertinant except as noted below, Social history, Allergies, and medications have been entered into the medical record, reviewed, and no changes needed.   Review of Systems: No headache, visual changes, nausea, vomiting, diarrhea, constipation, dizziness, abdominal pain, skin rash, fevers, chills, night sweats, weight loss, swollen lymph nodes, body aches, joint swelling, muscle aches, chest pain, shortness of breath, mood changes, visual or auditory hallucinations.   Objective:    Filed Vitals:   10/06/15 1022  BP: 135/79  Pulse: 81   General: Well Developed, well nourished, and in no acute distress.  Neuro/Psych: Alert and oriented x3, extra-ocular muscles intact, able to move all 4 extremities, sensation grossly intact. Skin: Warm and dry, no rashes noted.  Respiratory: Not using accessory muscles, speaking in full sentences, trachea midline.  Cardiovascular: Pulses palpable, no extremity edema. Abdomen: Does not appear distended. MSK: Back is nontender to spinal midline. Normal back motion. Right leg positive slump test negative left. Reflexes are equal and normal bilateral knees and ankles. Lower sore strength is intact throughout. Sensation is intact throughout. Hip is  mildly tender overlying the greater trochanter but has normal hip motion. Knee is normal appearing and nontender stable ligamentous exam and negative McMurray's testing.   No results found for this or any previous visit (from the past 24 hour(s)). Dg Lumbar Spine Complete  10/06/2015  CLINICAL DATA:  Right lower back pain radiating down right leg for 2 months. No known injury. Pain stimulator for 2 years EXAM: LUMBAR SPINE - COMPLETE 4+ VIEW COMPARISON:  None. FINDINGS: A stimulator device is in place with the tip at the T11 level. Normal alignment. No fracture. Disc spaces are maintained. SI joints are symmetric and unremarkable. IMPRESSION: No acute bony abnormality. Electronically Signed   By: Rolm Baptise M.D.   On: 10/06/2015 11:23    Impression and Recommendations:   This case required medical decision making of moderate complexity.

## 2015-10-06 NOTE — Assessment & Plan Note (Signed)
The patient's pain is difficult to understand fully. I suspect this is more likely to be due to L4 nerve root radiculopathy than anything else. Plan for prednisone Dosepak as well as physical therapy. Obtain x-ray of the L-spine. Recheck in a few weeks.
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
## 2015-10-06 NOTE — Assessment & Plan Note (Signed)
Greater trochanteric bursitis. Plan for physical therapy.

## 2015-10-06 NOTE — Progress Notes (Signed)
Quick Note:  Back xray looks ok ______

## 2015-10-28 ENCOUNTER — Ambulatory Visit (HOSPITAL_BASED_OUTPATIENT_CLINIC_OR_DEPARTMENT_OTHER)
Admission: RE | Admit: 2015-10-28 | Discharge: 2015-10-28 | Disposition: A | Discharge status: Home / Self Care | Payer: Federal, State, Local not specified - PPO | Source: Ambulatory Visit | Attending: Family Medicine | Admitting: Family Medicine

## 2015-10-28 DIAGNOSIS — I071 Rheumatic tricuspid insufficiency: Secondary | ICD-10-CM | POA: Diagnosis not present

## 2015-10-28 DIAGNOSIS — I517 Cardiomegaly: Secondary | ICD-10-CM | POA: Diagnosis not present

## 2015-10-28 DIAGNOSIS — Z720 Tobacco use: Secondary | ICD-10-CM | POA: Insufficient documentation

## 2015-10-28 DIAGNOSIS — R002 Palpitations: Secondary | ICD-10-CM | POA: Diagnosis not present

## 2015-10-28 DIAGNOSIS — E785 Hyperlipidemia, unspecified: Secondary | ICD-10-CM | POA: Insufficient documentation

## 2015-10-28 NOTE — Progress Notes (Signed)
  Echocardiogram 2D Echocardiogram has been performed.  Bobbye Charleston 10/28/2015, 10:11 AM

## 2015-10-29 ENCOUNTER — Encounter: Payer: Self-pay | Admitting: Physical Therapy

## 2015-10-29 ENCOUNTER — Ambulatory Visit (INDEPENDENT_AMBULATORY_CARE_PROVIDER_SITE_OTHER): Payer: Federal, State, Local not specified - PPO | Admitting: Physical Therapy

## 2015-10-29 ENCOUNTER — Ambulatory Visit (INDEPENDENT_AMBULATORY_CARE_PROVIDER_SITE_OTHER): Payer: Federal, State, Local not specified - PPO | Admitting: Family Medicine

## 2015-10-29 ENCOUNTER — Encounter: Payer: Self-pay | Admitting: Family Medicine

## 2015-10-29 VITALS — BP 129/69 | HR 75 | Wt 154.0 lb

## 2015-10-29 DIAGNOSIS — M6281 Muscle weakness (generalized): Secondary | ICD-10-CM | POA: Diagnosis not present

## 2015-10-29 DIAGNOSIS — R5383 Other fatigue: Secondary | ICD-10-CM

## 2015-10-29 DIAGNOSIS — M79661 Pain in right lower leg: Secondary | ICD-10-CM

## 2015-10-29 DIAGNOSIS — R002 Palpitations: Secondary | ICD-10-CM | POA: Diagnosis not present

## 2015-10-29 DIAGNOSIS — M2550 Pain in unspecified joint: Secondary | ICD-10-CM

## 2015-10-29 DIAGNOSIS — M545 Low back pain, unspecified: Secondary | ICD-10-CM

## 2015-10-29 DIAGNOSIS — K121 Other forms of stomatitis: Secondary | ICD-10-CM | POA: Diagnosis not present

## 2015-10-29 DIAGNOSIS — G4700 Insomnia, unspecified: Secondary | ICD-10-CM

## 2015-10-29 DIAGNOSIS — M5441 Lumbago with sciatica, right side: Secondary | ICD-10-CM

## 2015-10-29 MED ORDER — CARISOPRODOL 350 MG PO TABS: 350.0000 mg | ORAL_TABLET | Freq: Two times a day (BID) | ORAL | Status: DC | PRN

## 2015-10-29 NOTE — Progress Notes (Signed)
   Subjective:    Patient ID: [name], female    DOB: 04/01/1971, 45 y.o.   MRN: [id number]  HPI  She comes in today concerned about her echocardiogram results. Originally ordered the test because she had complained of frequent palpitations . She also had a family history of a sister who has a mitral valve prolapse. She was worried that this could be causing some of her symptoms.  She also has some general concerns about Behcet's disease. She has had oral ulcers on and off since she was a young girl. And she's been on prednisone on and off ever since then. She did go see dermatology about 5 or 6 years ago at [facility]. They recommended a change in medication at the time she felt like it wasn't effective and did not return. She has not had any history of rectal ulcers. She has had other symptoms which she finds concerning and she thinks could be related to the Behcet's including mental fogginess, extreme fatigue. Joint aches. Premature arthritis, frequent headaches. And more recently she's been diagnosed with early vitreous detachment. She is being followed by optometry for this. She has a follow-up next week.She's also had some issues with dental decay and her teeth breaking.   Review of Systems     Objective:   Physical Exam  Constitutional: She is oriented to person, place, and time. She appears well-developed and well-nourished.  HENT:  Head: Normocephalic and atraumatic.  Eyes: Conjunctivae and EOM are normal.  Cardiovascular: Normal rate.   Pulmonary/Chest: Effort normal.  Neurological: She is alert and oriented to person, place, and time.  Skin: Skin is dry. No pallor.  Psychiatric: She has a normal mood and affect. Her behavior is normal.  Vitals reviewed.         Assessment & Plan:  Palpitations-we did discuss options including a low-dose beta blocker. She's worried about her pulse dropping too much. Most the time her pulse is in the 70s to 80s here so I think she  would probably do fine but she wants to hold off on any medication at this point.  Echocardiogram results-I gave her a copy of the report we went over the results together. She did have some mild left ventricular hypertrophy we discussed keep an eye on her blood pressure. If it starts to go up over time he could certainly exacerbate that. She also had just a little bit of leakage on the tricuspid valve. Explained that this is of very frequent finding and is not worrisome and should not be causing any symptoms at this point in time. Next  Behcet's disease-I do feel like she most likely has Behcet's. With her history of recurrent mouth ulcers and recent issues with the vitreous of her eyes. She's never had any genital ulcers that she reports. She has not had pathergy testing done. I would like to get her referred to someone he maybe specializes this before more formal diagnosis and review of treatment options which can sometimes include chemotherapy type drugs and DMARD's. I do really would like to get her off of chronic rounds of steroids which she has been using on and off for the last 20 years.
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
## 2015-10-29 NOTE — Patient Instructions (Signed)
Trigger Point Dry Needling  . What is Trigger Point Dry Needling (DN)? o DN is a physical therapy technique used to treat muscle pain and dysfunction. Specifically, DN helps deactivate muscle trigger points (muscle knots).  o A thin filiform needle is used to penetrate the skin and stimulate the underlying trigger point. The goal is for a local twitch response (LTR) to occur and for the trigger point to relax. No medication of any kind is injected during the procedure.   . What Does Trigger Point Dry Needling Feel Like?  o The procedure feels different for each individual patient. Some patients report that they do not actually feel the needle enter the skin and overall the process is not painful. Very mild bleeding may occur. However, many patients feel a deep cramping in the muscle in which the needle was inserted. This is the local twitch response.   Marland Kitchen How Will I feel after the treatment? o Soreness is normal, and the onset of soreness may not occur for a few hours. Typically this soreness does not last longer than two days.  o Bruising is uncommon, however; ice can be used to decrease any possible bruising.  o In rare cases feeling tired or nauseous after the treatment is normal. In addition, your symptoms may get worse before they get better, this period will typically not last longer than 24 hours.   . What Can I do After My Treatment? o Increase your hydration by drinking more water for the next 24 hours. o You may place ice or heat on the areas treated that have become sore, however, do not use heat on inflamed or bruised areas. Heat often brings more relief post needling. o You can continue your regular activities, but vigorous activity is not recommended initially after the treatment for 24 hours. o DN is best combined with other physical therapy such as strengthening, stretching, and other therapies.   Lumbar Rotation (Non-Weight Bearing)    Feet on floor, slowly rock knees from  side to side in small, pain-free range of motion. Allow lower back to rotate slightly. Repeat __10__ times per set. Do __1__ sets per session. Do __1__ sessions per day.  Knee-to-Chest Stretch: Bilateral    With hands behind knees, pull both knees in to chest until a comfortable stretch is felt in lower back and buttocks. Keep back relaxed. Hold __30__ seconds. Repeat _1-2___ times per set. Do _1___ sets per session. Do __1__ sessions per day.   PROM: Ankle Plantar / Dorsiflexion    Gently grasp right foot and bend foot and ankle down. Hold each position __20-30__ seconds. Repeat __1-2__ times per set. Do _1___ sets per session. Do _1___ sessions per day. Have someone else move foot.  Copyright  VHI. All rights reserved.

## 2015-11-04 ENCOUNTER — Encounter: Payer: Self-pay | Admitting: Physical Therapy

## 2015-11-04 ENCOUNTER — Ambulatory Visit (INDEPENDENT_AMBULATORY_CARE_PROVIDER_SITE_OTHER): Payer: Federal, State, Local not specified - PPO | Admitting: Physical Therapy

## 2015-11-04 DIAGNOSIS — M6281 Muscle weakness (generalized): Secondary | ICD-10-CM

## 2015-11-04 DIAGNOSIS — M545 Low back pain, unspecified: Secondary | ICD-10-CM

## 2015-11-04 DIAGNOSIS — M79661 Pain in right lower leg: Secondary | ICD-10-CM

## 2015-11-04 NOTE — Patient Instructions (Addendum)
  Abdominal Bracing With Pelvic Floor (Hook-Lying)   With neutral spine, tighten pelvic floor and abdominals. Hold 5-10 seconds. Repeat __10_ times. Do _1__ times a day  Hip External Rotation With Pillow: Transverse Plane Stability   Both knees bent, slowly roll knees out. Be sure pelvis does not rotate. Do _10__ times. Restabilize pelvis. Do _1-2__ sets, _1__ times per day.  [facility] [address] [place], [place] [address] [phone] (office) [phone] (fax)  Outer Hip Stretch: Reclined IT Band Stretch (Strap)    Strap around opposite foot, pull across only as far as possible with shoulders on mat. Hold for _30-45___ sec. Repeat __2__ times each leg.
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
## 2015-11-08 ENCOUNTER — Encounter: Payer: Self-pay | Admitting: Family Medicine

## 2015-11-09 ENCOUNTER — Encounter: Payer: Federal, State, Local not specified - PPO | Admitting: Physical Therapy

## 2015-11-09 ENCOUNTER — Other Ambulatory Visit: Payer: Self-pay | Admitting: Family Medicine

## 2015-11-09 MED ORDER — FUROSEMIDE 20 MG PO TABS: 20.0000 mg | ORAL_TABLET | Freq: Every day | ORAL | Status: DC | PRN

## 2015-11-11 ENCOUNTER — Ambulatory Visit (INDEPENDENT_AMBULATORY_CARE_PROVIDER_SITE_OTHER): Payer: Federal, State, Local not specified - PPO | Admitting: Physical Therapy

## 2015-11-11 DIAGNOSIS — M79661 Pain in right lower leg: Secondary | ICD-10-CM

## 2015-11-11 DIAGNOSIS — M6281 Muscle weakness (generalized): Secondary | ICD-10-CM

## 2015-11-11 DIAGNOSIS — M545 Low back pain, unspecified: Secondary | ICD-10-CM
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
## 2015-11-13 ENCOUNTER — Encounter: Payer: Self-pay | Admitting: Rehabilitative and Restorative Service Providers"

## 2015-11-13 ENCOUNTER — Ambulatory Visit (INDEPENDENT_AMBULATORY_CARE_PROVIDER_SITE_OTHER): Payer: Federal, State, Local not specified - PPO | Admitting: Rehabilitative and Restorative Service Providers"

## 2015-11-13 DIAGNOSIS — M545 Low back pain, unspecified: Secondary | ICD-10-CM

## 2015-11-13 DIAGNOSIS — M6281 Muscle weakness (generalized): Secondary | ICD-10-CM | POA: Diagnosis not present

## 2015-11-13 DIAGNOSIS — M79661 Pain in right lower leg: Secondary | ICD-10-CM

## 2015-11-13 NOTE — Patient Instructions (Signed)
Abdominal Bracing With Pelvic Floor (Hook-Lying)    With neutral spine, tighten pelvic floor and abdominals sucking belly button to back bone; tighten muscles in back at waist. Hold 10 sec  Repeat _10__ times. Do __several_ times a day. Progress to sitting; standing; walking and functional activities    Quads / HF, Supine    Lie near edge of bed, one leg bent, foot flat on bed. Other leg hanging over edge, relaxed, thigh resting entirely on bed. Bend hanging knee backward keeping thigh in contact with bed. Hold _30-60 __ seconds.  Repeat _3__ times per session. Do _3-4__ sessions per day.   Use ball in the front at Rt lower belly - for ball release work
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
## 2015-11-23 ENCOUNTER — Encounter: Payer: Federal, State, Local not specified - PPO | Admitting: Physical Therapy

## 2015-11-24 DIAGNOSIS — M797 Fibromyalgia: Secondary | ICD-10-CM | POA: Diagnosis not present

## 2015-12-07 ENCOUNTER — Telehealth: Payer: Self-pay | Admitting: Family Medicine

## 2015-12-07 MED ORDER — ALPRAZOLAM 2 MG PO TABS: 2.0000 mg | ORAL_TABLET | Freq: Every evening | ORAL | Status: DC | PRN

## 2015-12-07 NOTE — Telephone Encounter (Signed)
Called adn the xanax 1mg  - 2 tabs at bedtime not working as well. Has been taking more prednisone for mouth ulcers and it is keeping her awake.  She has already tried [place], [place] and [place] without success.  Would ike to try 3mg  of xanax.  i warned her about potential for dependency. New rx sent to pharmacy.

## 2015-12-28 ENCOUNTER — Other Ambulatory Visit: Payer: Self-pay | Admitting: Family Medicine

## 2016-01-04 DIAGNOSIS — R1084 Generalized abdominal pain: Secondary | ICD-10-CM | POA: Diagnosis not present

## 2016-01-04 DIAGNOSIS — M1990 Unspecified osteoarthritis, unspecified site: Secondary | ICD-10-CM | POA: Diagnosis not present

## 2016-01-04 DIAGNOSIS — K581 Irritable bowel syndrome with constipation: Secondary | ICD-10-CM | POA: Diagnosis not present

## 2016-01-07 DIAGNOSIS — K5901 Slow transit constipation: Secondary | ICD-10-CM | POA: Diagnosis not present

## 2016-01-07 DIAGNOSIS — K5900 Constipation, unspecified: Secondary | ICD-10-CM | POA: Diagnosis not present

## 2016-01-07 DIAGNOSIS — R1084 Generalized abdominal pain: Secondary | ICD-10-CM | POA: Diagnosis not present

## 2016-01-07 DIAGNOSIS — R140 Abdominal distension (gaseous): Secondary | ICD-10-CM | POA: Diagnosis not present

## 2016-01-07 DIAGNOSIS — K589 Irritable bowel syndrome without diarrhea: Secondary | ICD-10-CM | POA: Diagnosis not present

## 2016-01-07 DIAGNOSIS — Z780 Asymptomatic menopausal state: Secondary | ICD-10-CM | POA: Diagnosis not present

## 2016-01-07 LAB — HEPATIC FUNCTION PANEL
ALK PHOS: 108 U/L (ref 25–125)
ALT: 16 U/L (ref 7–35)
AST: 18 U/L (ref 13–35)
BILIRUBIN, TOTAL: 0.3 mg/dL

## 2016-01-07 LAB — CBC AND DIFFERENTIAL
HCT: 42 % (ref 36–46)
Hemoglobin: 13.9 g/dL (ref 12.0–16.0)
Neutrophils Absolute: 4 /uL
Platelets: 326 10*3/uL (ref 150–399)
WBC: 6.4 10^3/mL

## 2016-01-07 LAB — TSH: TSH: 1.14 u[IU]/mL (ref 0.41–5.90)

## 2016-01-07 LAB — BASIC METABOLIC PANEL
BUN: 9 mg/dL (ref 4–21)
Creatinine: 0.7 mg/dL (ref 0.5–1.1)
Glucose: 71 mg/dL
Potassium: 4.9 mmol/L (ref 3.4–5.3)
SODIUM: 144 mmol/L (ref 137–147)

## 2016-01-07 LAB — THYROID PANEL WITH TSH
T3 Uptake: 32
Thyroxine (T4): 5.4

## 2016-01-20 ENCOUNTER — Encounter: Payer: Self-pay | Admitting: Family Medicine

## 2016-01-22 DIAGNOSIS — K581 Irritable bowel syndrome with constipation: Secondary | ICD-10-CM | POA: Diagnosis not present

## 2016-02-17 DIAGNOSIS — R195 Other fecal abnormalities: Secondary | ICD-10-CM | POA: Diagnosis not present

## 2016-02-17 DIAGNOSIS — K648 Other hemorrhoids: Secondary | ICD-10-CM | POA: Diagnosis not present

## 2016-02-17 LAB — HM COLONOSCOPY

## 2016-02-25 ENCOUNTER — Encounter: Payer: Self-pay | Admitting: Family Medicine

## 2016-02-29 ENCOUNTER — Other Ambulatory Visit: Payer: Self-pay

## 2016-02-29 MED ORDER — ALPRAZOLAM 2 MG PO TABS: 2.0000 mg | ORAL_TABLET | Freq: Every evening | ORAL | 0 refills | Status: DC | PRN

## 2016-02-29 NOTE — Telephone Encounter (Signed)
Patient advised to schedule a follow up appointment before more refills.  

## 2016-03-15 ENCOUNTER — Ambulatory Visit (INDEPENDENT_AMBULATORY_CARE_PROVIDER_SITE_OTHER): Payer: Federal, State, Local not specified - PPO | Admitting: Family Medicine

## 2016-03-15 ENCOUNTER — Encounter: Payer: Self-pay | Admitting: Family Medicine

## 2016-03-15 VITALS — BP 119/72 | HR 70 | Ht 65.5 in | Wt 154.0 lb

## 2016-03-15 DIAGNOSIS — I6529 Occlusion and stenosis of unspecified carotid artery: Secondary | ICD-10-CM | POA: Diagnosis not present

## 2016-03-15 DIAGNOSIS — K2100 Gastro-esophageal reflux disease with esophagitis, without bleeding: Secondary | ICD-10-CM

## 2016-03-15 DIAGNOSIS — K120 Recurrent oral aphthae: Secondary | ICD-10-CM | POA: Diagnosis not present

## 2016-03-15 DIAGNOSIS — F341 Dysthymic disorder: Secondary | ICD-10-CM

## 2016-03-15 DIAGNOSIS — K121 Other forms of stomatitis: Secondary | ICD-10-CM

## 2016-03-15 DIAGNOSIS — K5901 Slow transit constipation: Secondary | ICD-10-CM

## 2016-03-15 MED ORDER — CIPROFLOXACIN-DEXAMETHASONE 0.3-0.1 % OT SUSP: 4.0000 [drp] | Freq: Two times a day (BID) | OTIC | 0 refills | Status: DC

## 2016-03-15 MED ORDER — OMEPRAZOLE 40 MG PO CPDR: 40.0000 mg | DELAYED_RELEASE_CAPSULE | ORAL | 3 refills | Status: DC
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
## 2016-03-17 ENCOUNTER — Telehealth: Payer: Self-pay | Admitting: Family Medicine

## 2016-03-17 NOTE — Telephone Encounter (Signed)
  After some research, I think she would do well on Fetzima. It is similar to effexor but can give more energy without the weight gain.  See what she thinks about that. It is branded so we may have to see if we have any coupons or online.

## 2016-03-17 NOTE — Telephone Encounter (Signed)
Called and informed pt of recommendations. She is ok with switching to new medication. Will need to know how to titrate off of the Effexor. Will route to pcp.[name]

## 2016-03-20 MED ORDER — LEVOMILNACIPRAN HCL ER 20 & 40 MG PO C4PK: EXTENDED_RELEASE_CAPSULE | ORAL | 0 refills | Status: DC

## 2016-03-20 MED ORDER — VENLAFAXINE HCL ER 37.5 MG PO CP24: ORAL_CAPSULE | ORAL | 0 refills | Status: DC

## 2016-03-20 NOTE — Telephone Encounter (Signed)
I sent over taper for effexor and new rx for Fetzima.  Let's make sure the insurancae will cover the [facility] before changing starting effexor taper.

## 2016-03-23 DIAGNOSIS — K5900 Constipation, unspecified: Secondary | ICD-10-CM | POA: Diagnosis not present

## 2016-03-29 ENCOUNTER — Other Ambulatory Visit: Payer: Self-pay

## 2016-03-29 MED ORDER — ALPRAZOLAM 2 MG PO TABS: 2.0000 mg | ORAL_TABLET | Freq: Every evening | ORAL | 1 refills | Status: DC | PRN

## 2016-03-31 ENCOUNTER — Telehealth: Payer: Self-pay | Admitting: Family Medicine

## 2016-03-31 NOTE — Telephone Encounter (Signed)
[facility] [phone]), spoke with Evelena Peat. Received PA on Imitrex. Valid: 01/31/16-09/27/16. Pharmacy notified.

## 2016-04-11 ENCOUNTER — Telehealth: Payer: Self-pay

## 2016-04-11 MED ORDER — FLUCONAZOLE 150 MG PO TABS: 150.0000 mg | ORAL_TABLET | Freq: Once | ORAL | 1 refills | Status: AC

## 2016-04-11 NOTE — Telephone Encounter (Signed)
Left VM for Pt advising of new Rx.

## 2016-04-11 NOTE — Telephone Encounter (Signed)
Pt reports that she recently had oral surgery and was put on 3 different antibiotics.  She now has a yeast infection and is asking for some diflucan. Please advise.

## 2016-04-11 NOTE — Telephone Encounter (Signed)
Rx sent for diflucan

## 2016-04-26 ENCOUNTER — Encounter: Payer: Self-pay | Admitting: Sports Medicine

## 2016-04-26 ENCOUNTER — Ambulatory Visit (INDEPENDENT_AMBULATORY_CARE_PROVIDER_SITE_OTHER): Payer: Federal, State, Local not specified - PPO | Admitting: Sports Medicine

## 2016-04-26 ENCOUNTER — Other Ambulatory Visit: Payer: Self-pay | Admitting: Family Medicine

## 2016-04-26 DIAGNOSIS — M792 Neuralgia and neuritis, unspecified: Secondary | ICD-10-CM

## 2016-04-26 DIAGNOSIS — M2550 Pain in unspecified joint: Secondary | ICD-10-CM | POA: Diagnosis not present

## 2016-04-26 DIAGNOSIS — W57XXXA Bitten or stung by nonvenomous insect and other nonvenomous arthropods, initial encounter: Secondary | ICD-10-CM | POA: Diagnosis not present

## 2016-04-26 MED ORDER — AMBULATORY NON FORMULARY MEDICATION
11 refills | Status: DC
Start: 2016-04-26 — End: 2017-09-20

## 2016-04-26 NOTE — Progress Notes (Signed)
Patient reports take back to half months ago. Ever since then she's had more frequent headaches and joint pain. She never experienced a rash. She has been having some night sweats but no fevers per se. She would like to be evaluated for Lyme's disease.

## 2016-04-26 NOTE — Progress Notes (Signed)

## 2016-04-26 NOTE — Assessment & Plan Note (Signed)
Custom orthotics as above. Prescription for therapeutic massage.

## 2016-04-27 LAB — CYCLIC CITRUL PEPTIDE ANTIBODY, IGG: Cyclic Citrullin Peptide Ab: <16 Units

## 2016-04-27 LAB — SEDIMENTATION RATE: SED RATE: 1 mm/h (ref 0–20)

## 2016-04-27 LAB — LYME AB/WESTERN BLOT REFLEX: B burgdorferi Ab IgG+IgM: <0.9 Index (ref <0.90)

## 2016-04-27 LAB — C-REACTIVE PROTEIN: CRP: 0.9 mg/L (ref <8.0)

## 2016-05-06 ENCOUNTER — Other Ambulatory Visit: Payer: Self-pay | Admitting: Family Medicine

## 2016-05-06 MED ORDER — LEVOMILNACIPRAN HCL ER 80 MG PO CP24
80.0000 mg | ORAL_CAPSULE | Freq: Every day | ORAL | 1 refills | Status: DC
Start: 2016-05-06 — End: 2016-06-06

## 2016-05-06 NOTE — Telephone Encounter (Signed)
Patient called. Not feeling the Terie Purser is working well. Will finish starter pack this weekend.  Will send new Rx for 80mg .

## 2016-05-09 ENCOUNTER — Other Ambulatory Visit: Payer: Self-pay | Admitting: Family Medicine

## 2016-05-09 DIAGNOSIS — M25551 Pain in right hip: Secondary | ICD-10-CM

## 2016-05-09 DIAGNOSIS — M5441 Lumbago with sciatica, right side: Secondary | ICD-10-CM

## 2016-05-10 ENCOUNTER — Other Ambulatory Visit: Payer: Self-pay

## 2016-05-10 DIAGNOSIS — M542 Cervicalgia: Principal | ICD-10-CM

## 2016-05-10 DIAGNOSIS — G8929 Other chronic pain: Secondary | ICD-10-CM

## 2016-05-10 MED ORDER — PREDNISONE 10 MG PO TABS: 50.0000 mg | ORAL_TABLET | Freq: Every day | ORAL | 1 refills | Status: DC

## 2016-05-17 DIAGNOSIS — N9489 Other specified conditions associated with female genital organs and menstrual cycle: Secondary | ICD-10-CM | POA: Diagnosis not present

## 2016-05-17 DIAGNOSIS — Z5181 Encounter for therapeutic drug level monitoring: Secondary | ICD-10-CM | POA: Diagnosis not present

## 2016-05-17 DIAGNOSIS — K120 Recurrent oral aphthae: Secondary | ICD-10-CM | POA: Diagnosis not present

## 2016-05-23 ENCOUNTER — Telehealth: Payer: Self-pay | Admitting: Family Medicine

## 2016-05-23 NOTE — Telephone Encounter (Signed)
I have atorvastatin on her list instead of simvastain so I just want to clarify.  Did she have intolerance to both?  If so then we can try crestor.

## 2016-05-23 NOTE — Telephone Encounter (Signed)
Pt called clinic, states she feels the Simvastatin is what's causing her joint and muscle pain. Pt would like a different Rx to try.  Pt also requesting refill on her Xanax. This is about 5 days early. Will route to PCP for review.

## 2016-05-24 MED ORDER — ALPRAZOLAM 2 MG PO TABS: 2.0000 mg | ORAL_TABLET | Freq: Every evening | ORAL | 1 refills | Status: DC | PRN

## 2016-05-24 MED ORDER — ROSUVASTATIN CALCIUM 10 MG PO TABS: 10.0000 mg | ORAL_TABLET | Freq: Every day | ORAL | 3 refills | Status: DC

## 2016-05-24 NOTE — Telephone Encounter (Signed)
rx sent

## 2016-05-24 NOTE — Telephone Encounter (Signed)
Left VM for Pt to see if she would be OK with starting Crestor and verify what Rx she is currently taking. Callback information provided.

## 2016-05-24 NOTE — Telephone Encounter (Signed)
Pt is OK to try Crestor as long as it comes in generic. Will route.

## 2016-05-24 NOTE — Addendum Note (Signed)
Addended by: Beatrice Lecher D on: 05/24/2016 05:01 PM   Modules accepted: Orders

## 2016-05-31 DIAGNOSIS — M7989 Other specified soft tissue disorders: Secondary | ICD-10-CM | POA: Diagnosis not present

## 2016-05-31 DIAGNOSIS — Z7982 Long term (current) use of aspirin: Secondary | ICD-10-CM | POA: Diagnosis not present

## 2016-05-31 DIAGNOSIS — Z885 Allergy status to narcotic agent status: Secondary | ICD-10-CM | POA: Diagnosis not present

## 2016-05-31 DIAGNOSIS — M79605 Pain in left leg: Secondary | ICD-10-CM | POA: Diagnosis not present

## 2016-05-31 DIAGNOSIS — Z880 Allergy status to penicillin: Secondary | ICD-10-CM | POA: Diagnosis not present

## 2016-05-31 DIAGNOSIS — F17200 Nicotine dependence, unspecified, uncomplicated: Secondary | ICD-10-CM | POA: Diagnosis not present

## 2016-05-31 DIAGNOSIS — M25862 Other specified joint disorders, left knee: Secondary | ICD-10-CM | POA: Diagnosis not present

## 2016-05-31 DIAGNOSIS — Z888 Allergy status to other drugs, medicaments and biological substances status: Secondary | ICD-10-CM | POA: Diagnosis not present

## 2016-05-31 DIAGNOSIS — Z79899 Other long term (current) drug therapy: Secondary | ICD-10-CM | POA: Diagnosis not present

## 2016-05-31 DIAGNOSIS — Z85820 Personal history of malignant melanoma of skin: Secondary | ICD-10-CM | POA: Diagnosis not present

## 2016-05-31 DIAGNOSIS — M7122 Synovial cyst of popliteal space [Baker], left knee: Secondary | ICD-10-CM | POA: Diagnosis not present

## 2016-05-31 DIAGNOSIS — Z86718 Personal history of other venous thrombosis and embolism: Secondary | ICD-10-CM | POA: Diagnosis not present

## 2016-06-06 ENCOUNTER — Encounter: Payer: Self-pay | Admitting: Family Medicine

## 2016-06-06 ENCOUNTER — Ambulatory Visit (INDEPENDENT_AMBULATORY_CARE_PROVIDER_SITE_OTHER): Payer: Federal, State, Local not specified - PPO | Admitting: Family Medicine

## 2016-06-06 VITALS — BP 117/78 | HR 94 | Ht 65.5 in | Wt 152.0 lb

## 2016-06-06 DIAGNOSIS — E7849 Other hyperlipidemia: Secondary | ICD-10-CM

## 2016-06-06 DIAGNOSIS — E784 Other hyperlipidemia: Secondary | ICD-10-CM | POA: Diagnosis not present

## 2016-06-06 DIAGNOSIS — Z1231 Encounter for screening mammogram for malignant neoplasm of breast: Secondary | ICD-10-CM | POA: Diagnosis not present

## 2016-06-06 DIAGNOSIS — M8589 Other specified disorders of bone density and structure, multiple sites: Secondary | ICD-10-CM | POA: Diagnosis not present

## 2016-06-06 DIAGNOSIS — M1712 Unilateral primary osteoarthritis, left knee: Secondary | ICD-10-CM

## 2016-06-06 DIAGNOSIS — M170 Bilateral primary osteoarthritis of knee: Secondary | ICD-10-CM | POA: Insufficient documentation

## 2016-06-06 MED ORDER — LEVOMILNACIPRAN HCL ER 80 MG PO CP24
80.0000 mg | ORAL_CAPSULE | Freq: Every day | ORAL | 5 refills | Status: DC
Start: 2016-06-06 — End: 2017-01-03

## 2016-06-06 MED ORDER — OXYCODONE-ACETAMINOPHEN 5-325 MG PO TABS: 1.0000 | ORAL_TABLET | ORAL | 0 refills | Status: DC | PRN

## 2016-06-06 NOTE — Assessment & Plan Note (Signed)
Small Baker cyst that is asymptomatic, more pain at the medial joint line and patellar facets classic with osteoarthritis. Injection as above, return to see me in one month. She did have some radicular type pain that we can further workup at the follow-up visit.

## 2016-06-06 NOTE — Progress Notes (Signed)
   Subjective:    I'm seeing this patient as a consultation for:  Dr. Beatrice Lecher  CC: Left knee pain  HPI: This is a pleasant 45 year old female, she was seen in the emergency department, ultrasound showed what appeared to be a Baker's cyst. She is referred to me for further evaluation and definitive treatment. Pain is localized mostly at the medial joint line, she does have a history of knee osteoarthritis post multiple arthroscopies. Worse going up and down stairs with pain at the front.  Past medical history:  Negative.  See flowsheet/record as well for more information.  Surgical history: Negative.  See flowsheet/record as well for more information.  Family history: Negative.  See flowsheet/record as well for more information.  Social history: Negative.  See flowsheet/record as well for more information.  Allergies, and medications have been entered into the medical record, reviewed, and no changes needed.   Review of Systems: No headache, visual changes, nausea, vomiting, diarrhea, constipation, dizziness, abdominal pain, skin rash, fevers, chills, night sweats, weight loss, swollen lymph nodes, body aches, joint swelling, muscle aches, chest pain, shortness of breath, mood changes, visual or auditory hallucinations.   Objective:   General: Well Developed, well nourished, and in no acute distress.  Neuro/Psych: Alert and oriented x3, extra-ocular muscles intact, able to move all 4 extremities, sensation grossly intact. Skin: Warm and dry, no rashes noted.  Respiratory: Not using accessory muscles, speaking in full sentences, trachea midline.  Cardiovascular: Pulses palpable, no extremity edema. Abdomen: Does not appear distended. Left Knee: Normal to inspection with no erythema or effusion or obvious bony abnormalities. Tender to palpation at the medial joint line and under the patellar facets, really no tenderness at the gastrocnemius/semitendinosus crux ROM normal in  flexion and extension and lower leg rotation. Ligaments with solid consistent endpoints including ACL, PCL, LCL, MCL. Negative Mcmurray's and provocative meniscal tests. Non painful patellar compression. Patellar and quadriceps tendons unremarkable. Hamstring and quadriceps strength is normal.  Procedure: Real-time Ultrasound Guided Injection of left knee Device: GE Logiq E  Verbal informed consent obtained.  Time-out conducted.  Noted no overlying erythema, induration, or other signs of local infection.  Skin prepped in a sterile fashion.  Local anesthesia: Topical Ethyl chloride.  With sterile technique and under real time ultrasound guidance:  Noted very small Baker cyst, there is no tenderness over the cyst itself, patient was flipped over and 25-gauge needle advanced into the super patellar recess and 1 mL kenalog 40, 1 mL lidocaine injected easily. Completed without difficulty  Pain immediately resolved suggesting accurate placement of the medication.  Advised to call if fevers/chills, erythema, induration, drainage, or persistent bleeding.  Images permanently stored and available for review in the ultrasound unit.  Impression: Technically successful ultrasound guided injection.  Impression and Recommendations:   This case required medical decision making of moderate complexity.  Primary osteoarthritis of left knee Small Baker cyst that is asymptomatic, more pain at the medial joint line and patellar facets classic with osteoarthritis. Injection as above, return to see me in one month. She did have some radicular type pain that we can further workup at the follow-up visit.

## 2016-06-06 NOTE — Progress Notes (Signed)
Subjective:    CC:   HPI:  Left knee pain. She still having a lot of palms. She recently went to the emergency department and [facility]. They diagnosed her with a Baker cyst that they felt was probably causing some of her pain and problem. That she's had a lot of pain anteriorly as well as posteriorly. In fact she is having some pain radiating down her leg into her foot at times. She was given oxycodone a few tabs for acute relief and it did seem to help.  She also recently had her stimulator we adjusted and that has helped her pain.  Hyperlipidemia-she's willing to try the Crestor but says she never got the prescription. Per our records it was sent on October 31 but she has not received it yet.  Depression-she's doing really well on the Fetzima 80 mg. She feels like it's really been effective in controlling her symptoms and does need a refill sent today.  She still continues to have pain particularly over the joints like the elbows wrists knees and ankles. That she has not had any swelling or redness. At times she just feels extremely weak and has a hard time even getting up off the floor at times.  Past medical history, Surgical history, Family history not pertinant except as noted below, Social history, Allergies, and medications have been entered into the medical record, reviewed, and corrections made.   Review of Systems: No fevers, chills, night sweats, weight loss, chest pain, or shortness of breath.   Objective:    General: Well Developed, well nourished, and in no acute distress.  Neuro: Alert and oriented x3, extra-ocular muscles intact, sensation grossly intact.  HEENT: Normocephalic, atraumatic  Skin: Warm and dry, no rashes. Cardiac: Regular rate and rhythm, no murmurs rubs or gallops, no lower extremity edema.  Respiratory: Clear to auscultation bilaterally. Not using accessory muscles, speaking in full sentences.   Impression and Recommendations:   Left knee pain-given  perception for oxycodone today. Use sparingly. I did have on my partners see her today and do any injection to see if that would help. She also has an appointment 2 weeks with orthopedist.  Chronic prednisone use-she did get back to see the dermatologist. He said that she did not meet enough criteria for but she notes but it was consistent with chronic apoptosis. He had recommended colchicine and dapsone. Both of which she has tried in the past. That she's been continue with her prednisone. We'll just have to make sure that that she is monitored for osteoporosis. Her last bone density test was in 2009. She's due for repeat.  For screening mammogram as well will placed that order. She prefers to have 3 done at the breast center in [place].  Polyarthralgia-she wonders if she could have fibromyalgia but explained most the time of several rounded it's more muscle pain in between the joints versus the joint pain typically. No sign of other autoimmune disease.

## 2016-06-15 ENCOUNTER — Ambulatory Visit: Payer: Federal, State, Local not specified - PPO | Admitting: Family Medicine

## 2016-07-04 ENCOUNTER — Ambulatory Visit (INDEPENDENT_AMBULATORY_CARE_PROVIDER_SITE_OTHER): Payer: Federal, State, Local not specified - PPO | Admitting: Sports Medicine

## 2016-07-04 ENCOUNTER — Encounter: Payer: Self-pay | Admitting: Sports Medicine

## 2016-07-04 ENCOUNTER — Telehealth: Payer: Self-pay | Admitting: Sports Medicine

## 2016-07-04 DIAGNOSIS — M170 Bilateral primary osteoarthritis of knee: Secondary | ICD-10-CM

## 2016-07-04 DIAGNOSIS — M5441 Lumbago with sciatica, right side: Secondary | ICD-10-CM

## 2016-07-04 MED ORDER — OXYCODONE-ACETAMINOPHEN 5-325 MG PO TABS: 1.0000 | ORAL_TABLET | ORAL | 0 refills | Status: DC | PRN

## 2016-07-04 NOTE — Telephone Encounter (Signed)
Submitted for approval on Orthovisc. Awaiting confirmation.  

## 2016-07-04 NOTE — Assessment & Plan Note (Signed)
Only a week or 2 response from the left knee injection. I am going to get her set up for viscosupplementation. Return to start Orthovisc injections when approved

## 2016-07-04 NOTE — Progress Notes (Signed)
  Subjective:    CC: Follow-up  HPI: Knee osteoarthritis: I injected symptoms left knee and month ago, she had only a week or 2 of relief. Now has a recurrence of pain, both sides, medial joint line without mechanical symptoms. X-rays did confirm mild osteoarthritis.  Radicular pain: Has seen Dr. Georgina Snell for this, unfortunately has persistent symptoms radiating down the left leg in what was seemingly an L4 distribution. She had x-rays that were overall unrevealing, has had greater than 6 weeks of physician directed conservative measures. We cannot obtain an MRI, she does have a spinal cord stimulator. She is agreeable to proceed with CT myelogram.  Past medical history:  Negative.  See flowsheet/record as well for more information.  Surgical history: Negative.  See flowsheet/record as well for more information.  Family history: Negative.  See flowsheet/record as well for more information.  Social history: Negative.  See flowsheet/record as well for more information.  Allergies, and medications have been entered into the medical record, reviewed, and no changes needed.   Review of Systems: No fevers, chills, night sweats, weight loss, chest pain, or shortness of breath.   Objective:    General: Well Developed, well nourished, and in no acute distress.  Neuro: Alert and oriented x3, extra-ocular muscles intact, sensation grossly intact.  HEENT: Normocephalic, atraumatic, pupils equal round reactive to light, neck supple, no masses, no lymphadenopathy, thyroid nonpalpable.  Skin: Warm and dry, no rashes. Cardiac: Regular rate and rhythm, no murmurs rubs or gallops, no lower extremity edema.  Respiratory: Clear to auscultation bilaterally. Not using accessory muscles, speaking in full sentences. Bilateral knees: Normal to inspection with no erythema or effusion or obvious bony abnormalities. Tender to palpation at the medial joint line on both sides ROM normal in flexion and extension and lower  leg rotation. Ligaments with solid consistent endpoints including ACL, PCL, LCL, MCL. Negative Mcmurray's and provocative meniscal tests. Non painful patellar compression. Patellar and quadriceps tendons unremarkable. Hamstring and quadriceps strength is normal.  Impression and Recommendations:    Primary osteoarthritis of both knees Only a week or 2 response from the left knee injection. I am going to get her set up for viscosupplementation. Return to start Orthovisc injections when approved  Lumbago with sciatica Left-sided lumbar radiculopathy. At this point we are going to proceed with CT myelogram, no MRI can be performed due to spinal cord stimulator. Single refill of pain medication.

## 2016-07-04 NOTE — Telephone Encounter (Signed)
-----   Message from Silverio Decamp, MD sent at 07/04/2016  1:48 PM EST ----- Bilateral x-ray confirmed arthritis, failed steroid injection, Orthovisc bilateral approval. ___________________________________________ Gwen Her. Dianah Field, M.D., ABFM., CAQSM. Primary Care and [place] Instructor of [place] [facility] of Medicine

## 2016-07-04 NOTE — Assessment & Plan Note (Signed)
Left-sided lumbar radiculopathy. At this point we are going to proceed with CT myelogram, no MRI can be performed due to spinal cord stimulator. Single refill of pain medication.

## 2016-07-05 ENCOUNTER — Other Ambulatory Visit: Payer: Self-pay | Admitting: Sports Medicine

## 2016-07-05 MED ORDER — SODIUM HYALURONATE (VISCOSUP) 25 MG/2.5ML IX SOSY: PREFILLED_SYRINGE | INTRA_ARTICULAR | 0 refills | Status: DC

## 2016-07-06 NOTE — Telephone Encounter (Signed)
Received the following information from [employer] benefits investigation:  Patient has Basic Option PPO plan with an effective date of 11/19/2011. A letter of medical necessity to be faxed to [phone] for advances benefits determination. Attn. Advanced Benefits Determination. We have attached a letter of medical necessity form to the case details page for your convenience. Please refer to medical policy guidelines. [id number] is covered at 70% & [id number] is covered at 100% of the contracted rate when performed in an office setting. $40.00 copay for specialist office visit applies.If out of pocket is met, coverage goes to 100% and copay will be waived. Reference: [id number] (EXT: 1025)   Rx for Supartz sent to [facility], Rx has been recieved. Pt notified.

## 2016-07-08 ENCOUNTER — Other Ambulatory Visit: Payer: Self-pay

## 2016-07-08 MED ORDER — SODIUM HYALURONATE (VISCOSUP) 25 MG/2.5ML IX SOSY: PREFILLED_SYRINGE | INTRA_ARTICULAR | 0 refills | Status: DC

## 2016-07-08 NOTE — Telephone Encounter (Signed)
Received information from [facility] regarding Van. Injections have been approved from 05/07/16-07/06/17. Request a new signed Rx be faxed to service benefit plan speciality pharmacy at F: [phone]. Pharmacy phone: [phone].   Pt advised.

## 2016-07-20 ENCOUNTER — Other Ambulatory Visit: Payer: Self-pay

## 2016-07-20 MED ORDER — ALPRAZOLAM 2 MG PO TABS: 2.0000 mg | ORAL_TABLET | Freq: Every evening | ORAL | 1 refills | Status: DC | PRN

## 2016-07-20 NOTE — Telephone Encounter (Signed)
Received Supartz in the mail. Pt notified. Appts made.

## 2016-07-27 ENCOUNTER — Telehealth: Payer: Self-pay | Admitting: Sports Medicine

## 2016-07-27 DIAGNOSIS — M545 Low back pain, unspecified: Secondary | ICD-10-CM
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
## 2016-07-28 NOTE — Addendum Note (Signed)
Addended by: Silverio Decamp on: 07/28/2016 03:13 PM   Modules accepted: Orders

## 2016-07-28 NOTE — Telephone Encounter (Signed)
Dr. Darene Lamer she said x-ray of lower back and coxes (I have no idea if spelled that right or what that is lol) She is gonna come at 1pm for x-ray tomorrow 07/29/16. Thanks

## 2016-07-28 NOTE — Telephone Encounter (Signed)
Lumbar spine as well as sacrococcygeal x-rays ordered.

## 2016-07-28 NOTE — Telephone Encounter (Signed)
Left leg?  Right leg?  Hip, ankle, knee?

## 2016-07-29 ENCOUNTER — Ambulatory Visit (INDEPENDENT_AMBULATORY_CARE_PROVIDER_SITE_OTHER): Payer: Federal, State, Local not specified - PPO | Admitting: Sports Medicine

## 2016-07-29 ENCOUNTER — Encounter: Payer: Self-pay | Admitting: Sports Medicine

## 2016-07-29 ENCOUNTER — Ambulatory Visit (INDEPENDENT_AMBULATORY_CARE_PROVIDER_SITE_OTHER): Payer: Federal, State, Local not specified - PPO

## 2016-07-29 DIAGNOSIS — S3992XA Unspecified injury of lower back, initial encounter: Secondary | ICD-10-CM | POA: Diagnosis not present

## 2016-07-29 DIAGNOSIS — M129 Arthropathy, unspecified: Secondary | ICD-10-CM

## 2016-07-29 DIAGNOSIS — M170 Bilateral primary osteoarthritis of knee: Secondary | ICD-10-CM | POA: Diagnosis not present

## 2016-07-29 DIAGNOSIS — M5137 Other intervertebral disc degeneration, lumbosacral region: Secondary | ICD-10-CM | POA: Diagnosis not present

## 2016-07-29 DIAGNOSIS — M533 Sacrococcygeal disorders, not elsewhere classified: Secondary | ICD-10-CM | POA: Diagnosis not present

## 2016-07-29 DIAGNOSIS — M545 Low back pain, unspecified: Secondary | ICD-10-CM

## 2016-07-29 NOTE — Assessment & Plan Note (Signed)
Supartz injection #1 of 5 into both knees, return in one week for #2 of 5.

## 2016-07-29 NOTE — Progress Notes (Signed)
  Procedure: Real-time Ultrasound Guided Injection of left knee Device: GE Logiq E  Verbal informed consent obtained.  Time-out conducted.  Noted no overlying erythema, induration, or other signs of local infection.  Skin prepped in a sterile fashion.  Local anesthesia: Topical Ethyl chloride.  With sterile technique and under real time ultrasound guidance:  25 mg/2.5 mL of Supartz (sodium hyaluronate) in a prefilled syringe was injected easily into the knee through a 22-gauge needle. Completed without difficulty  Pain immediately resolved suggesting accurate placement of the medication.  Advised to call if fevers/chills, erythema, induration, drainage, or persistent bleeding.  Images permanently stored and available for review in the ultrasound unit.  Impression: Technically successful ultrasound guided injection.  Procedure: Real-time Ultrasound Guided Injection of right knee Device: GE Logiq E  Verbal informed consent obtained.  Time-out conducted.  Noted no overlying erythema, induration, or other signs of local infection.  Skin prepped in a sterile fashion.  Local anesthesia: Topical Ethyl chloride.  With sterile technique and under real time ultrasound guidance:  25 mg/2.5 mL of Supartz (sodium hyaluronate) in a prefilled syringe was injected easily into the knee through a 22-gauge needle. Completed without difficulty  Pain immediately resolved suggesting accurate placement of the medication.  Advised to call if fevers/chills, erythema, induration, drainage, or persistent bleeding.  Images permanently stored and available for review in the ultrasound unit.  Impression: Technically successful ultrasound guided injection. 

## 2016-08-05 ENCOUNTER — Ambulatory Visit (INDEPENDENT_AMBULATORY_CARE_PROVIDER_SITE_OTHER): Payer: Federal, State, Local not specified - PPO | Admitting: Sports Medicine

## 2016-08-05 DIAGNOSIS — M170 Bilateral primary osteoarthritis of knee: Secondary | ICD-10-CM | POA: Diagnosis not present

## 2016-08-05 NOTE — Assessment & Plan Note (Signed)
Supartz injection #2 of 5 into both knees, return in one week for #3.

## 2016-08-05 NOTE — Progress Notes (Signed)
  Procedure: Real-time Ultrasound Guided Injection of left knee Device: GE Logiq E  Verbal informed consent obtained.  Time-out conducted.  Noted no overlying erythema, induration, or other signs of local infection.  Skin prepped in a sterile fashion.  Local anesthesia: Topical Ethyl chloride.  With sterile technique and under real time ultrasound guidance:  25 mg/2.5 mL of Supartz (sodium hyaluronate) in a prefilled syringe was injected easily into the knee through a 22-gauge needle. Completed without difficulty  Pain immediately resolved suggesting accurate placement of the medication.  Advised to call if fevers/chills, erythema, induration, drainage, or persistent bleeding.  Images permanently stored and available for review in the ultrasound unit.  Impression: Technically successful ultrasound guided injection.  Procedure: Real-time Ultrasound Guided Injection of right knee Device: GE Logiq E  Verbal informed consent obtained.  Time-out conducted.  Noted no overlying erythema, induration, or other signs of local infection.  Skin prepped in a sterile fashion.  Local anesthesia: Topical Ethyl chloride.  With sterile technique and under real time ultrasound guidance:  25 mg/2.5 mL of Supartz (sodium hyaluronate) in a prefilled syringe was injected easily into the knee through a 22-gauge needle. Completed without difficulty  Pain immediately resolved suggesting accurate placement of the medication.  Advised to call if fevers/chills, erythema, induration, drainage, or persistent bleeding.  Images permanently stored and available for review in the ultrasound unit.  Impression: Technically successful ultrasound guided injection. 

## 2016-08-15 ENCOUNTER — Ambulatory Visit (INDEPENDENT_AMBULATORY_CARE_PROVIDER_SITE_OTHER): Payer: Federal, State, Local not specified - PPO | Admitting: Sports Medicine

## 2016-08-15 ENCOUNTER — Encounter: Payer: Self-pay | Admitting: Sports Medicine

## 2016-08-15 DIAGNOSIS — M170 Bilateral primary osteoarthritis of knee: Secondary | ICD-10-CM | POA: Diagnosis not present

## 2016-08-15 NOTE — Assessment & Plan Note (Signed)
Supartz injection #3 of 5 into both knees, return in one week for #4. Starting to feel improvement.

## 2016-08-15 NOTE — Progress Notes (Signed)
  Procedure: Real-time Ultrasound Guided Injection of left knee Device: GE Logiq E  Verbal informed consent obtained.  Time-out conducted.  Noted no overlying erythema, induration, or other signs of local infection.  Skin prepped in a sterile fashion.  Local anesthesia: Topical Ethyl chloride.  With sterile technique and under real time ultrasound guidance:  25 mg/2.5 mL of Supartz (sodium hyaluronate) in a prefilled syringe was injected easily into the knee through a 22-gauge needle. Completed without difficulty  Pain immediately resolved suggesting accurate placement of the medication.  Advised to call if fevers/chills, erythema, induration, drainage, or persistent bleeding.  Images permanently stored and available for review in the ultrasound unit.  Impression: Technically successful ultrasound guided injection.  Procedure: Real-time Ultrasound Guided Injection of right knee Device: GE Logiq E  Verbal informed consent obtained.  Time-out conducted.  Noted no overlying erythema, induration, or other signs of local infection.  Skin prepped in a sterile fashion.  Local anesthesia: Topical Ethyl chloride.  With sterile technique and under real time ultrasound guidance:  25 mg/2.5 mL of Supartz (sodium hyaluronate) in a prefilled syringe was injected easily into the knee through a 22-gauge needle. Completed without difficulty  Pain immediately resolved suggesting accurate placement of the medication.  Advised to call if fevers/chills, erythema, induration, drainage, or persistent bleeding.  Images permanently stored and available for review in the ultrasound unit.  Impression: Technically successful ultrasound guided injection. 

## 2016-08-17 ENCOUNTER — Telehealth: Payer: Self-pay | Admitting: *Deleted

## 2016-08-17 MED ORDER — PROMETHAZINE-DM 6.25-15 MG/5ML PO SYRP: 5.0000 mL | ORAL_SOLUTION | Freq: Every evening | ORAL | 0 refills | Status: DC | PRN

## 2016-08-17 NOTE — Telephone Encounter (Signed)
Pt called and lvm asking if Dr. Madilyn Fireman would send a cough medicine in to [facility] for a cough medicine. She reports that she has had a cough, HA, and backache.   I called pt back and she stated that she had been around someone who had the flu last wk. She has no fever, sweats,chills. Cough is non productive, + body aches, sore throat (? Result of coughing). Taking mucinex, and IBU.Audelia Hives Lyles

## 2016-08-17 NOTE — Telephone Encounter (Signed)
Let her know rx sent to pharmacy. If runs fever call us back.  Beatrice Lecher, MD

## 2016-08-18 NOTE — Telephone Encounter (Signed)
Pt advised.

## 2016-08-22 ENCOUNTER — Ambulatory Visit (INDEPENDENT_AMBULATORY_CARE_PROVIDER_SITE_OTHER): Payer: Federal, State, Local not specified - PPO | Admitting: Sports Medicine

## 2016-08-22 DIAGNOSIS — R69 Illness, unspecified: Secondary | ICD-10-CM | POA: Diagnosis not present

## 2016-08-22 DIAGNOSIS — M170 Bilateral primary osteoarthritis of knee: Secondary | ICD-10-CM | POA: Diagnosis not present

## 2016-08-22 DIAGNOSIS — J111 Influenza due to unidentified influenza virus with other respiratory manifestations: Secondary | ICD-10-CM | POA: Insufficient documentation

## 2016-08-22 MED ORDER — OSELTAMIVIR PHOSPHATE 75 MG PO CAPS: 75.0000 mg | ORAL_CAPSULE | Freq: Two times a day (BID) | ORAL | 0 refills | Status: DC

## 2016-08-22 MED ORDER — PROMETHAZINE HCL 25 MG PO TABS: 25.0000 mg | ORAL_TABLET | Freq: Four times a day (QID) | ORAL | 3 refills | Status: DC | PRN

## 2016-08-22 MED ORDER — DEXTROMETHORPHAN POLISTIREX ER 30 MG/5ML PO SUER: 60.0000 mg | Freq: Two times a day (BID) | ORAL | 0 refills | Status: DC

## 2016-08-22 NOTE — Progress Notes (Signed)
  Subjective:    CC: Feeling sick and knee injections  HPI: Feeling sick: Exposure to a contact with influenza, now with muscle aches, body aches, fevers and chills. Sore throat, cough, minimally productive. Principal complaint now is cough. Phenergan with dextromethorphan is not sufficiently controlling her cough, and she is unable to take narcotics.  Bilateral knee arthritis: Nearly pain-free, here for Supartz injection #4 of 5 into both knees.  Past medical history:  Negative.  See flowsheet/record as well for more information.  Surgical history: Negative.  See flowsheet/record as well for more information.  Family history: Negative.  See flowsheet/record as well for more information.  Social history: Negative.  See flowsheet/record as well for more information.  Allergies, and medications have been entered into the medical record, reviewed, and no changes needed.   Review of Systems: No fevers, chills, night sweats, weight loss, chest pain, or shortness of breath.   Objective:    General: Well Developed, well nourished, and in no acute distress.  Neuro: Alert and oriented x3, extra-ocular muscles intact, sensation grossly intact.  HEENT: Normocephalic, atraumatic, pupils equal round reactive to light, neck supple, no masses, no lymphadenopathy, thyroid nonpalpable.  Skin: Warm and dry, no rashes. Cardiac: Regular rate and rhythm, no murmurs rubs or gallops, no lower extremity edema.  Respiratory: Clear to auscultation bilaterally. Not using accessory muscles, speaking in full sentences.  Procedure: Real-time Ultrasound Guided Injection of left knee Device: GE Logiq E  Verbal informed consent obtained.  Time-out conducted.  Noted no overlying erythema, induration, or other signs of local infection.  Skin prepped in a sterile fashion.  Local anesthesia: Topical Ethyl chloride.  With sterile technique and under real time ultrasound guidance:  25 mg/2.5 mL of Supartz (sodium  hyaluronate) in a prefilled syringe was injected easily into the knee through a 22-gauge needle.  Completed without difficulty  Pain immediately resolved suggesting accurate placement of the medication.  Advised to call if fevers/chills, erythema, induration, drainage, or persistent bleeding.  Images permanently stored and available for review in the ultrasound unit.  Impression: Technically successful ultrasound guided injection.  Procedure: Real-time Ultrasound Guided Injection of right knee Device: GE Logiq E  Verbal informed consent obtained.  Time-out conducted.  Noted no overlying erythema, induration, or other signs of local infection.  Skin prepped in a sterile fashion.  Local anesthesia: Topical Ethyl chloride.  With sterile technique and under real time ultrasound guidance:   25 mg/2.5 mL of Supartz (sodium hyaluronate) in a prefilled syringe was injected easily into the knee through a 22-gauge needle. Completed without difficulty  Pain immediately resolved suggesting accurate placement of the medication.  Advised to call if fevers/chills, erythema, induration, drainage, or persistent bleeding.  Images permanently stored and available for review in the ultrasound unit.  Impression: Technically successful ultrasound guided injection.  Impression and Recommendations:    Primary osteoarthritis of both knees Supartz injection #4 of 5 into both knees, return in one week for #5.  Influenza-like illness Adding Tamiflu. Phenergan. She will use over-the-counter Delsym. Unable to use codeine or hydrocodone-containing products.

## 2016-08-22 NOTE — Assessment & Plan Note (Signed)
Adding Tamiflu. Phenergan. She will use over-the-counter Delsym. Unable to use codeine or hydrocodone-containing products.

## 2016-08-22 NOTE — Assessment & Plan Note (Signed)
Supartz injection #4 of 5 into both knees, return in one week for #5.

## 2016-08-29 ENCOUNTER — Ambulatory Visit (INDEPENDENT_AMBULATORY_CARE_PROVIDER_SITE_OTHER): Payer: Federal, State, Local not specified - PPO

## 2016-08-29 ENCOUNTER — Ambulatory Visit (INDEPENDENT_AMBULATORY_CARE_PROVIDER_SITE_OTHER): Payer: Federal, State, Local not specified - PPO | Admitting: Sports Medicine

## 2016-08-29 ENCOUNTER — Encounter: Payer: Self-pay | Admitting: Sports Medicine

## 2016-08-29 DIAGNOSIS — R05 Cough: Secondary | ICD-10-CM | POA: Diagnosis not present

## 2016-08-29 DIAGNOSIS — R69 Illness, unspecified: Secondary | ICD-10-CM

## 2016-08-29 DIAGNOSIS — F17200 Nicotine dependence, unspecified, uncomplicated: Secondary | ICD-10-CM | POA: Diagnosis not present

## 2016-08-29 DIAGNOSIS — R0602 Shortness of breath: Secondary | ICD-10-CM | POA: Diagnosis not present

## 2016-08-29 DIAGNOSIS — J111 Influenza due to unidentified influenza virus with other respiratory manifestations: Secondary | ICD-10-CM

## 2016-08-29 DIAGNOSIS — M170 Bilateral primary osteoarthritis of knee: Secondary | ICD-10-CM

## 2016-08-29 MED ORDER — PREDNISONE 50 MG PO TABS: 50.0000 mg | ORAL_TABLET | Freq: Every day | ORAL | 0 refills | Status: DC

## 2016-08-29 MED ORDER — AZITHROMYCIN 250 MG PO TABS: ORAL_TABLET | ORAL | 0 refills | Status: DC

## 2016-08-29 NOTE — Progress Notes (Addendum)
  Subjective:    CC: Supartz injection and still sick  HPI: Osteoarthritis: Here for Supartz injection #5 of 5, doing extremely well.  Cough: Continuously sick, moderate smoker, persistent cough with mild shortness of breath.  Past medical history:  Negative.  See flowsheet/record as well for more information.  Surgical history: Negative.  See flowsheet/record as well for more information.  Family history: Negative.  See flowsheet/record as well for more information.  Social history: Negative.  See flowsheet/record as well for more information.  Allergies, and medications have been entered into the medical record, reviewed, and no changes needed.   Review of Systems: No fevers, chills, night sweats, weight loss, chest pain, or shortness of breath.   Objective:    General: Well Developed, well nourished, and in no acute distress.  Neuro: Alert and oriented x3, extra-ocular muscles intact, sensation grossly intact.  HEENT: Normocephalic, atraumatic, pupils equal round reactive to light, neck supple, no masses, no lymphadenopathy, thyroid nonpalpable.  Skin: Warm and dry, no rashes. Cardiac: Regular rate and rhythm, no murmurs rubs or gallops, no lower extremity edema.  Respiratory: Clear to auscultation bilaterally. Not using accessory muscles, speaking in full sentences.  Procedure: Real-time Ultrasound Guided Injection of left knee Device: GE Logiq E  Verbal informed consent obtained.  Time-out conducted.  Noted no overlying erythema, induration, or other signs of local infection.  Skin prepped in a sterile fashion.  Local anesthesia: Topical Ethyl chloride.  With sterile technique and under real time ultrasound guidance:  25 mg/2.5 mL of Supartz (sodium hyaluronate) in a prefilled syringe was injected easily into the knee through a 22-gauge needle.  Completed without difficulty  Pain immediately resolved suggesting accurate placement of the medication.  Advised to call if  fevers/chills, erythema, induration, drainage, or persistent bleeding.  Images permanently stored and available for review in the ultrasound unit.  Impression: Technically successful ultrasound guided injection.  Procedure: Real-time Ultrasound Guided Injection of right knee Device: GE Logiq E  Verbal informed consent obtained.  Time-out conducted.  Noted no overlying erythema, induration, or other signs of local infection.  Skin prepped in a sterile fashion.  Local anesthesia: Topical Ethyl chloride.  With sterile technique and under real time ultrasound guidance:   25 mg/2.5 mL of Supartz (sodium hyaluronate) in a prefilled syringe was injected easily into the knee through a 22-gauge needle. Completed without difficulty  Pain immediately resolved suggesting accurate placement of the medication.  Advised to call if fevers/chills, erythema, induration, drainage, or persistent bleeding.  Images permanently stored and available for review in the ultrasound unit.  Impression: Technically successful ultrasound guided injection.  Impression and Recommendations:    Primary osteoarthritis of both knees Supartz injection #5 of 5 into both knees. Return as needed.  Influenza-like illness Persistent cough, adding azithromycin, prednisone, she will finish her Tamiflu. Chest x-ray. When all symptoms are resolved I think we do need to do a pre-and postbronchodilator spirometry considering smoking history.

## 2016-08-29 NOTE — Assessment & Plan Note (Signed)
Supartz injection #5 of 5 into both knees. Return as needed.

## 2016-08-29 NOTE — Assessment & Plan Note (Signed)
Persistent cough, adding azithromycin, prednisone, she will finish her Tamiflu. Chest x-ray. When all symptoms are resolved I think we do need to do a pre-and postbronchodilator spirometry considering smoking history.

## 2016-08-29 NOTE — Addendum Note (Signed)
Addended by: Silverio Decamp on: 08/29/2016 01:40 PM   Modules accepted: Orders

## 2016-09-12 DIAGNOSIS — K589 Irritable bowel syndrome without diarrhea: Secondary | ICD-10-CM | POA: Diagnosis not present

## 2016-09-12 DIAGNOSIS — K581 Irritable bowel syndrome with constipation: Secondary | ICD-10-CM | POA: Diagnosis not present

## 2016-09-12 DIAGNOSIS — K5900 Constipation, unspecified: Secondary | ICD-10-CM | POA: Diagnosis not present

## 2016-09-12 DIAGNOSIS — R140 Abdominal distension (gaseous): Secondary | ICD-10-CM | POA: Diagnosis not present

## 2016-09-12 DIAGNOSIS — R1084 Generalized abdominal pain: Secondary | ICD-10-CM | POA: Diagnosis not present

## 2016-09-14 ENCOUNTER — Encounter: Payer: Self-pay | Admitting: Family Medicine

## 2016-09-14 MED ORDER — ALPRAZOLAM 2 MG PO TABS: 2.0000 mg | ORAL_TABLET | Freq: Every evening | ORAL | 1 refills | Status: DC | PRN

## 2016-09-15 ENCOUNTER — Other Ambulatory Visit: Payer: Self-pay | Admitting: Family Medicine

## 2016-09-19 DIAGNOSIS — E875 Hyperkalemia: Secondary | ICD-10-CM | POA: Diagnosis not present

## 2016-11-09 ENCOUNTER — Encounter: Payer: Self-pay | Admitting: Family Medicine

## 2016-11-10 MED ORDER — ALPRAZOLAM 2 MG PO TABS: 2.0000 mg | ORAL_TABLET | Freq: Every evening | ORAL | 0 refills | Status: DC | PRN

## 2016-11-14 ENCOUNTER — Encounter: Payer: Self-pay | Admitting: Family Medicine

## 2016-11-14 ENCOUNTER — Ambulatory Visit (INDEPENDENT_AMBULATORY_CARE_PROVIDER_SITE_OTHER): Payer: Federal, State, Local not specified - PPO | Admitting: Family Medicine

## 2016-11-14 VITALS — BP 110/69 | HR 87 | Ht 66.0 in | Wt 156.0 lb

## 2016-11-14 DIAGNOSIS — Z85828 Personal history of other malignant neoplasm of skin: Secondary | ICD-10-CM | POA: Insufficient documentation

## 2016-11-14 DIAGNOSIS — Z720 Tobacco use: Secondary | ICD-10-CM

## 2016-11-14 DIAGNOSIS — K5901 Slow transit constipation: Secondary | ICD-10-CM | POA: Diagnosis not present

## 2016-11-14 DIAGNOSIS — F5101 Primary insomnia: Secondary | ICD-10-CM | POA: Diagnosis not present

## 2016-11-14 DIAGNOSIS — F341 Dysthymic disorder: Secondary | ICD-10-CM | POA: Diagnosis not present

## 2016-11-14 DIAGNOSIS — L989 Disorder of the skin and subcutaneous tissue, unspecified: Secondary | ICD-10-CM
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
## 2016-11-29 ENCOUNTER — Ambulatory Visit (INDEPENDENT_AMBULATORY_CARE_PROVIDER_SITE_OTHER): Payer: Federal, State, Local not specified - PPO | Admitting: Family Medicine

## 2016-11-29 ENCOUNTER — Ambulatory Visit
Admission: RE | Admit: 2016-11-29 | Discharge: 2016-11-29 | Disposition: A | Discharge status: Home / Self Care | Payer: Federal, State, Local not specified - PPO | Source: Ambulatory Visit | Attending: Family Medicine | Admitting: Family Medicine

## 2016-11-29 ENCOUNTER — Encounter: Payer: Self-pay | Admitting: Family Medicine

## 2016-11-29 VITALS — BP 115/72 | HR 84 | Wt 156.0 lb

## 2016-11-29 DIAGNOSIS — Z1231 Encounter for screening mammogram for malignant neoplasm of breast: Secondary | ICD-10-CM | POA: Diagnosis not present

## 2016-11-29 DIAGNOSIS — M722 Plantar fascial fibromatosis: Secondary | ICD-10-CM

## 2016-11-29 DIAGNOSIS — M8589 Other specified disorders of bone density and structure, multiple sites: Secondary | ICD-10-CM | POA: Diagnosis not present

## 2016-11-29 DIAGNOSIS — Z780 Asymptomatic menopausal state: Secondary | ICD-10-CM | POA: Diagnosis not present
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
## 2016-11-29 NOTE — Patient Instructions (Addendum)
Try using crutches for the next 2 weeks just to try to completely rest the foot if at all possible.  Avoid walking barefoot or in slippers. After 2 weeks okay to start the exercises.

## 2016-12-05 DIAGNOSIS — L814 Other melanin hyperpigmentation: Secondary | ICD-10-CM | POA: Diagnosis not present

## 2016-12-05 DIAGNOSIS — D492 Neoplasm of unspecified behavior of bone, soft tissue, and skin: Secondary | ICD-10-CM | POA: Diagnosis not present

## 2016-12-05 DIAGNOSIS — D2372 Other benign neoplasm of skin of left lower limb, including hip: Secondary | ICD-10-CM | POA: Diagnosis not present

## 2016-12-05 DIAGNOSIS — L820 Inflamed seborrheic keratosis: Secondary | ICD-10-CM | POA: Diagnosis not present

## 2016-12-05 DIAGNOSIS — D2361 Other benign neoplasm of skin of right upper limb, including shoulder: Secondary | ICD-10-CM | POA: Diagnosis not present

## 2016-12-12 ENCOUNTER — Other Ambulatory Visit: Payer: Self-pay | Admitting: *Deleted

## 2016-12-12 ENCOUNTER — Encounter: Payer: Self-pay | Admitting: Family Medicine

## 2016-12-12 MED ORDER — ALPRAZOLAM 2 MG PO TABS: 2.0000 mg | ORAL_TABLET | Freq: Every evening | ORAL | 0 refills | Status: DC | PRN

## 2016-12-26 ENCOUNTER — Telehealth: Payer: Self-pay | Admitting: *Deleted

## 2016-12-26 NOTE — Telephone Encounter (Signed)
Pt called and lvm stating that she was advised to call back with an update about her foot. She states that this is the 2nd VM she has left.[name]

## 2016-12-26 NOTE — Telephone Encounter (Signed)
Called pt back and lvm asking that she return call and that she can lvm with what's going on or speak w/someone in triage.[name]'

## 2016-12-27 NOTE — Telephone Encounter (Signed)
Kim called back and states her left foot pain has not improved. She would like to proceed to the next step. Please advise.

## 2016-12-28 NOTE — Telephone Encounter (Signed)
Patient advised and scheduled.  

## 2016-12-28 NOTE — Telephone Encounter (Signed)
OK, I would recommend she see sports med for possible eval for, or we also have Podiatry downstairs if you want to go that route.

## 2016-12-29 ENCOUNTER — Ambulatory Visit (INDEPENDENT_AMBULATORY_CARE_PROVIDER_SITE_OTHER): Payer: Federal, State, Local not specified - PPO | Admitting: Sports Medicine

## 2016-12-29 DIAGNOSIS — M722 Plantar fascial fibromatosis: Secondary | ICD-10-CM

## 2016-12-29 NOTE — Assessment & Plan Note (Signed)
Has done all the conservative measures including nighttime splinting, CAM boot, rehabilitation exercises. At this point she is a candidate for interventional treatment, plantar fascia injection, adding the air heel brace.  Return in one month.

## 2016-12-29 NOTE — Progress Notes (Signed)
   Subjective:    I'm seeing this patient as a consultation for:  Dr. Beatrice Lecher  CC: Left foot pain  HPI: For months this pleasant 46 year old female has had severe pain that she localizes on the plantar aspect of the heel, worse with the first few steps in the morning, severe, persistent. She has been treated with appropriate conservative measures including nighttime splinting, rehabilitation stretches, it. Of time in a boot. Unfortunately she has no improvement. Pain is localized without radiation.  Past medical history:  Negative.  See flowsheet/record as well for more information.  Surgical history: Negative.  See flowsheet/record as well for more information.  Family history: Negative.  See flowsheet/record as well for more information.  Social history: Negative.  See flowsheet/record as well for more information.  Allergies, and medications have been entered into the medical record, reviewed, and no changes needed.   Review of Systems: No headache, visual changes, nausea, vomiting, diarrhea, constipation, dizziness, abdominal pain, skin rash, fevers, chills, night sweats, weight loss, swollen lymph nodes, body aches, joint swelling, muscle aches, chest pain, shortness of breath, mood changes, visual or auditory hallucinations.   Objective:   General: Well Developed, well nourished, and in no acute distress.  Neuro/Psych: Alert and oriented x3, extra-ocular muscles intact, able to move all 4 extremities, sensation grossly intact. Skin: Warm and dry, no rashes noted.  Respiratory: Not using accessory muscles, speaking in full sentences, trachea midline.  Cardiovascular: Pulses palpable, no extremity edema. Abdomen: Does not appear distended. Left Foot: No visible erythema or swelling. Range of motion is full in all directions. Strength is 5/5 in all directions. No hallux valgus. No pes cavus or pes planus. No abnormal callus noted. No pain over the navicular prominence,  or base of fifth metatarsal. Severe tenderness to palpation of the calcaneal insertion of plantar fascia. No pain at the Achilles insertion. No pain over the calcaneal bursa. No pain of the retrocalcaneal bursa. No tenderness to palpation over the tarsals, metatarsals, or phalanges. No hallux rigidus or limitus. No tenderness palpation over interphalangeal joints. No pain with compression of the metatarsal heads. Neurovascularly intact distally.  Procedure: Real-time Ultrasound Guided Injection of left plantar fascia origin Device: GE Logiq E  Verbal informed consent obtained.  Time-out conducted.  Noted no overlying erythema, induration, or other signs of local infection.  Skin prepped in a sterile fashion.  Local anesthesia: Topical Ethyl chloride.  With sterile technique and under real time ultrasound guidance:  25-gauge needle advanced to the origin of the plantar fascia at the calcaneus and I injected 1 mL Kenalog 40, 1 mL lidocaine, 1 mL bupivacaine Completed without difficulty  Pain immediately resolved suggesting accurate placement of the medication.  Advised to call if fevers/chills, erythema, induration, drainage, or persistent bleeding.  Images permanently stored and available for review in the ultrasound unit.  Impression: Technically successful ultrasound guided injection.  I then applied the air heel brace.  Impression and Recommendations:   This case required medical decision making of moderate complexity.  Plantar fasciitis of left foot Has done all the conservative measures including nighttime splinting, CAM boot, rehabilitation exercises. At this point she is a candidate for interventional treatment, plantar fascia injection, adding the air heel brace.  Return in one month.

## 2017-01-03 ENCOUNTER — Other Ambulatory Visit: Payer: Self-pay | Admitting: Family Medicine

## 2017-01-10 ENCOUNTER — Other Ambulatory Visit: Payer: Self-pay | Admitting: *Deleted

## 2017-01-10 ENCOUNTER — Encounter: Payer: Self-pay | Admitting: Family Medicine

## 2017-01-10 MED ORDER — ALPRAZOLAM 2 MG PO TABS: 2.0000 mg | ORAL_TABLET | Freq: Every evening | ORAL | 0 refills | Status: DC | PRN

## 2017-01-10 NOTE — Progress Notes (Signed)
rx sent.[name] Lynetta  

## 2017-01-16 ENCOUNTER — Telehealth: Payer: Self-pay | Admitting: Family Medicine

## 2017-01-16 NOTE — Telephone Encounter (Signed)
Pt called clinic stating her insurance was charging her a $100 deductible for her most recent bone scan. Pt reports he was filled as screen instead of preventative. She was advised by insurance if we submitted clinicals the co pay may be able to get waived.  Called insurance (call ref: [id number]), was advised to fax clinical info to medical necessity department at [phone]. Info faxed.

## 2017-02-03 ENCOUNTER — Other Ambulatory Visit: Payer: Self-pay | Admitting: Family Medicine

## 2017-02-08 ENCOUNTER — Encounter: Payer: Self-pay | Admitting: Family Medicine

## 2017-02-10 MED ORDER — ALPRAZOLAM 2 MG PO TABS: 2.0000 mg | ORAL_TABLET | Freq: Every evening | ORAL | 4 refills | Status: DC | PRN

## 2017-03-06 ENCOUNTER — Other Ambulatory Visit: Payer: Self-pay | Admitting: Family Medicine

## 2017-03-06 DIAGNOSIS — H47093 Other disorders of optic nerve, not elsewhere classified, bilateral: Secondary | ICD-10-CM | POA: Diagnosis not present

## 2017-04-04 ENCOUNTER — Other Ambulatory Visit: Payer: Self-pay | Admitting: Family Medicine

## 2017-04-17 ENCOUNTER — Telehealth: Payer: Self-pay | Admitting: Family Medicine

## 2017-04-17 DIAGNOSIS — F430 Acute stress reaction: Secondary | ICD-10-CM

## 2017-04-17 NOTE — Telephone Encounter (Signed)
OK please let her know referral placed. Will start working on it.

## 2017-04-17 NOTE — Telephone Encounter (Signed)
Please put in a referral for pt to go to Counseling in [place] area as soon as possible.Marland KitchenMarland KitchenMarland Kitchen

## 2017-04-18 NOTE — Telephone Encounter (Signed)
Patient advised.

## 2017-04-27 DIAGNOSIS — F4323 Adjustment disorder with mixed anxiety and depressed mood: Secondary | ICD-10-CM | POA: Diagnosis not present

## 2017-05-01 DIAGNOSIS — F4323 Adjustment disorder with mixed anxiety and depressed mood: Secondary | ICD-10-CM | POA: Diagnosis not present

## 2017-05-04 DIAGNOSIS — F4323 Adjustment disorder with mixed anxiety and depressed mood: Secondary | ICD-10-CM | POA: Diagnosis not present

## 2017-05-05 ENCOUNTER — Other Ambulatory Visit: Payer: Self-pay | Admitting: Family Medicine

## 2017-05-09 DIAGNOSIS — F4323 Adjustment disorder with mixed anxiety and depressed mood: Secondary | ICD-10-CM | POA: Diagnosis not present

## 2017-05-15 ENCOUNTER — Encounter: Payer: Self-pay | Admitting: Family Medicine

## 2017-05-15 ENCOUNTER — Ambulatory Visit (INDEPENDENT_AMBULATORY_CARE_PROVIDER_SITE_OTHER): Payer: Federal, State, Local not specified - PPO | Admitting: Family Medicine

## 2017-05-15 VITALS — BP 113/78 | HR 77 | Ht 66.0 in | Wt 140.0 lb

## 2017-05-15 DIAGNOSIS — T887XXA Unspecified adverse effect of drug or medicament, initial encounter: Secondary | ICD-10-CM | POA: Diagnosis not present

## 2017-05-15 DIAGNOSIS — J0100 Acute maxillary sinusitis, unspecified: Secondary | ICD-10-CM

## 2017-05-15 DIAGNOSIS — F339 Major depressive disorder, recurrent, unspecified: Secondary | ICD-10-CM

## 2017-05-15 DIAGNOSIS — Z13220 Encounter for screening for lipoid disorders: Secondary | ICD-10-CM

## 2017-05-15 DIAGNOSIS — Z23 Encounter for immunization: Secondary | ICD-10-CM | POA: Diagnosis not present

## 2017-05-15 DIAGNOSIS — D510 Vitamin B12 deficiency anemia due to intrinsic factor deficiency: Secondary | ICD-10-CM | POA: Diagnosis not present

## 2017-05-15 DIAGNOSIS — Z79899 Other long term (current) drug therapy: Secondary | ICD-10-CM | POA: Diagnosis not present

## 2017-05-15 LAB — CBC WITH DIFFERENTIAL/PLATELET
BASOS ABS: 50 {cells}/uL (ref 0–200)
Basophils Relative: 0.9 %
EOS PCT: 3.4 %
Eosinophils Absolute: 190 cells/uL (ref 15–500)
HCT: 38.8 % (ref 35.0–45.0)
HEMOGLOBIN: 13.5 g/dL (ref 11.7–15.5)
Lymphs Abs: 1568 cells/uL (ref 850–3900)
MCH: 29.9 pg (ref 27.0–33.0)
MCHC: 34.8 g/dL (ref 32.0–36.0)
MCV: 86 fL (ref 80.0–100.0)
MPV: 10.1 fL (ref 7.5–12.5)
Monocytes Relative: 9.3 %
NEUTROS ABS: 3270 {cells}/uL (ref 1500–7800)
Neutrophils Relative %: 58.4 %
Platelets: 302 10*3/uL (ref 140–400)
RBC: 4.51 10*6/uL (ref 3.80–5.10)
RDW: 12.1 % (ref 11.0–15.0)
Total Lymphocyte: 28 %
WBC: 5.6 10*3/uL (ref 3.8–10.8)
WBCMIX: 521 {cells}/uL (ref 200–950)

## 2017-05-15 LAB — COMPLETE METABOLIC PANEL WITH GFR
AG Ratio: 2 (calc) (ref 1.0–2.5)
ALBUMIN MSPROF: 4.5 g/dL (ref 3.6–5.1)
ALKALINE PHOSPHATASE (APISO): 77 U/L (ref 33–115)
ALT: 11 U/L (ref 6–29)
AST: 14 U/L (ref 10–35)
BUN: 8 mg/dL (ref 7–25)
CALCIUM: 9.8 mg/dL (ref 8.6–10.2)
CO2: 32 mmol/L (ref 20–32)
CREATININE: 0.68 mg/dL (ref 0.50–1.10)
Chloride: 104 mmol/L (ref 98–110)
GFR, EST NON AFRICAN AMERICAN: 103 mL/min/{1.73_m2} (ref >=60)
GFR, Est African American: 120 mL/min/{1.73_m2} (ref >=60)
GLUCOSE: 94 mg/dL (ref 65–99)
Globulin: 2.3 g/dL (calc) (ref 1.9–3.7)
Potassium: 4.5 mmol/L (ref 3.5–5.3)
SODIUM: 141 mmol/L (ref 135–146)
TOTAL PROTEIN: 6.8 g/dL (ref 6.1–8.1)
Total Bilirubin: 0.4 mg/dL (ref 0.2–1.2)

## 2017-05-15 LAB — TSH: TSH: 1.33 m[IU]/L

## 2017-05-15 LAB — LIPID PANEL W/REFLEX DIRECT LDL
CHOL/HDL RATIO: 2 (calc) (ref <5.0)
CHOLESTEROL: 158 mg/dL (ref <200)
HDL: 79 mg/dL (ref >50)
LDL CHOLESTEROL (CALC): 61 mg/dL
Non-HDL Cholesterol (Calc): 79 mg/dL (calc) (ref <130)
TRIGLYCERIDES: 98 mg/dL (ref <150)

## 2017-05-15 MED ORDER — AZITHROMYCIN 250 MG PO TABS: ORAL_TABLET | ORAL | 0 refills | Status: AC

## 2017-05-15 MED ORDER — FUROSEMIDE 20 MG PO TABS: 20.0000 mg | ORAL_TABLET | Freq: Every day | ORAL | 3 refills | Status: DC | PRN

## 2017-05-15 MED ORDER — LEVOMILNACIPRAN HCL ER 80 MG PO CP24: 1.0000 | ORAL_CAPSULE | Freq: Every day | ORAL | 5 refills | Status: DC

## 2017-05-15 NOTE — Progress Notes (Signed)
Subjective:    CC:   HPI: Up-to-date since last office visit: Her husband was deployed overseas. When he came back home he did not actually come home and has been in very little contact with her. She doesn't really know what's going on with him. He is living several hours away with his parents currently. He has not gone back to work since [place] to the states. She is encouraged him to try to get some emotional support and help. And she herself is been very distraught over the situation. She plans on trying to get back to work. She quit working a couple of years ago with his support. She still has her nursing license.   F/U Insomnia - says the xanax for sleep seems like not even working right now. Mostly because of the current situation.  Has tried severeal things in the past.   F/U ADD - Not taking them currently.  But will restart when gets a new job.  She is trying to start working again.   Depression /anxiety - seeing her therapist 2 x pwer week and now seeing him once a week. She actually says this is been super helpful.  Left facial pain and HA and now radiating into her teeth on the left side of her face x 1.5 weeks.  Taking OTC sinus meds and aleve. Lot of drainage.  Left ear pain. No fever.  She's worried she may have a sinus infection.  Past medical history, Surgical history, Family history not pertinant except as noted below, Social history, Allergies, and medications have been entered into the medical record, reviewed, and corrections made.   Review of Systems: No fevers, chills, night sweats, weight loss, chest pain, or shortness of breath.   Objective:    General: Well Developed, well nourished, and in no acute distress.  Neuro: Alert and oriented x3, extra-ocular muscles intact, sensation grossly intact.  HEENT: Normocephalic, atraumatic  Skin: Warm and dry, no rashes. Cardiac: Regular rate and rhythm, no murmurs rubs or gallops, no lower extremity edema.  Respiratory:  Clear to auscultation bilaterally. Not using accessory muscles, speaking in full sentences.   Impression and Recommendations:     Left maxillary sinusitis - will tx with azithro. Call if not better in one week.   Depression/Anxiety - PHQ- 9 score of 18. Not doing well overall but sounds like she is really trying to get herself in a better place. She seeing her therapist regularly and has no active thoughts of going to harm herself today. Continue with therapy. Call at any point if having any difficulty or problem.. Currently on Fetzima.    Chronic insomnia - uses 2 xanax at bedtime. Plans to wean when she is dong better.    ADD - Stable. Will refill meds when she starts working again.   Insomnia - not well controlled currently but mostly from acute emotional distress. Continue to monitor.   Flu vaccine given.

## 2017-05-16 DIAGNOSIS — F4323 Adjustment disorder with mixed anxiety and depressed mood: Secondary | ICD-10-CM | POA: Diagnosis not present

## 2017-05-29 ENCOUNTER — Ambulatory Visit (INDEPENDENT_AMBULATORY_CARE_PROVIDER_SITE_OTHER): Payer: Federal, State, Local not specified - PPO | Admitting: Family Medicine

## 2017-05-29 ENCOUNTER — Ambulatory Visit: Payer: Federal, State, Local not specified - PPO

## 2017-05-29 VITALS — BP 132/76 | HR 90

## 2017-05-29 DIAGNOSIS — Z23 Encounter for immunization: Secondary | ICD-10-CM | POA: Diagnosis not present

## 2017-05-29 NOTE — Progress Notes (Signed)
Pt in today for MMR vaccine.  Given right arm.

## 2017-06-01 DIAGNOSIS — F4323 Adjustment disorder with mixed anxiety and depressed mood: Secondary | ICD-10-CM | POA: Diagnosis not present

## 2017-06-09 ENCOUNTER — Ambulatory Visit: Payer: Federal, State, Local not specified - PPO | Admitting: Sports Medicine

## 2017-06-09 ENCOUNTER — Encounter: Payer: Self-pay | Admitting: Sports Medicine

## 2017-06-09 ENCOUNTER — Other Ambulatory Visit: Payer: Self-pay | Admitting: Sports Medicine

## 2017-06-09 ENCOUNTER — Ambulatory Visit (INDEPENDENT_AMBULATORY_CARE_PROVIDER_SITE_OTHER): Payer: Federal, State, Local not specified - PPO

## 2017-06-09 DIAGNOSIS — S63501A Unspecified sprain of right wrist, initial encounter: Secondary | ICD-10-CM

## 2017-06-09 DIAGNOSIS — M79641 Pain in right hand: Secondary | ICD-10-CM | POA: Diagnosis not present

## 2017-06-09 DIAGNOSIS — W000XXA Fall on same level due to ice and snow, initial encounter: Secondary | ICD-10-CM | POA: Diagnosis not present

## 2017-06-09 DIAGNOSIS — S6991XA Unspecified injury of right wrist, hand and finger(s), initial encounter: Secondary | ICD-10-CM

## 2017-06-09 DIAGNOSIS — M25531 Pain in right wrist: Secondary | ICD-10-CM | POA: Diagnosis not present

## 2017-06-09 MED ORDER — HYDROCODONE-ACETAMINOPHEN 5-325 MG PO TABS: 1.0000 | ORAL_TABLET | Freq: Three times a day (TID) | ORAL | 0 refills | Status: DC | PRN

## 2017-06-09 NOTE — Assessment & Plan Note (Addendum)
Hand and wrist x-rays are unremarkable. Pain predominantly over the ulnar styloid process and TFCC. There is evidence of an old ulnar fracture, well-healed. Return to see me in 2 weeks, Velcro wrist brace, hydrocodone for pain. If persistent pain we will put her in a cast, with a suspicion for a TFCC tear in the back of our heads.

## 2017-06-09 NOTE — Progress Notes (Signed)
   Subjective:    I'm seeing this patient as a consultation for: Dr. Beatrice Lecher  CC: Right wrist injury  HPI: This morning this pleasant 46 year old female slipped and fell on the ice, onto an outstretched right hand, she had immediate pain, swelling, inability to use the hand.  She then had a CPR class this morning, tried to use the wrist and it hurt more.  Pain is predominantly over the ulnar aspect of the wrist, severe, persistent without radiation.  Past medical history, Surgical history, Family history not pertinant except as noted below, Social history, Allergies, and medications have been entered into the medical record, reviewed, and no changes needed.   Review of Systems: No headache, visual changes, nausea, vomiting, diarrhea, constipation, dizziness, abdominal pain, skin rash, fevers, chills, night sweats, weight loss, swollen lymph nodes, body aches, joint swelling, muscle aches, chest pain, shortness of breath, mood changes, visual or auditory hallucinations.   Objective:   General: Well Developed, well nourished, and in no acute distress.  Neuro:  Extra-ocular muscles intact, able to move all 4 extremities, sensation grossly intact.  Deep tendon reflexes tested were normal. Psych: Alert and oriented, mood congruent with affect. ENT:  Ears and nose appear unremarkable.  Hearing grossly normal. Neck: Unremarkable overall appearance, trachea midline.  No visible thyroid enlargement. Eyes: Conjunctivae and lids appear unremarkable.  Pupils equal and round. Skin: Warm and dry, no rashes noted.  Cardiovascular: Pulses palpable, no extremity edema. Right wrist: Only minimal swelling over the ulnar aspect of the wrist Range of motion expectedly limited by pain Tender to palpation over the extensor carpi ulnaris, as well as TFCC. No snuffbox tenderness. No tenderness over Canal of Guyon. Strength 5/5 in all directions without pain. Negative tinel's and phalens  signs. Negative Finkelstein sign. Negative Watson's test.  X-rays personally reviewed, there is evidence of an old ulnar shaft fracture, otherwise negative for anything acute.  Impression and Recommendations:   This case required medical decision making of moderate complexity.  Right wrist sprain Hand and wrist x-rays are unremarkable. Pain predominantly over the ulnar styloid process and TFCC. There is evidence of an old ulnar fracture, well-healed. Return to see me in 2 weeks, Velcro wrist brace, hydrocodone for pain. If persistent pain we will put her in a cast, with a suspicion for a TFCC tear in the back of our heads.  ___________________________________________ Gwen Her. Dianah Field, M.D., ABFM., CAQSM. Primary Care and [place] Instructor of [place] [employer] [facility] [employer]

## 2017-06-13 ENCOUNTER — Telehealth: Payer: Self-pay

## 2017-06-13 ENCOUNTER — Encounter: Payer: Self-pay | Admitting: Sports Medicine

## 2017-06-13 ENCOUNTER — Ambulatory Visit: Payer: Federal, State, Local not specified - PPO | Admitting: Sports Medicine

## 2017-06-13 ENCOUNTER — Other Ambulatory Visit: Payer: Self-pay | Admitting: Family Medicine

## 2017-06-13 DIAGNOSIS — S63501D Unspecified sprain of right wrist, subsequent encounter: Secondary | ICD-10-CM

## 2017-06-13 DIAGNOSIS — F4323 Adjustment disorder with mixed anxiety and depressed mood: Secondary | ICD-10-CM | POA: Diagnosis not present

## 2017-06-13 NOTE — Progress Notes (Signed)
  Subjective:    CC: Persistent pain  HPI: [name] returns, she had a slip and fall onto an outstretched hand, x-rays were initially negative however she has developed some bruising, pain predominantly over the ulnar aspect of the right wrist, Velcro brace is ineffective with controlling her pain and immobilizing her wrist, pain is severe, worsening.  Past medical history:  Negative.  See flowsheet/record as well for more information.  Surgical history: Negative.  See flowsheet/record as well for more information.  Family history: Negative.  See flowsheet/record as well for more information.  Social history: Negative.  See flowsheet/record as well for more information.  Allergies, and medications have been entered into the medical record, reviewed, and no changes needed.   Review of Systems: No fevers, chills, night sweats, weight loss, chest pain, or shortness of breath.   Objective:    General: Well Developed, well nourished, and in no acute distress.  Neuro: Alert and oriented x3, extra-ocular muscles intact, sensation grossly intact.  HEENT: Normocephalic, atraumatic, pupils equal round reactive to light, neck supple, no masses, no lymphadenopathy, thyroid nonpalpable.  Skin: Warm and dry, no rashes. Cardiac: Regular rate and rhythm, no murmurs rubs or gallops, no lower extremity edema.  Respiratory: Clear to auscultation bilaterally. Not using accessory muscles, speaking in full sentences. Right wrist: Swollen over the ulnar aspect, bruised volarly, tender to palpation over the sixth extensor compartment.  Neurovascularly intact distally.  Short arm cast placed  Impression and Recommendations:    Right wrist sprain Persistence of pain in spite of Velcro wrist brace, she has developed some bruising on the volar aspect. Transitioned into a short arm cast, return to see me in a month for this. I do suspect either an occult fracture or extensor carpi ulnaris  subluxation.  ___________________________________________ Gwen Her. Dianah Field, M.D., ABFM., CAQSM. Primary Care and [place] Instructor of [place] [facility] [employer]

## 2017-06-13 NOTE — Telephone Encounter (Signed)
She texted my cell phone, she is going to come in for a cast.

## 2017-06-13 NOTE — Assessment & Plan Note (Signed)
Persistence of pain in spite of Velcro wrist brace, she has developed some bruising on the volar aspect. Transitioned into a short arm cast, return to see me in a month for this. I do suspect either an occult fracture or extensor carpi ulnaris subluxation.

## 2017-06-13 NOTE — Telephone Encounter (Signed)
Pt left VM stating she is still having a lot of pain in the hand even with wearing the brace. Would like to know what she should do next? Please advise.

## 2017-06-24 ENCOUNTER — Other Ambulatory Visit: Payer: Self-pay | Admitting: Family Medicine

## 2017-06-29 DIAGNOSIS — F321 Major depressive disorder, single episode, moderate: Secondary | ICD-10-CM | POA: Diagnosis not present

## 2017-07-03 ENCOUNTER — Other Ambulatory Visit: Payer: Self-pay | Admitting: Family Medicine

## 2017-07-03 MED ORDER — ALPRAZOLAM 2 MG PO TABS: 2.0000 mg | ORAL_TABLET | Freq: Every evening | ORAL | 4 refills | Status: DC | PRN

## 2017-07-03 MED ORDER — BUPROPION HCL ER (XL) 150 MG PO TB24: 150.0000 mg | ORAL_TABLET | ORAL | 2 refills | Status: DC
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
## 2017-07-03 NOTE — Progress Notes (Signed)
Will add wellbutrin to help with mood and focus.

## 2017-07-11 ENCOUNTER — Ambulatory Visit: Payer: Federal, State, Local not specified - PPO | Admitting: Sports Medicine

## 2017-07-13 DIAGNOSIS — F321 Major depressive disorder, single episode, moderate: Secondary | ICD-10-CM | POA: Diagnosis not present

## 2017-07-24 ENCOUNTER — Encounter: Payer: Self-pay | Admitting: Family Medicine

## 2017-07-24 ENCOUNTER — Ambulatory Visit (INDEPENDENT_AMBULATORY_CARE_PROVIDER_SITE_OTHER): Payer: Federal, State, Local not specified - PPO | Admitting: Family Medicine

## 2017-07-24 ENCOUNTER — Ambulatory Visit (INDEPENDENT_AMBULATORY_CARE_PROVIDER_SITE_OTHER): Payer: Federal, State, Local not specified - PPO

## 2017-07-24 VITALS — Ht 65.5 in | Wt 135.0 lb

## 2017-07-24 DIAGNOSIS — M25531 Pain in right wrist: Secondary | ICD-10-CM

## 2017-07-24 DIAGNOSIS — M7989 Other specified soft tissue disorders: Secondary | ICD-10-CM | POA: Diagnosis not present
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
## 2017-07-24 NOTE — Patient Instructions (Signed)
Thank you for coming in today. Get xray today.  Recheck with Dr T in 1 month unless we change plans.  Continue brace at work and with activity. Ok for CPR at work with a brace.

## 2017-07-26 ENCOUNTER — Other Ambulatory Visit: Payer: Self-pay | Admitting: *Deleted

## 2017-07-26 MED ORDER — BUPROPION HCL ER (XL) 150 MG PO TB24: 150.0000 mg | ORAL_TABLET | ORAL | 2 refills | Status: DC

## 2017-07-27 DIAGNOSIS — F321 Major depressive disorder, single episode, moderate: Secondary | ICD-10-CM | POA: Diagnosis not present

## 2017-08-01 ENCOUNTER — Other Ambulatory Visit: Payer: Self-pay | Admitting: *Deleted

## 2017-08-01 MED ORDER — AMPHETAMINE-DEXTROAMPHETAMINE 20 MG PO TABS: 20.0000 mg | ORAL_TABLET | Freq: Two times a day (BID) | ORAL | 0 refills | Status: DC

## 2017-08-01 NOTE — Progress Notes (Signed)
Sent to pcp for signature.[name] [place]

## 2017-08-04 ENCOUNTER — Telehealth: Payer: Self-pay | Admitting: *Deleted

## 2017-08-04 NOTE — Telephone Encounter (Signed)
Pre Authorization sent to cover my meds. Amphetamine-Dextroamphetamine 20MG  OR TABS [id number]

## 2017-08-07 ENCOUNTER — Telehealth: Payer: Self-pay | Admitting: Family Medicine

## 2017-08-07 DIAGNOSIS — F339 Major depressive disorder, recurrent, unspecified: Secondary | ICD-10-CM

## 2017-08-07 NOTE — Telephone Encounter (Signed)
Patient needs a Urgent referral for psychiatrist and preferably in [place] area and if you do not know of one to do this, she can come here or [facility].  She was D/C from [facility] recovery this weekend and they want this Urgently

## 2017-08-07 NOTE — Telephone Encounter (Signed)
Referral placed.

## 2017-08-08 NOTE — Telephone Encounter (Signed)
Outcome  Approvedtoday  Your PA request has been approved. Additional information will be provided in the approval communication. (Message [id number])  pharmacy notified

## 2017-08-10 DIAGNOSIS — F321 Major depressive disorder, single episode, moderate: Secondary | ICD-10-CM | POA: Diagnosis not present

## 2017-08-18 ENCOUNTER — Encounter (HOSPITAL_COMMUNITY): Payer: Self-pay | Admitting: Psychiatry

## 2017-08-18 ENCOUNTER — Other Ambulatory Visit: Payer: Self-pay

## 2017-08-18 ENCOUNTER — Ambulatory Visit (INDEPENDENT_AMBULATORY_CARE_PROVIDER_SITE_OTHER): Payer: Federal, State, Local not specified - PPO | Admitting: Psychiatry

## 2017-08-18 VITALS — BP 112/80 | HR 76 | Ht 65.5 in | Wt 134.0 lb

## 2017-08-18 DIAGNOSIS — Z811 Family history of alcohol abuse and dependence: Secondary | ICD-10-CM | POA: Diagnosis not present

## 2017-08-18 DIAGNOSIS — F411 Generalized anxiety disorder: Secondary | ICD-10-CM

## 2017-08-18 DIAGNOSIS — F4323 Adjustment disorder with mixed anxiety and depressed mood: Secondary | ICD-10-CM

## 2017-08-18 DIAGNOSIS — Z630 Problems in relationship with spouse or partner: Secondary | ICD-10-CM

## 2017-08-18 DIAGNOSIS — F4310 Post-traumatic stress disorder, unspecified: Secondary | ICD-10-CM

## 2017-08-18 DIAGNOSIS — R450 Nervousness: Secondary | ICD-10-CM

## 2017-08-18 DIAGNOSIS — G4700 Insomnia, unspecified: Secondary | ICD-10-CM | POA: Diagnosis not present

## 2017-08-18 DIAGNOSIS — Z818 Family history of other mental and behavioral disorders: Secondary | ICD-10-CM

## 2017-08-18 DIAGNOSIS — F17210 Nicotine dependence, cigarettes, uncomplicated: Secondary | ICD-10-CM

## 2017-08-18 DIAGNOSIS — F331 Major depressive disorder, recurrent, moderate: Secondary | ICD-10-CM

## 2017-08-18 DIAGNOSIS — F5102 Adjustment insomnia: Secondary | ICD-10-CM

## 2017-08-18 MED ORDER — BUSPIRONE HCL 7.5 MG PO TABS: 7.5000 mg | ORAL_TABLET | Freq: Every day | ORAL | 0 refills | Status: DC | PRN
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
## 2017-08-25 ENCOUNTER — Other Ambulatory Visit: Payer: Self-pay | Admitting: Family Medicine

## 2017-08-25 DIAGNOSIS — F321 Major depressive disorder, single episode, moderate: Secondary | ICD-10-CM | POA: Diagnosis not present

## 2017-08-25 MED ORDER — ALPRAZOLAM 2 MG PO TABS: 2.0000 mg | ORAL_TABLET | Freq: Every evening | ORAL | 0 refills | Status: DC | PRN

## 2017-08-28 ENCOUNTER — Ambulatory Visit: Payer: Federal, State, Local not specified - PPO | Admitting: Sports Medicine

## 2017-08-28 DIAGNOSIS — M722 Plantar fascial fibromatosis: Secondary | ICD-10-CM
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
## 2017-08-28 NOTE — Assessment & Plan Note (Signed)
Approximately 8 months ago we did an injection on the left side, she is done well until recently.  Continue rehab exercises, air heel brace. Repeat injection today. Return as needed.

## 2017-09-07 DIAGNOSIS — H8112 Benign paroxysmal vertigo, left ear: Secondary | ICD-10-CM | POA: Diagnosis not present

## 2017-09-08 ENCOUNTER — Ambulatory Visit (HOSPITAL_COMMUNITY): Payer: Self-pay | Admitting: Psychiatry

## 2017-09-20 ENCOUNTER — Telehealth: Payer: Self-pay | Admitting: Family Medicine

## 2017-09-20 DIAGNOSIS — R69 Illness, unspecified: Principal | ICD-10-CM

## 2017-09-20 DIAGNOSIS — J111 Influenza due to unidentified influenza virus with other respiratory manifestations: Secondary | ICD-10-CM

## 2017-09-20 MED ORDER — PROMETHAZINE HCL 25 MG RE SUPP: 25.0000 mg | Freq: Four times a day (QID) | RECTAL | 0 refills | Status: DC | PRN

## 2017-09-20 MED ORDER — PROMETHAZINE HCL 25 MG PO TABS: 25.0000 mg | ORAL_TABLET | Freq: Four times a day (QID) | ORAL | 0 refills | Status: DC | PRN

## 2017-09-20 NOTE — Telephone Encounter (Signed)
Pt advised.

## 2017-09-20 NOTE — Telephone Encounter (Signed)
Patient called and notified us that she has gastroenteritis.  Requesting a prescription for Phenergan to sent to the pharmacy. [facility] oral [facility].  Please call patient and let her know this was done.

## 2017-09-27 DIAGNOSIS — M79672 Pain in left foot: Secondary | ICD-10-CM | POA: Diagnosis not present

## 2017-10-05 DIAGNOSIS — M79672 Pain in left foot: Secondary | ICD-10-CM | POA: Diagnosis not present

## 2017-10-13 DIAGNOSIS — M79672 Pain in left foot: Secondary | ICD-10-CM | POA: Diagnosis not present

## 2017-10-13 DIAGNOSIS — Z79899 Other long term (current) drug therapy: Secondary | ICD-10-CM | POA: Diagnosis not present

## 2017-10-13 DIAGNOSIS — M25572 Pain in left ankle and joints of left foot: Secondary | ICD-10-CM | POA: Diagnosis not present

## 2017-10-13 DIAGNOSIS — Z9689 Presence of other specified functional implants: Secondary | ICD-10-CM | POA: Diagnosis not present

## 2017-10-13 DIAGNOSIS — M65872 Other synovitis and tenosynovitis, left ankle and foot: Secondary | ICD-10-CM | POA: Diagnosis not present

## 2017-10-13 DIAGNOSIS — M2012 Hallux valgus (acquired), left foot: Secondary | ICD-10-CM | POA: Diagnosis not present

## 2017-10-13 DIAGNOSIS — M21612 Bunion of left foot: Secondary | ICD-10-CM | POA: Diagnosis not present

## 2017-10-13 DIAGNOSIS — Z885 Allergy status to narcotic agent status: Secondary | ICD-10-CM | POA: Diagnosis not present

## 2017-10-13 DIAGNOSIS — G90522 Complex regional pain syndrome I of left lower limb: Secondary | ICD-10-CM | POA: Diagnosis not present

## 2017-10-13 DIAGNOSIS — Z880 Allergy status to penicillin: Secondary | ICD-10-CM | POA: Diagnosis not present

## 2017-10-13 DIAGNOSIS — M659 Synovitis and tenosynovitis, unspecified: Secondary | ICD-10-CM | POA: Diagnosis not present

## 2017-10-13 DIAGNOSIS — F17200 Nicotine dependence, unspecified, uncomplicated: Secondary | ICD-10-CM | POA: Diagnosis not present

## 2017-10-15 ENCOUNTER — Other Ambulatory Visit: Payer: Self-pay | Admitting: Family Medicine

## 2017-10-23 DIAGNOSIS — M79672 Pain in left foot: Secondary | ICD-10-CM | POA: Diagnosis not present

## 2017-10-31 DIAGNOSIS — M79672 Pain in left foot: Secondary | ICD-10-CM | POA: Diagnosis not present

## 2017-11-06 DIAGNOSIS — M79672 Pain in left foot: Secondary | ICD-10-CM | POA: Diagnosis not present

## 2017-11-13 DIAGNOSIS — M21612 Bunion of left foot: Secondary | ICD-10-CM | POA: Diagnosis not present

## 2017-11-13 DIAGNOSIS — Z79899 Other long term (current) drug therapy: Secondary | ICD-10-CM | POA: Diagnosis not present

## 2017-11-13 DIAGNOSIS — G90522 Complex regional pain syndrome I of left lower limb: Secondary | ICD-10-CM | POA: Diagnosis not present

## 2017-11-13 DIAGNOSIS — Z5181 Encounter for therapeutic drug level monitoring: Secondary | ICD-10-CM | POA: Diagnosis not present

## 2017-11-13 DIAGNOSIS — G894 Chronic pain syndrome: Secondary | ICD-10-CM | POA: Diagnosis not present

## 2017-11-13 DIAGNOSIS — M792 Neuralgia and neuritis, unspecified: Secondary | ICD-10-CM | POA: Diagnosis not present

## 2017-11-20 DIAGNOSIS — M79672 Pain in left foot: Secondary | ICD-10-CM | POA: Diagnosis not present

## 2017-11-29 ENCOUNTER — Other Ambulatory Visit: Payer: Self-pay | Admitting: Family Medicine

## 2017-11-29 DIAGNOSIS — F339 Major depressive disorder, recurrent, unspecified: Secondary | ICD-10-CM

## 2017-12-12 DIAGNOSIS — G90522 Complex regional pain syndrome I of left lower limb: Secondary | ICD-10-CM | POA: Diagnosis not present

## 2017-12-12 DIAGNOSIS — M792 Neuralgia and neuritis, unspecified: Secondary | ICD-10-CM | POA: Diagnosis not present

## 2017-12-12 DIAGNOSIS — G894 Chronic pain syndrome: Secondary | ICD-10-CM | POA: Diagnosis not present

## 2017-12-21 ENCOUNTER — Other Ambulatory Visit: Payer: Self-pay

## 2017-12-21 MED ORDER — ALPRAZOLAM 2 MG PO TABS: 2.0000 mg | ORAL_TABLET | Freq: Every evening | ORAL | 2 refills | Status: DC | PRN

## 2017-12-21 MED ORDER — BUPROPION HCL ER (XL) 150 MG PO TB24: 150.0000 mg | ORAL_TABLET | ORAL | 2 refills | Status: DC

## 2017-12-26 DIAGNOSIS — M79672 Pain in left foot: Secondary | ICD-10-CM | POA: Diagnosis not present

## 2018-01-10 ENCOUNTER — Other Ambulatory Visit: Payer: Self-pay | Admitting: *Deleted

## 2018-01-10 MED ORDER — PROCHLORPERAZINE MALEATE 5 MG PO TABS: ORAL_TABLET | ORAL | 1 refills | Status: DC

## 2018-03-21 ENCOUNTER — Ambulatory Visit: Payer: Federal, State, Local not specified - PPO | Admitting: Family Medicine

## 2018-03-21 ENCOUNTER — Encounter: Payer: Self-pay | Admitting: Family Medicine

## 2018-03-21 VITALS — BP 121/76 | HR 87 | Wt 121.6 lb

## 2018-03-21 DIAGNOSIS — L659 Nonscarring hair loss, unspecified: Secondary | ICD-10-CM

## 2018-03-21 DIAGNOSIS — R5383 Other fatigue: Secondary | ICD-10-CM

## 2018-03-21 DIAGNOSIS — R498 Other voice and resonance disorders: Secondary | ICD-10-CM | POA: Diagnosis not present

## 2018-03-21 DIAGNOSIS — F339 Major depressive disorder, recurrent, unspecified: Secondary | ICD-10-CM

## 2018-03-21 DIAGNOSIS — R499 Unspecified voice and resonance disorder: Secondary | ICD-10-CM

## 2018-03-21 MED ORDER — PROCHLORPERAZINE MALEATE 5 MG PO TABS: ORAL_TABLET | ORAL | 1 refills | Status: DC

## 2018-03-21 MED ORDER — OMEPRAZOLE 40 MG PO CPDR: 40.0000 mg | DELAYED_RELEASE_CAPSULE | Freq: Every morning | ORAL | 3 refills | Status: DC

## 2018-03-21 MED ORDER — ACYCLOVIR 400 MG PO TABS: ORAL_TABLET | ORAL | 2 refills | Status: DC

## 2018-03-21 MED ORDER — BUPROPION HCL ER (XL) 150 MG PO TB24: 150.0000 mg | ORAL_TABLET | ORAL | 3 refills | Status: DC

## 2018-03-21 MED ORDER — LEVOMILNACIPRAN HCL ER 20 MG PO CP24: ORAL_CAPSULE | ORAL | 0 refills | Status: DC

## 2018-03-21 MED ORDER — SUMATRIPTAN SUCCINATE 50 MG PO TABS: ORAL_TABLET | ORAL | 3 refills | Status: DC

## 2018-03-21 MED ORDER — VENLAFAXINE HCL ER 37.5 MG PO CP24: ORAL_CAPSULE | ORAL | 0 refills | Status: DC
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
## 2018-03-21 NOTE — Patient Instructions (Signed)
Once you decrease the Fetzima to every other day then can start the 37.5 Venlafaxine daily. So you will overlap then for 6 days.   Once you have been on 3 tabs of the Venlafaxine daily for 2 weeks then we can send over a new script for the Venlafaxin 150 mg tabs. Just let me know when close to needing new script.

## 2018-03-22 LAB — COMPLETE METABOLIC PANEL WITH GFR
AG Ratio: 1.9 (calc) (ref 1.0–2.5)
ALBUMIN MSPROF: 4.9 g/dL (ref 3.6–5.1)
ALKALINE PHOSPHATASE (APISO): 94 U/L (ref 33–115)
ALT: 10 U/L (ref 6–29)
AST: 12 U/L (ref 10–35)
BUN: 9 mg/dL (ref 7–25)
CO2: 30 mmol/L (ref 20–32)
CREATININE: 0.8 mg/dL (ref 0.50–1.10)
Calcium: 10 mg/dL (ref 8.6–10.2)
Chloride: 101 mmol/L (ref 98–110)
GFR, Est African American: 100 mL/min/{1.73_m2} (ref >=60)
GFR, Est Non African American: 87 mL/min/{1.73_m2} (ref >=60)
GLUCOSE: 92 mg/dL (ref 65–99)
Globulin: 2.6 g/dL (calc) (ref 1.9–3.7)
Potassium: 4.3 mmol/L (ref 3.5–5.3)
Sodium: 140 mmol/L (ref 135–146)
TOTAL PROTEIN: 7.5 g/dL (ref 6.1–8.1)
Total Bilirubin: 0.6 mg/dL (ref 0.2–1.2)

## 2018-03-22 LAB — LIPID PANEL
CHOL/HDL RATIO: 3.4 (calc) (ref <5.0)
Cholesterol: 251 mg/dL — ABNORMAL HIGH (ref <200)
HDL: 74 mg/dL (ref >50)
LDL CHOLESTEROL (CALC): 157 mg/dL — AB
Non-HDL Cholesterol (Calc): 177 mg/dL (calc) — ABNORMAL HIGH (ref <130)
TRIGLYCERIDES: 91 mg/dL (ref <150)

## 2018-03-22 LAB — CBC
HEMATOCRIT: 41.9 % (ref 35.0–45.0)
HEMOGLOBIN: 14.1 g/dL (ref 11.7–15.5)
MCH: 29.3 pg (ref 27.0–33.0)
MCHC: 33.7 g/dL (ref 32.0–36.0)
MCV: 86.9 fL (ref 80.0–100.0)
MPV: 9.3 fL (ref 7.5–12.5)
Platelets: 387 10*3/uL (ref 140–400)
RBC: 4.82 10*6/uL (ref 3.80–5.10)
RDW: 12.1 % (ref 11.0–15.0)
WBC: 5.3 10*3/uL (ref 3.8–10.8)

## 2018-03-22 LAB — VITAMIN D 25 HYDROXY (VIT D DEFICIENCY, FRACTURES): VIT D 25 HYDROXY: 20 ng/mL — AB (ref 30–100)

## 2018-03-22 LAB — FERRITIN: Ferritin: 74 ng/mL (ref 16–232)

## 2018-03-22 LAB — TSH: TSH: 1.38 mIU/L

## 2018-04-10 ENCOUNTER — Other Ambulatory Visit: Payer: Self-pay

## 2018-04-10 MED ORDER — ALPRAZOLAM 2 MG PO TABS: 2.0000 mg | ORAL_TABLET | Freq: Every evening | ORAL | 2 refills | Status: DC | PRN

## 2018-04-10 NOTE — Telephone Encounter (Signed)
[name] request a refill to [facility].

## 2018-04-12 DIAGNOSIS — R490 Dysphonia: Secondary | ICD-10-CM | POA: Diagnosis not present

## 2018-04-12 DIAGNOSIS — J383 Other diseases of vocal cords: Secondary | ICD-10-CM | POA: Diagnosis not present

## 2018-04-20 ENCOUNTER — Encounter: Payer: Self-pay | Admitting: Sports Medicine

## 2018-04-20 ENCOUNTER — Ambulatory Visit (INDEPENDENT_AMBULATORY_CARE_PROVIDER_SITE_OTHER): Payer: Federal, State, Local not specified - PPO | Admitting: Sports Medicine

## 2018-04-20 DIAGNOSIS — M722 Plantar fascial fibromatosis: Secondary | ICD-10-CM
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
## 2018-04-20 NOTE — Assessment & Plan Note (Signed)
Last injected February 2019, she has overall done well with rehab exercises, air heel brace. Repeat injection today. Continue rehab, air heel brace. Return to see me as needed.

## 2018-04-25 ENCOUNTER — Other Ambulatory Visit: Payer: Self-pay | Admitting: Family Medicine

## 2018-04-25 MED ORDER — ROSUVASTATIN CALCIUM 10 MG PO TABS: ORAL_TABLET | ORAL | 3 refills | Status: DC

## 2018-04-25 MED ORDER — AMPHETAMINE-DEXTROAMPHETAMINE 20 MG PO TABS: 20.0000 mg | ORAL_TABLET | Freq: Two times a day (BID) | ORAL | 0 refills | Status: DC

## 2018-04-26 DIAGNOSIS — M21612 Bunion of left foot: Secondary | ICD-10-CM | POA: Diagnosis not present

## 2018-04-26 DIAGNOSIS — G894 Chronic pain syndrome: Secondary | ICD-10-CM | POA: Diagnosis not present

## 2018-04-26 DIAGNOSIS — G90522 Complex regional pain syndrome I of left lower limb: Secondary | ICD-10-CM | POA: Diagnosis not present

## 2018-04-26 DIAGNOSIS — M792 Neuralgia and neuritis, unspecified: Secondary | ICD-10-CM | POA: Diagnosis not present

## 2018-05-08 ENCOUNTER — Telehealth: Payer: Self-pay | Admitting: *Deleted

## 2018-05-08 MED ORDER — VENLAFAXINE HCL ER 150 MG PO CP24: 150.0000 mg | ORAL_CAPSULE | Freq: Every day | ORAL | 1 refills | Status: DC

## 2018-05-08 NOTE — Telephone Encounter (Signed)
rx sent for 150mg  dose.

## 2018-05-08 NOTE — Telephone Encounter (Signed)
Pt wanted to know if you wanted her to stay on current dose of effexor she currently takes 3 tabs 37.5 mg daily? Or did you want to increase this? Either way she will need a refill sent to [facility].Elouise Munroe, [place]

## 2018-05-09 NOTE — Telephone Encounter (Signed)
Pt advised.

## 2018-06-08 ENCOUNTER — Ambulatory Visit (INDEPENDENT_AMBULATORY_CARE_PROVIDER_SITE_OTHER): Payer: Federal, State, Local not specified - PPO

## 2018-06-08 ENCOUNTER — Encounter: Payer: Self-pay | Admitting: Physician Assistant

## 2018-06-08 ENCOUNTER — Ambulatory Visit (INDEPENDENT_AMBULATORY_CARE_PROVIDER_SITE_OTHER): Payer: Federal, State, Local not specified - PPO | Admitting: Physician Assistant

## 2018-06-08 VITALS — BP 112/74 | HR 116 | Temp 100.1°F | Ht 66.0 in | Wt 117.0 lb

## 2018-06-08 DIAGNOSIS — R05 Cough: Secondary | ICD-10-CM

## 2018-06-08 DIAGNOSIS — R52 Pain, unspecified: Secondary | ICD-10-CM | POA: Diagnosis not present

## 2018-06-08 DIAGNOSIS — R110 Nausea: Secondary | ICD-10-CM | POA: Diagnosis not present

## 2018-06-08 DIAGNOSIS — R1012 Left upper quadrant pain: Secondary | ICD-10-CM | POA: Diagnosis not present

## 2018-06-08 DIAGNOSIS — R059 Cough, unspecified: Secondary | ICD-10-CM

## 2018-06-08 DIAGNOSIS — R079 Chest pain, unspecified: Secondary | ICD-10-CM | POA: Diagnosis not present

## 2018-06-08 DIAGNOSIS — R0602 Shortness of breath: Secondary | ICD-10-CM

## 2018-06-08 DIAGNOSIS — R197 Diarrhea, unspecified: Secondary | ICD-10-CM

## 2018-06-08 LAB — COMPLETE METABOLIC PANEL WITH GFR
AG Ratio: 2.1 (calc) (ref 1.0–2.5)
ALT: 10 U/L (ref 6–29)
AST: 12 U/L (ref 10–35)
Albumin: 4.6 g/dL (ref 3.6–5.1)
Alkaline phosphatase (APISO): 91 U/L (ref 33–115)
BUN: 8 mg/dL (ref 7–25)
CALCIUM: 9.8 mg/dL (ref 8.6–10.2)
CO2: 32 mmol/L (ref 20–32)
Chloride: 102 mmol/L (ref 98–110)
Creat: 0.77 mg/dL (ref 0.50–1.10)
GFR, EST NON AFRICAN AMERICAN: 91 mL/min/{1.73_m2} (ref >=60)
GFR, Est African American: 105 mL/min/{1.73_m2} (ref >=60)
Globulin: 2.2 g/dL (calc) (ref 1.9–3.7)
Glucose, Bld: 94 mg/dL (ref 65–99)
Potassium: 4.7 mmol/L (ref 3.5–5.3)
Sodium: 139 mmol/L (ref 135–146)
Total Bilirubin: 0.5 mg/dL (ref 0.2–1.2)
Total Protein: 6.8 g/dL (ref 6.1–8.1)

## 2018-06-08 LAB — CBC WITH DIFFERENTIAL/PLATELET
BASOS PCT: 0.5 %
Basophils Absolute: 33 cells/uL (ref 0–200)
EOS PCT: 1.2 %
Eosinophils Absolute: 79 cells/uL (ref 15–500)
HEMATOCRIT: 39.2 % (ref 35.0–45.0)
Hemoglobin: 13.7 g/dL (ref 11.7–15.5)
LYMPHS ABS: 1683 {cells}/uL (ref 850–3900)
MCH: 29.8 pg (ref 27.0–33.0)
MCHC: 34.9 g/dL (ref 32.0–36.0)
MCV: 85.2 fL (ref 80.0–100.0)
MPV: 9.6 fL (ref 7.5–12.5)
Monocytes Relative: 9.1 %
Neutro Abs: 4204 cells/uL (ref 1500–7800)
Neutrophils Relative %: 63.7 %
PLATELETS: 368 10*3/uL (ref 140–400)
RBC: 4.6 10*6/uL (ref 3.80–5.10)
RDW: 12.4 % (ref 11.0–15.0)
TOTAL LYMPHOCYTE: 25.5 %
WBC: 6.6 10*3/uL (ref 3.8–10.8)
WBCMIX: 601 {cells}/uL (ref 200–950)

## 2018-06-08 LAB — POCT URINALYSIS DIPSTICK
BILIRUBIN UA: NEGATIVE
Glucose, UA: NEGATIVE
KETONES UA: NEGATIVE
Leukocytes, UA: NEGATIVE
NITRITE UA: NEGATIVE
PH UA: 5.5 (ref 5.0–8.0)
PROTEIN UA: NEGATIVE
RBC UA: NEGATIVE
UROBILINOGEN UA: 0.2 U/dL

## 2018-06-08 LAB — LIPASE: Lipase: 17 U/L (ref 7–60)
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
## 2018-06-08 NOTE — Progress Notes (Signed)
NO pneumonia. Can wait to call until we get stat labs back.

## 2018-06-08 NOTE — Progress Notes (Signed)
Call pt: normal WBC. Normal lipase. Kidney, liver, glucose look great. No known cause for symptoms. Could increase to protonix twice a day and see if that helps. It's likely viral etiology and taking some time to runs its course.

## 2018-06-11 NOTE — Addendum Note (Signed)
Addended by: Donella Stade on: 06/11/2018 12:48 PM   Modules accepted: Orders

## 2018-06-12 ENCOUNTER — Ambulatory Visit (INDEPENDENT_AMBULATORY_CARE_PROVIDER_SITE_OTHER): Payer: Federal, State, Local not specified - PPO | Admitting: Physician Assistant

## 2018-06-12 ENCOUNTER — Other Ambulatory Visit: Payer: Self-pay | Admitting: Physician Assistant

## 2018-06-12 ENCOUNTER — Encounter: Payer: Self-pay | Admitting: Physician Assistant

## 2018-06-12 ENCOUNTER — Ambulatory Visit (INDEPENDENT_AMBULATORY_CARE_PROVIDER_SITE_OTHER): Payer: Federal, State, Local not specified - PPO

## 2018-06-12 VITALS — BP 107/71 | HR 80 | Temp 97.7°F | Ht 66.0 in | Wt 117.0 lb

## 2018-06-12 DIAGNOSIS — R079 Chest pain, unspecified: Secondary | ICD-10-CM | POA: Diagnosis not present

## 2018-06-12 DIAGNOSIS — R0789 Other chest pain: Secondary | ICD-10-CM

## 2018-06-12 DIAGNOSIS — R0602 Shortness of breath: Secondary | ICD-10-CM

## 2018-06-12 DIAGNOSIS — R0902 Hypoxemia: Secondary | ICD-10-CM | POA: Diagnosis not present

## 2018-06-12 DIAGNOSIS — R197 Diarrhea, unspecified: Secondary | ICD-10-CM | POA: Diagnosis not present

## 2018-06-12 LAB — D-DIMER, QUANTITATIVE: D-Dimer, Quant: <0.19 mcg/mL FEU (ref <0.50)

## 2018-06-12 MED ORDER — IPRATROPIUM-ALBUTEROL 0.5-2.5 (3) MG/3ML IN SOLN: 3.0000 mL | Freq: Four times a day (QID) | RESPIRATORY_TRACT | Status: DC

## 2018-06-12 MED ORDER — ALBUTEROL SULFATE HFA 108 (90 BASE) MCG/ACT IN AERS: 2.0000 | INHALATION_SPRAY | Freq: Four times a day (QID) | RESPIRATORY_TRACT | 0 refills | Status: DC | PRN

## 2018-06-12 MED ORDER — PREDNISONE 20 MG PO TABS: ORAL_TABLET | ORAL | 0 refills | Status: DC

## 2018-06-12 MED ORDER — AZITHROMYCIN 250 MG PO TABS: ORAL_TABLET | ORAL | 0 refills | Status: DC

## 2018-06-12 MED ORDER — METHYLPREDNISOLONE SODIUM SUCC 125 MG IJ SOLR
125.0000 mg | Freq: Once | INTRAMUSCULAR | Status: AC
  Administered 2018-06-12: 125 mg via INTRAMUSCULAR

## 2018-06-12 MED ORDER — IPRATROPIUM-ALBUTEROL 0.5-2.5 (3) MG/3ML IN SOLN
3.0000 mL | Freq: Once | RESPIRATORY_TRACT | Status: AC
  Administered 2018-06-12: 3 mL via RESPIRATORY_TRACT

## 2018-06-12 NOTE — Progress Notes (Signed)
D-dimer normal

## 2018-06-12 NOTE — Progress Notes (Signed)
d 
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
## 2018-06-12 NOTE — Addendum Note (Signed)
Addended by: Dessie Coma on: 06/12/2018 02:41 PM   Modules accepted: Orders

## 2018-06-26 ENCOUNTER — Ambulatory Visit (INDEPENDENT_AMBULATORY_CARE_PROVIDER_SITE_OTHER): Payer: Federal, State, Local not specified - PPO | Admitting: Family Medicine

## 2018-06-26 ENCOUNTER — Encounter: Payer: Self-pay | Admitting: Family Medicine

## 2018-06-26 VITALS — BP 111/69 | HR 76 | Ht 64.96 in | Wt 124.0 lb

## 2018-06-26 DIAGNOSIS — R498 Other voice and resonance disorders: Secondary | ICD-10-CM | POA: Diagnosis not present

## 2018-06-26 DIAGNOSIS — R258 Other abnormal involuntary movements: Secondary | ICD-10-CM | POA: Diagnosis not present

## 2018-06-26 DIAGNOSIS — M7989 Other specified soft tissue disorders: Secondary | ICD-10-CM

## 2018-06-26 DIAGNOSIS — G90523 Complex regional pain syndrome I of lower limb, bilateral: Secondary | ICD-10-CM

## 2018-06-26 DIAGNOSIS — I8001 Phlebitis and thrombophlebitis of superficial vessels of right lower extremity: Secondary | ICD-10-CM | POA: Diagnosis not present

## 2018-06-26 DIAGNOSIS — R748 Abnormal levels of other serum enzymes: Secondary | ICD-10-CM

## 2018-06-26 DIAGNOSIS — J383 Other diseases of vocal cords: Secondary | ICD-10-CM

## 2018-06-26 DIAGNOSIS — G250 Essential tremor: Secondary | ICD-10-CM

## 2018-06-26 LAB — POCT URINALYSIS DIPSTICK
Bilirubin, UA: NEGATIVE
Blood, UA: NEGATIVE
Glucose, UA: NEGATIVE
Ketones, UA: NEGATIVE
LEUKOCYTES UA: NEGATIVE
NITRITE UA: NEGATIVE
Protein, UA: NEGATIVE
Spec Grav, UA: 1.015 (ref 1.010–1.025)
Urobilinogen, UA: 0.2 E.U./dL
pH, UA: 7.5 (ref 5.0–8.0)
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
## 2018-06-26 NOTE — Assessment & Plan Note (Addendum)
Recommend treatment including anti-inflammatory Aleve or ibuprofen to help thin the blood and reduce inflammation in addition to some mild compression somewhere between 15 to 27 m of water pressure compression stockings.  This will help control swelling as well as help heal the phlebitis. Will check ASO titer. Consider ERythema  Nodosum as well.

## 2018-06-26 NOTE — Assessment & Plan Note (Signed)
unclear etiology at this point.  Will evaluate for liver disease and renal disease.  She had a recent normal thyroid check about 3 months ago.  We will also check for protein loss in the urine.

## 2018-06-27 ENCOUNTER — Other Ambulatory Visit: Payer: Self-pay | Admitting: *Deleted

## 2018-06-27 DIAGNOSIS — R748 Abnormal levels of other serum enzymes: Secondary | ICD-10-CM

## 2018-06-28 ENCOUNTER — Encounter: Payer: Self-pay | Admitting: Neurology

## 2018-06-28 ENCOUNTER — Other Ambulatory Visit: Payer: Self-pay

## 2018-06-28 LAB — ANTISTREPTOLYSIN O TITER: ASO: <50 IU/mL (ref <200)

## 2018-06-28 LAB — CBC WITH DIFFERENTIAL/PLATELET
BASOS ABS: 30 {cells}/uL (ref 0–200)
Basophils Relative: 0.5 %
Eosinophils Absolute: 153 cells/uL (ref 15–500)
Eosinophils Relative: 2.6 %
HCT: 35.4 % (ref 35.0–45.0)
Hemoglobin: 11.8 g/dL (ref 11.7–15.5)
Lymphs Abs: 1428 cells/uL (ref 850–3900)
MCH: 29.4 pg (ref 27.0–33.0)
MCHC: 33.3 g/dL (ref 32.0–36.0)
MCV: 88.1 fL (ref 80.0–100.0)
MPV: 9.8 fL (ref 7.5–12.5)
Monocytes Relative: 7.2 %
Neutro Abs: 3865 cells/uL (ref 1500–7800)
Neutrophils Relative %: 65.5 %
PLATELETS: 305 10*3/uL (ref 140–400)
RBC: 4.02 10*6/uL (ref 3.80–5.10)
RDW: 12.5 % (ref 11.0–15.0)
TOTAL LYMPHOCYTE: 24.2 %
WBC mixed population: 425 cells/uL (ref 200–950)
WBC: 5.9 10*3/uL (ref 3.8–10.8)

## 2018-06-28 LAB — PROTIME-INR
INR: 0.9
Prothrombin Time: 9.8 s (ref 9.0–11.5)

## 2018-06-28 LAB — COMPLETE METABOLIC PANEL WITH GFR
AG Ratio: 1.9 (calc) (ref 1.0–2.5)
ALT: 72 U/L — ABNORMAL HIGH (ref 6–29)
AST: 33 U/L (ref 10–35)
Albumin: 3.8 g/dL (ref 3.6–5.1)
Alkaline phosphatase (APISO): 95 U/L (ref 33–115)
BUN: 8 mg/dL (ref 7–25)
CO2: 32 mmol/L (ref 20–32)
Calcium: 9.1 mg/dL (ref 8.6–10.2)
Chloride: 103 mmol/L (ref 98–110)
Creat: 0.75 mg/dL (ref 0.50–1.10)
GFR, Est African American: 108 mL/min/{1.73_m2} (ref >=60)
GFR, Est Non African American: 94 mL/min/{1.73_m2} (ref >=60)
Globulin: 2 g/dL (calc) (ref 1.9–3.7)
Glucose, Bld: 129 mg/dL — ABNORMAL HIGH (ref 65–99)
POTASSIUM: 4.5 mmol/L (ref 3.5–5.3)
Sodium: 141 mmol/L (ref 135–146)
TOTAL PROTEIN: 5.8 g/dL — AB (ref 6.1–8.1)
Total Bilirubin: 0.3 mg/dL (ref 0.2–1.2)

## 2018-06-28 LAB — ACUTE HEP PANEL AND HEP B SURFACE AB
HEPATITIS C ANTIBODY REFILL$(REFL): NONREACTIVE
Hep A IgM: NONREACTIVE
Hep B C IgM: NONREACTIVE
Hepatitis B Surface Ag: NONREACTIVE
SIGNAL TO CUT-OFF: 0.01 (ref <1.00)

## 2018-06-28 LAB — C-REACTIVE PROTEIN: CRP: 1.8 mg/L (ref <8.0)

## 2018-06-28 LAB — REFLEX TIQ

## 2018-06-28 LAB — SEDIMENTATION RATE: Sed Rate: 2 mm/h (ref 0–20)

## 2018-06-28 MED ORDER — PROCHLORPERAZINE MALEATE 5 MG PO TABS: 5.0000 mg | ORAL_TABLET | Freq: Three times a day (TID) | ORAL | 1 refills | Status: DC | PRN

## 2018-06-28 MED ORDER — SUMATRIPTAN SUCCINATE 50 MG PO TABS: ORAL_TABLET | ORAL | 3 refills | Status: DC

## 2018-06-28 NOTE — Telephone Encounter (Signed)
Requesting RF on Imitrex and Compazine. RXs pended. Need to go to new pharmacy.

## 2018-06-28 NOTE — Telephone Encounter (Signed)
Pt advised.

## 2018-06-29 ENCOUNTER — Telehealth: Payer: Self-pay | Admitting: Family Medicine

## 2018-06-29 ENCOUNTER — Other Ambulatory Visit: Payer: Self-pay

## 2018-06-29 DIAGNOSIS — J383 Other diseases of vocal cords: Secondary | ICD-10-CM | POA: Insufficient documentation

## 2018-06-29 MED ORDER — PROCHLORPERAZINE MALEATE 5 MG PO TABS: 5.0000 mg | ORAL_TABLET | Freq: Three times a day (TID) | ORAL | 1 refills | Status: DC

## 2018-06-29 NOTE — Assessment & Plan Note (Signed)
Followed at [facility].  She will have evaluation with speech therapy

## 2018-06-29 NOTE — Telephone Encounter (Signed)
HI [name],  Can you see if can get [name] in with Dr. Jannifer Franklin at [facility] for evaluation of muscle spams, weakness, tremor worsening.  Thank you.  Let me know if he is way booked out.

## 2018-06-29 NOTE — Assessment & Plan Note (Signed)
Tremor getting worse. Jaw tremor can still be assoc with essential tremor and that seems to be getting worse. Refer to Neuro.

## 2018-06-29 NOTE — Telephone Encounter (Signed)
Jenny Reichmann is working on this.

## 2018-07-04 ENCOUNTER — Encounter: Payer: Self-pay | Admitting: Family Medicine

## 2018-07-04 ENCOUNTER — Ambulatory Visit (INDEPENDENT_AMBULATORY_CARE_PROVIDER_SITE_OTHER): Payer: Federal, State, Local not specified - PPO | Admitting: Family Medicine

## 2018-07-04 VITALS — BP 121/71 | HR 89 | Ht 65.0 in | Wt 124.0 lb

## 2018-07-04 DIAGNOSIS — L600 Ingrowing nail: Secondary | ICD-10-CM

## 2018-07-04 MED ORDER — BUPROPION HCL ER (XL) 150 MG PO TB24: 150.0000 mg | ORAL_TABLET | ORAL | 3 refills | Status: DC
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
## 2018-07-04 NOTE — Addendum Note (Signed)
Addended by: Teddy Spike on: 07/04/2018 03:55 PM   Modules accepted: Orders

## 2018-07-04 NOTE — Patient Instructions (Signed)
Keep covered till tomorrow morning.  Okay to ice and elevate as needed until then.  Okay to use Aleve or ibuprofen. Tomorrow okay to get wet in the shower.  Do not scrub at the area just clean with fingertip with a little bit of antibacterial soap, rinse, and then pat dry.  Apply a dab of Vaseline and cover with Band-Aid.  After couple days it should quit draining and you should need to keep it covered but continue to apply the Vaseline twice a day for 2 weeks.

## 2018-07-04 NOTE — Addendum Note (Signed)
Addended by: Teddy Spike on: 07/04/2018 04:09 PM   Modules accepted: Orders

## 2018-07-11 ENCOUNTER — Other Ambulatory Visit: Payer: Self-pay | Admitting: Family Medicine

## 2018-07-11 MED ORDER — OMEPRAZOLE 40 MG PO CPDR: 40.0000 mg | DELAYED_RELEASE_CAPSULE | Freq: Every morning | ORAL | 3 refills | Status: DC

## 2018-07-11 MED ORDER — BUPROPION HCL ER (XL) 150 MG PO TB24: 150.0000 mg | ORAL_TABLET | ORAL | 3 refills | Status: DC

## 2018-07-11 MED ORDER — ALPRAZOLAM 2 MG PO TABS: 2.0000 mg | ORAL_TABLET | Freq: Every evening | ORAL | 2 refills | Status: DC | PRN

## 2018-07-20 DIAGNOSIS — Z79899 Other long term (current) drug therapy: Secondary | ICD-10-CM | POA: Diagnosis not present

## 2018-07-20 DIAGNOSIS — M792 Neuralgia and neuritis, unspecified: Secondary | ICD-10-CM | POA: Diagnosis not present

## 2018-07-20 DIAGNOSIS — G894 Chronic pain syndrome: Secondary | ICD-10-CM | POA: Diagnosis not present

## 2018-07-20 DIAGNOSIS — G90522 Complex regional pain syndrome I of left lower limb: Secondary | ICD-10-CM | POA: Diagnosis not present

## 2018-07-20 DIAGNOSIS — Z5181 Encounter for therapeutic drug level monitoring: Secondary | ICD-10-CM | POA: Diagnosis not present

## 2018-07-20 DIAGNOSIS — M21612 Bunion of left foot: Secondary | ICD-10-CM | POA: Diagnosis not present

## 2018-08-03 ENCOUNTER — Other Ambulatory Visit: Payer: Self-pay

## 2018-08-03 LAB — CULT, FUNGUS, SKIN,HAIR,NAIL W/KOH
MICRO NUMBER:: 91484061
SMEAR:: NONE SEEN
SPECIMEN QUALITY: ADEQUATE

## 2018-08-03 MED ORDER — VENLAFAXINE HCL ER 150 MG PO CP24: 150.0000 mg | ORAL_CAPSULE | Freq: Every day | ORAL | 1 refills | Status: DC

## 2018-08-09 ENCOUNTER — Other Ambulatory Visit: Payer: Self-pay | Admitting: Family Medicine

## 2018-08-09 DIAGNOSIS — R69 Illness, unspecified: Principal | ICD-10-CM

## 2018-08-09 DIAGNOSIS — J111 Influenza due to unidentified influenza virus with other respiratory manifestations: Secondary | ICD-10-CM

## 2018-08-09 MED ORDER — OSELTAMIVIR PHOSPHATE 75 MG PO CAPS: 75.0000 mg | ORAL_CAPSULE | Freq: Two times a day (BID) | ORAL | 0 refills | Status: DC

## 2018-08-09 MED ORDER — PROMETHAZINE HCL 25 MG PO TABS: 25.0000 mg | ORAL_TABLET | Freq: Four times a day (QID) | ORAL | 0 refills | Status: DC | PRN

## 2018-08-09 NOTE — Telephone Encounter (Signed)
Pt complains of fever (102.4), body aches, chills, headache, and nausea. Questions if tamiflu can be sent in. Symptoms began about 0100 today. Rx pended to pharmacy of choice.

## 2018-08-09 NOTE — Telephone Encounter (Signed)
Pt advised of sent Rx's.

## 2018-08-13 ENCOUNTER — Telehealth: Payer: Self-pay | Admitting: Family Medicine

## 2018-08-13 NOTE — Telephone Encounter (Signed)
Pt still running fever (goes from 99-102), fatigue, weakness, and chills. Completed Tamiflu Rx today. Per PCP review, she should come in for office eval. Due to transportation issues(she feels to weak to drive) she is going to try and find a ride for OV tomorrow or complete and E-Visit.

## 2018-08-14 DIAGNOSIS — J209 Acute bronchitis, unspecified: Secondary | ICD-10-CM | POA: Diagnosis not present

## 2018-08-14 DIAGNOSIS — J069 Acute upper respiratory infection, unspecified: Secondary | ICD-10-CM | POA: Diagnosis not present

## 2018-08-14 NOTE — Telephone Encounter (Signed)
Update - Pt was evaluated today at local Urgent Care and it was determined she may have a second flu as well as bronchitis. She is being treated with antibiotics.

## 2018-08-21 ENCOUNTER — Other Ambulatory Visit: Payer: Self-pay

## 2018-08-21 DIAGNOSIS — J111 Influenza due to unidentified influenza virus with other respiratory manifestations: Secondary | ICD-10-CM

## 2018-08-21 DIAGNOSIS — R69 Illness, unspecified: Principal | ICD-10-CM

## 2018-08-21 MED ORDER — PROCHLORPERAZINE MALEATE 5 MG PO TABS: 5.0000 mg | ORAL_TABLET | Freq: Three times a day (TID) | ORAL | 1 refills | Status: DC

## 2018-08-22 ENCOUNTER — Other Ambulatory Visit: Payer: Self-pay | Admitting: *Deleted

## 2018-08-22 DIAGNOSIS — N760 Acute vaginitis: Secondary | ICD-10-CM | POA: Diagnosis not present

## 2018-08-22 LAB — UNLABELED

## 2018-08-23 ENCOUNTER — Other Ambulatory Visit: Payer: Self-pay | Admitting: *Deleted

## 2018-08-23 ENCOUNTER — Ambulatory Visit (INDEPENDENT_AMBULATORY_CARE_PROVIDER_SITE_OTHER): Payer: Federal, State, Local not specified - PPO | Admitting: Sports Medicine

## 2018-08-23 DIAGNOSIS — M722 Plantar fascial fibromatosis: Secondary | ICD-10-CM | POA: Diagnosis not present

## 2018-08-23 LAB — PAT ID TIQ DOC: Test Affected: 3963

## 2018-08-23 LAB — SPECIMEN ID NOTIFICATION MISSING 2ND ID

## 2018-08-23 LAB — WET PREP FOR TRICH, YEAST, CLUE
MICRO NUMBER:: 121323
Specimen Quality: ADEQUATE

## 2018-08-23 MED ORDER — METRONIDAZOLE 500 MG PO TABS: 500.0000 mg | ORAL_TABLET | Freq: Two times a day (BID) | ORAL | 0 refills | Status: DC

## 2018-08-23 NOTE — Assessment & Plan Note (Addendum)
Previous injection was in September 2019. Repeat injection today. Continue air heel brace, stretches. She will come back for custom molded orthotics. We will also consider a night splint at that time. She does tell me she has a leg length discrepancy so we measure her leg lengths.

## 2018-08-23 NOTE — Progress Notes (Signed)
   Procedure: Real-time Ultrasound Guided Injection of left plantar fascia origin Device: GE Logiq E  Verbal informed consent obtained.  Time-out conducted.  Noted no overlying erythema, induration, or other signs of local infection.  Skin prepped in a sterile fashion.  Local anesthesia: Topical Ethyl chloride.  With sterile technique and under real time ultrasound guidance: 25-gauge needle advanced just deep to the plantar fascia origin of the calcaneus, I then injected 1 cc Kenalog 40, 1 cc lidocaine, 1 cc bupivacaine. Completed without difficulty  Pain immediately resolved suggesting accurate placement of the medication.  Advised to call if fevers/chills, erythema, induration, drainage, or persistent bleeding.  Images permanently stored and available for review in the ultrasound unit.  Impression: Technically successful ultrasound guided injection.

## 2018-08-30 ENCOUNTER — Encounter: Payer: Self-pay | Admitting: Sports Medicine

## 2018-09-05 ENCOUNTER — Other Ambulatory Visit: Payer: Self-pay | Admitting: Physician Assistant

## 2018-09-05 DIAGNOSIS — I8393 Asymptomatic varicose veins of bilateral lower extremities: Secondary | ICD-10-CM

## 2018-09-06 ENCOUNTER — Encounter: Payer: Self-pay | Admitting: Neurology

## 2018-09-06 ENCOUNTER — Ambulatory Visit: Payer: Federal, State, Local not specified - PPO | Admitting: Neurology

## 2018-09-06 VITALS — BP 114/75 | HR 68 | Ht 65.0 in | Wt 127.0 lb

## 2018-09-06 DIAGNOSIS — G43009 Migraine without aura, not intractable, without status migrainosus: Secondary | ICD-10-CM

## 2018-09-06 DIAGNOSIS — G250 Essential tremor: Secondary | ICD-10-CM

## 2018-09-06 MED ORDER — DICLOFENAC POTASSIUM 50 MG PO TABS: 50.0000 mg | ORAL_TABLET | Freq: Three times a day (TID) | ORAL | 2 refills | Status: DC | PRN

## 2018-09-06 MED ORDER — PROPRANOLOL HCL 20 MG PO TABS: 20.0000 mg | ORAL_TABLET | Freq: Two times a day (BID) | ORAL | 3 refills | Status: DC
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
## 2018-09-07 ENCOUNTER — Ambulatory Visit: Payer: Self-pay | Admitting: Neurology

## 2018-09-13 ENCOUNTER — Other Ambulatory Visit: Payer: Self-pay

## 2018-09-13 MED ORDER — SUMATRIPTAN SUCCINATE 50 MG PO TABS: ORAL_TABLET | ORAL | 3 refills | Status: DC

## 2018-09-28 ENCOUNTER — Telehealth: Payer: Self-pay | Admitting: Family Medicine

## 2018-09-28 DIAGNOSIS — R251 Tremor, unspecified: Secondary | ICD-10-CM

## 2018-09-28 DIAGNOSIS — R413 Other amnesia: Secondary | ICD-10-CM

## 2018-09-28 DIAGNOSIS — G894 Chronic pain syndrome: Secondary | ICD-10-CM | POA: Diagnosis not present

## 2018-09-28 DIAGNOSIS — M792 Neuralgia and neuritis, unspecified: Secondary | ICD-10-CM | POA: Diagnosis not present

## 2018-09-28 DIAGNOSIS — G90522 Complex regional pain syndrome I of left lower limb: Secondary | ICD-10-CM | POA: Diagnosis not present

## 2018-09-28 DIAGNOSIS — M21612 Bunion of left foot: Secondary | ICD-10-CM | POA: Diagnosis not present

## 2018-09-28 NOTE — Telephone Encounter (Signed)
Patient did see neurology on February 13.  It was advised that she stop her Adderall.  She has been off of it for almost 3 weeks and says she has not noticed any significant improvement in her tremor.  She is also cut back significantly on her caffeine intake and again has not noticed any significant improvement in her tremor.  She still struggling with memory issues as well.  Okay to restart Adderall as she is noticed a big difference in her energy levels off of the medication.  Placing referral for movement disorder clinic at [facility] in addition to neuropsychiatric evaluation for her memory deficits.

## 2018-10-01 ENCOUNTER — Other Ambulatory Visit: Payer: Self-pay

## 2018-10-01 DIAGNOSIS — I8393 Asymptomatic varicose veins of bilateral lower extremities: Secondary | ICD-10-CM

## 2018-10-03 ENCOUNTER — Encounter: Payer: Self-pay | Admitting: Family Medicine

## 2018-10-03 ENCOUNTER — Ambulatory Visit (INDEPENDENT_AMBULATORY_CARE_PROVIDER_SITE_OTHER): Payer: Federal, State, Local not specified - PPO | Admitting: Family Medicine

## 2018-10-03 VITALS — BP 121/72 | HR 75 | Ht 65.0 in | Wt 127.0 lb

## 2018-10-03 DIAGNOSIS — R5383 Other fatigue: Secondary | ICD-10-CM

## 2018-10-03 DIAGNOSIS — R251 Tremor, unspecified: Secondary | ICD-10-CM | POA: Diagnosis not present

## 2018-10-03 DIAGNOSIS — R21 Rash and other nonspecific skin eruption: Secondary | ICD-10-CM

## 2018-10-03 DIAGNOSIS — R0683 Snoring: Secondary | ICD-10-CM

## 2018-10-03 DIAGNOSIS — G4719 Other hypersomnia: Secondary | ICD-10-CM | POA: Diagnosis not present

## 2018-10-03 DIAGNOSIS — F339 Major depressive disorder, recurrent, unspecified: Secondary | ICD-10-CM

## 2018-10-03 MED ORDER — TRIAMCINOLONE ACETONIDE 0.1 % EX CREA: 1.0000 "application " | TOPICAL_CREAM | Freq: Every evening | CUTANEOUS | 0 refills | Status: DC | PRN
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
## 2018-10-04 ENCOUNTER — Other Ambulatory Visit: Payer: Self-pay

## 2018-10-04 ENCOUNTER — Ambulatory Visit (HOSPITAL_COMMUNITY)
Admission: RE | Admit: 2018-10-04 | Discharge: 2018-10-04 | Disposition: A | Discharge status: Home / Self Care | Payer: Federal, State, Local not specified - PPO | Source: Ambulatory Visit | Attending: Family | Admitting: Family

## 2018-10-04 ENCOUNTER — Ambulatory Visit (INDEPENDENT_AMBULATORY_CARE_PROVIDER_SITE_OTHER): Payer: Federal, State, Local not specified - PPO | Admitting: Vascular Surgery

## 2018-10-04 ENCOUNTER — Encounter: Payer: Self-pay | Admitting: Vascular Surgery

## 2018-10-04 VITALS — BP 125/82 | HR 69 | Resp 18 | Ht 65.0 in | Wt 126.0 lb

## 2018-10-04 DIAGNOSIS — I8393 Asymptomatic varicose veins of bilateral lower extremities: Secondary | ICD-10-CM
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
## 2018-10-05 LAB — COMPLETE METABOLIC PANEL WITH GFR
AG RATIO: 2 (calc) (ref 1.0–2.5)
ALT: 14 U/L (ref 6–29)
AST: 14 U/L (ref 10–35)
Albumin: 4.3 g/dL (ref 3.6–5.1)
Alkaline phosphatase (APISO): 85 U/L (ref 31–125)
BILIRUBIN TOTAL: 0.3 mg/dL (ref 0.2–1.2)
BUN: 9 mg/dL (ref 7–25)
CO2: 29 mmol/L (ref 20–32)
Calcium: 9.2 mg/dL (ref 8.6–10.2)
Chloride: 101 mmol/L (ref 98–110)
Creat: 0.69 mg/dL (ref 0.50–1.10)
GFR, Est African American: 118 mL/min/{1.73_m2} (ref >=60)
GFR, Est Non African American: 102 mL/min/{1.73_m2} (ref >=60)
Globulin: 2.1 g/dL (calc) (ref 1.9–3.7)
Glucose, Bld: 76 mg/dL (ref 65–99)
Potassium: 3.8 mmol/L (ref 3.5–5.3)
Sodium: 138 mmol/L (ref 135–146)
Total Protein: 6.4 g/dL (ref 6.1–8.1)

## 2018-10-05 LAB — CORTISOL: Cortisol, Plasma: 10.6 ug/dL

## 2018-10-05 LAB — URINALYSIS, ROUTINE W REFLEX MICROSCOPIC
Bilirubin Urine: NEGATIVE
Glucose, UA: NEGATIVE
HGB URINE DIPSTICK: NEGATIVE
Ketones, ur: NEGATIVE
Leukocytes,Ua: NEGATIVE
NITRITE: NEGATIVE
Protein, ur: NEGATIVE
Specific Gravity, Urine: 1.005 (ref 1.001–1.03)
pH: 6.5 (ref 5.0–8.0)

## 2018-10-05 LAB — CBC WITH DIFFERENTIAL/PLATELET
Absolute Monocytes: 601 cells/uL (ref 200–950)
Basophils Absolute: 33 cells/uL (ref 0–200)
Basophils Relative: 0.5 %
Eosinophils Absolute: 40 cells/uL (ref 15–500)
Eosinophils Relative: 0.6 %
HCT: 36.2 % (ref 35.0–45.0)
Hemoglobin: 12.4 g/dL (ref 11.7–15.5)
Lymphs Abs: 1577 cells/uL (ref 850–3900)
MCH: 30 pg (ref 27.0–33.0)
MCHC: 34.3 g/dL (ref 32.0–36.0)
MCV: 87.4 fL (ref 80.0–100.0)
MPV: 9.5 fL (ref 7.5–12.5)
Monocytes Relative: 9.1 %
Neutro Abs: 4349 cells/uL (ref 1500–7800)
Neutrophils Relative %: 65.9 %
Platelets: 321 10*3/uL (ref 140–400)
RBC: 4.14 10*6/uL (ref 3.80–5.10)
RDW: 12.2 % (ref 11.0–15.0)
Total Lymphocyte: 23.9 %
WBC: 6.6 10*3/uL (ref 3.8–10.8)

## 2018-10-05 LAB — SEDIMENTATION RATE: Sed Rate: 2 mm/h (ref 0–20)

## 2018-10-05 LAB — PROTEIN ELECTROPHORESIS, SERUM
Albumin ELP: 4.2 g/dL (ref 3.8–4.8)
Alpha 1: 0.2 g/dL (ref 0.2–0.3)
Alpha 2: 0.7 g/dL (ref 0.5–0.9)
Beta 2: 0.2 g/dL (ref 0.2–0.5)
Beta Globulin: 0.3 g/dL — ABNORMAL LOW (ref 0.4–0.6)
Gamma Globulin: 0.8 g/dL (ref 0.8–1.7)
Total Protein: 6.5 g/dL (ref 6.1–8.1)

## 2018-10-05 LAB — ACTH: C206 ACTH: 8 pg/mL (ref 6–50)

## 2018-10-05 LAB — FOLATE: Folate: 3.7 ng/mL — ABNORMAL LOW

## 2018-10-05 LAB — FERRITIN: Ferritin: 21 ng/mL (ref 16–232)

## 2018-10-05 LAB — VITAMIN D 25 HYDROXY (VIT D DEFICIENCY, FRACTURES): Vit D, 25-Hydroxy: 15 ng/mL — ABNORMAL LOW (ref 30–100)

## 2018-10-05 LAB — VITAMIN B12: Vitamin B-12: 246 pg/mL (ref 200–1100)

## 2018-10-05 LAB — TSH: TSH: 1.41 mIU/L

## 2018-10-08 ENCOUNTER — Telehealth: Payer: Self-pay | Admitting: Family Medicine

## 2018-10-08 MED ORDER — LEVOMILNACIPRAN HCL ER 20 MG PO CP24: ORAL_CAPSULE | ORAL | 0 refills | Status: DC

## 2018-10-08 NOTE — Telephone Encounter (Signed)
Called her insurance to verify they will cover [name] it is tier 3 so we will also need to start a tier exemption.  We will start with 20 mg for 2 days and then go up to 40 mg.  She previously was up to 80 mg so depending on how she does after a week or 2 we can always increase the dose if needed.

## 2018-10-09 NOTE — Telephone Encounter (Signed)
I have filled out form and will have PCP sign and then fax to insurance. Waiting on determination.

## 2018-10-11 ENCOUNTER — Other Ambulatory Visit: Payer: Self-pay | Admitting: *Deleted

## 2018-10-11 MED ORDER — ALPRAZOLAM 2 MG PO TABS: 2.0000 mg | ORAL_TABLET | Freq: Every evening | ORAL | 2 refills | Status: DC | PRN

## 2018-10-18 ENCOUNTER — Encounter: Payer: Self-pay | Admitting: Psychology

## 2018-10-22 NOTE — Telephone Encounter (Signed)
Patient said she received a denial letter on Saturday and I have not received anything. Do you have any other recommendations? Please advise.

## 2018-10-23 IMAGING — DX DG HAND COMPLETE 3+V*R*
3 series · 3 of 3 positions shown · non-contrast
Comparison: None.

CLINICAL DATA: Slipped on ice this am and fell on rt hand/wrist.
Pain in radial wrist and 1st cmc jt w/knot.

EXAM:
RIGHT HAND - COMPLETE 3+ VIEW

[hand pa]
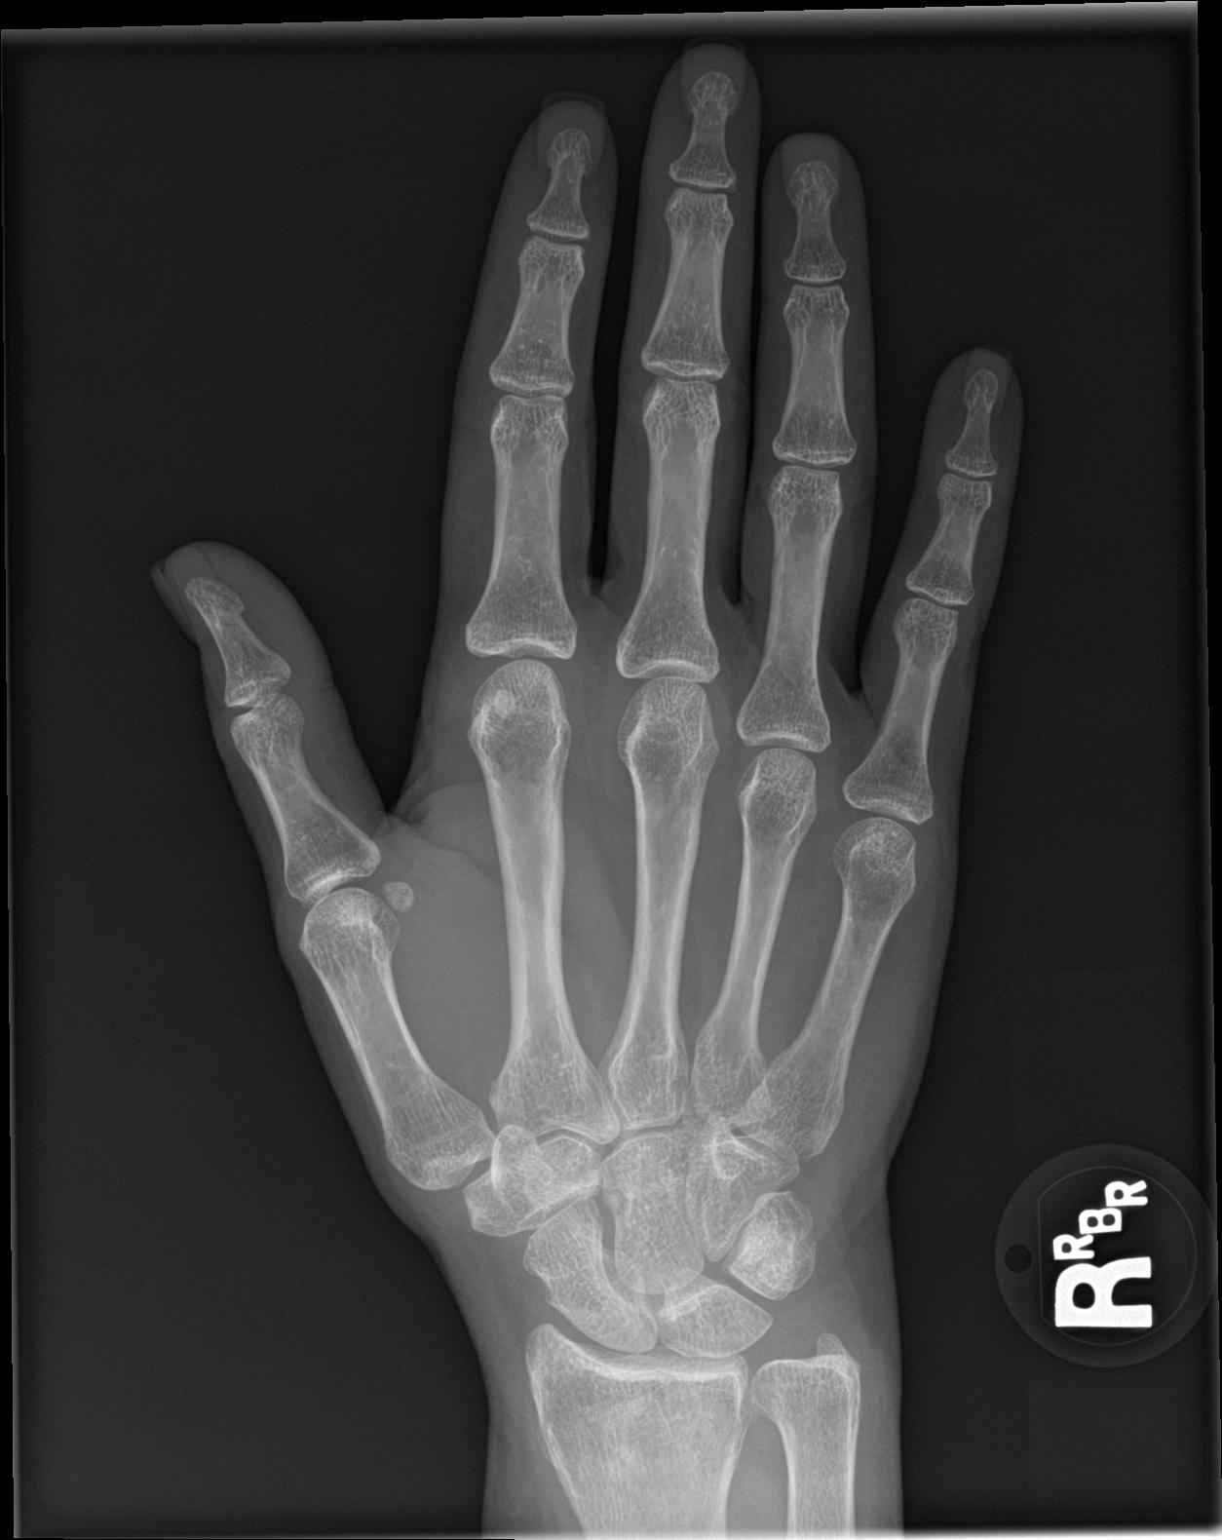

[hand obl]
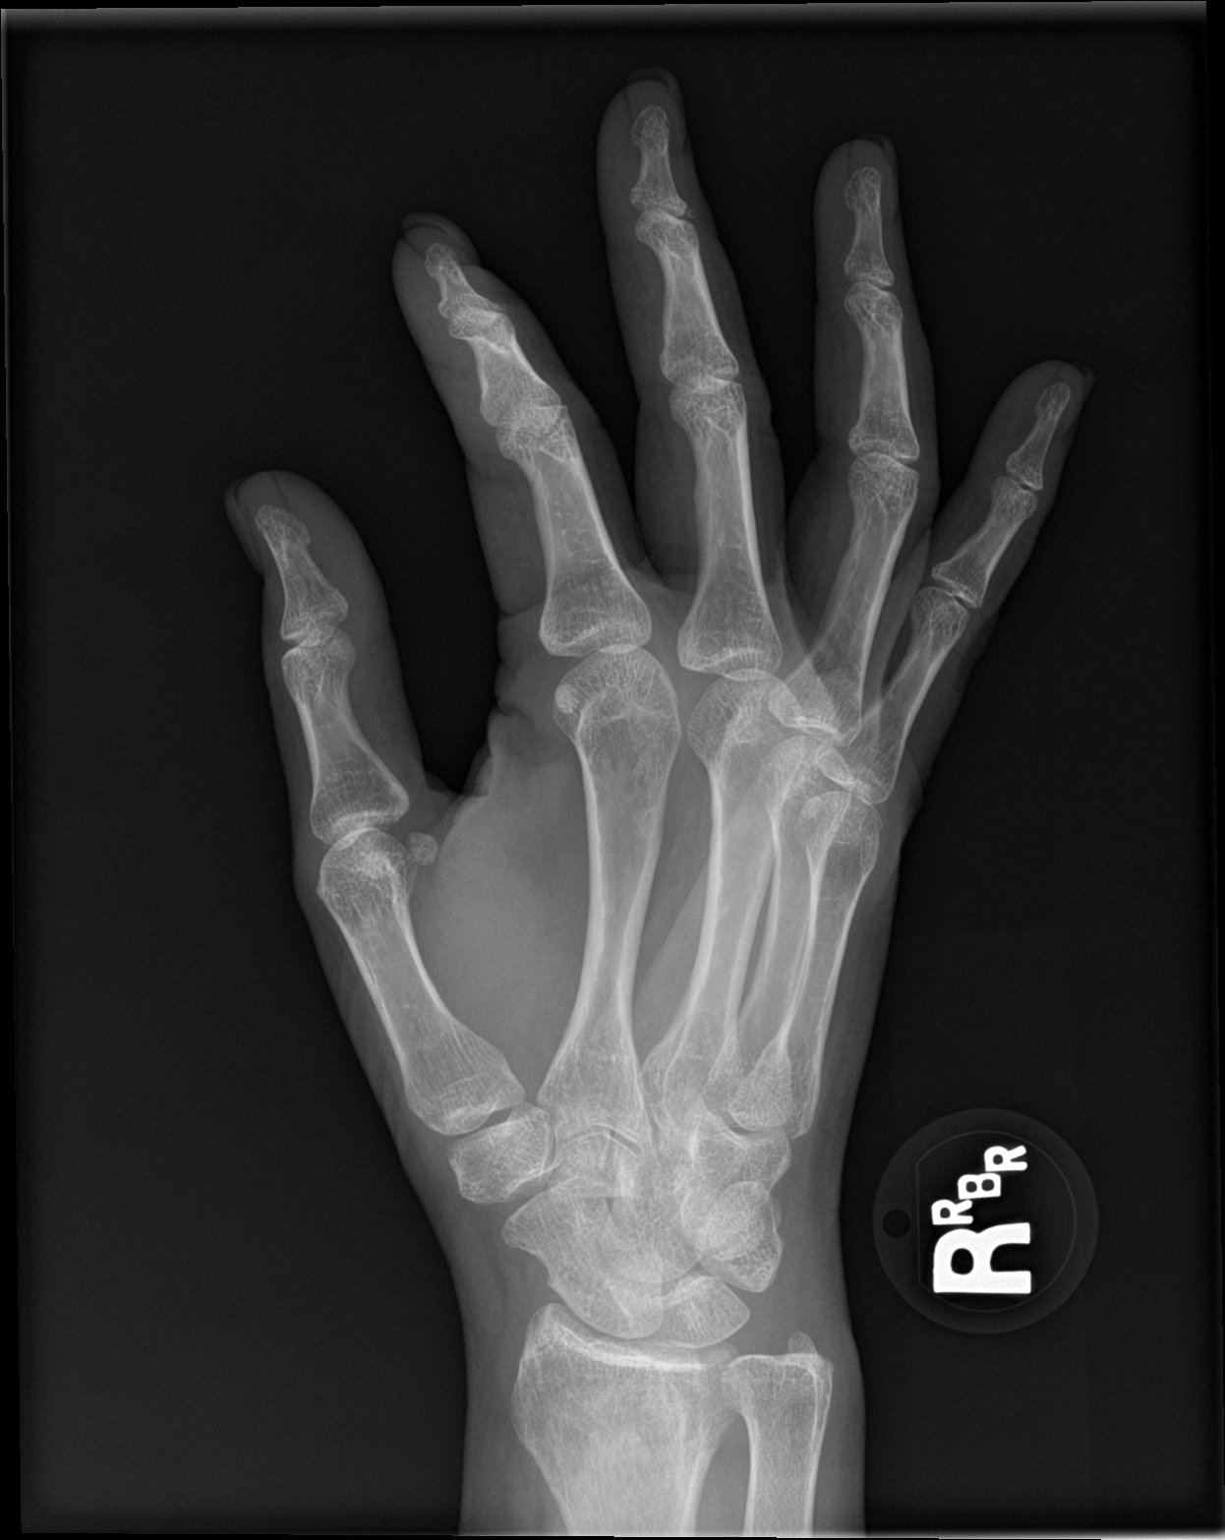

[hand lat]
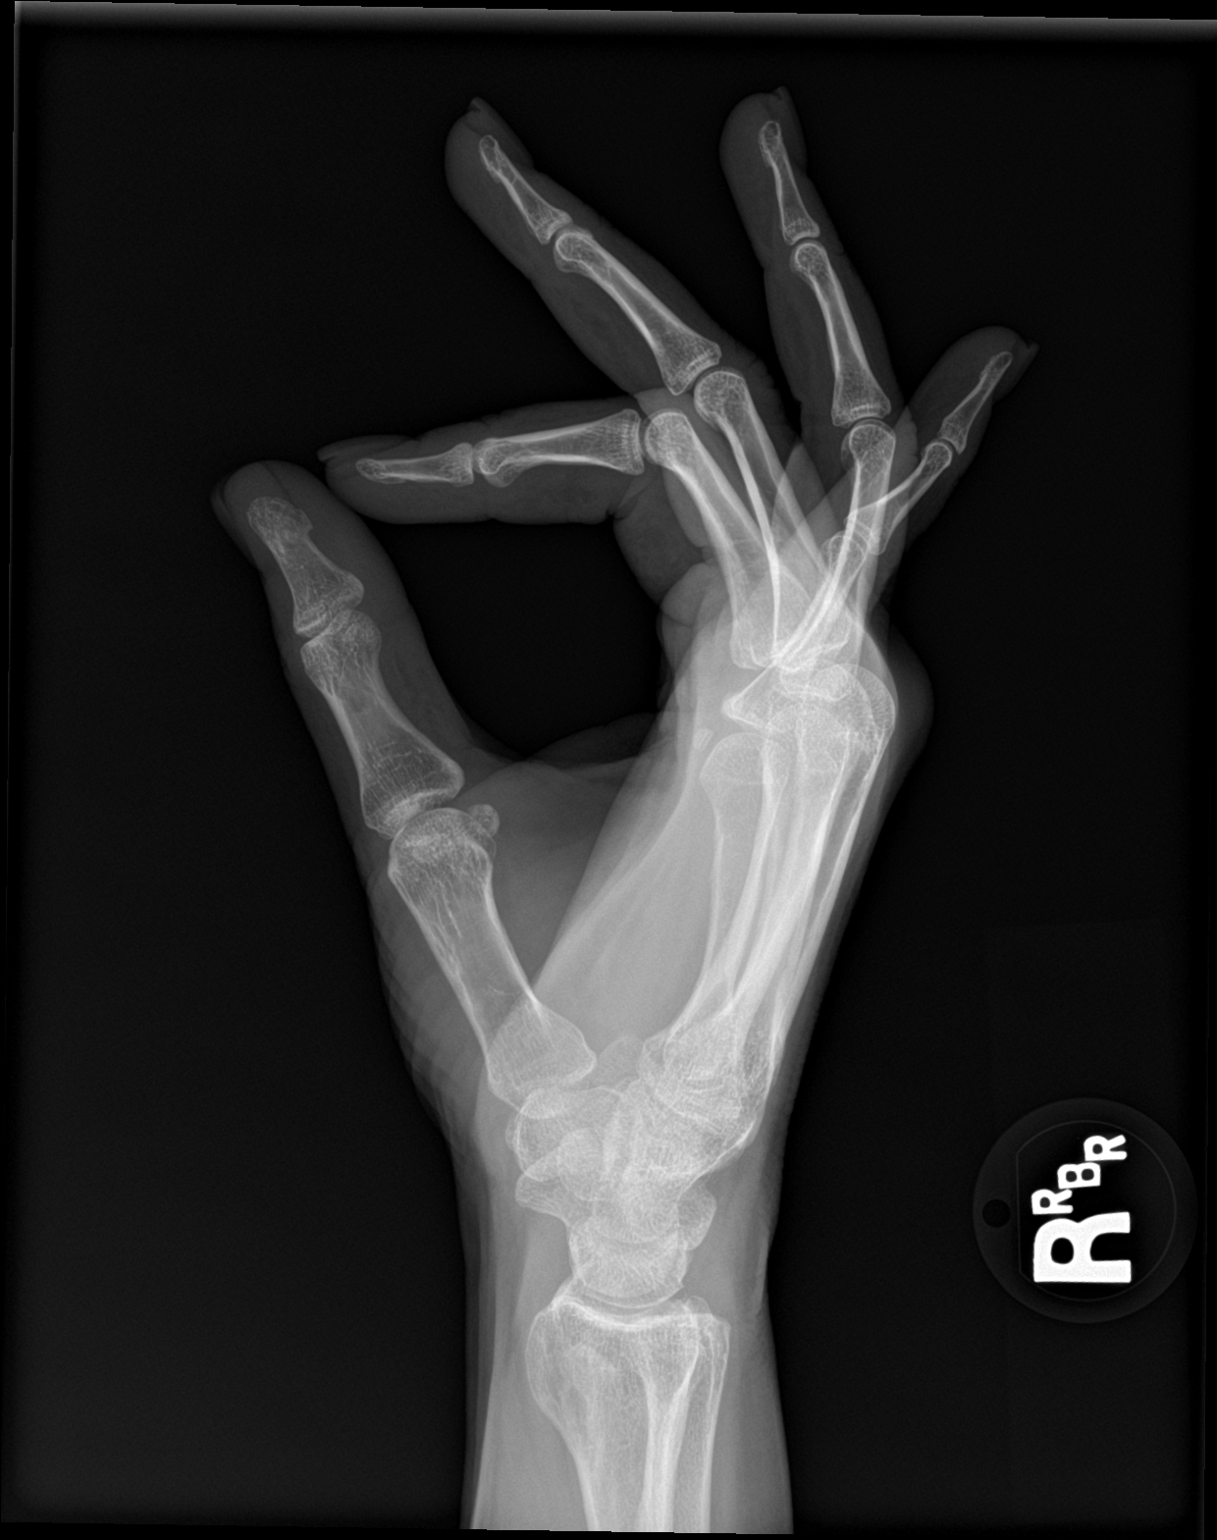

[3 of 3 positions shown; findings below may reference images not displayed]

FINDINGS: There is no evidence of fracture or dislocation. There is no
evidence of arthropathy or other focal bone abnormality. Soft
tissues are unremarkable.
IMPRESSION: Negative.

## 2018-10-23 NOTE — Telephone Encounter (Signed)
I have called and spoke with insurance and found out this is not on the formulary plan at this time per [place] at [facility]. I was given the generic Pristiq (Desvenlafaxine). I do not know if the patient would like to try this. Please. This is a cheaper alternative.

## 2018-11-12 ENCOUNTER — Telehealth: Payer: Self-pay | Admitting: *Deleted

## 2018-11-12 DIAGNOSIS — R0602 Shortness of breath: Secondary | ICD-10-CM

## 2018-11-12 DIAGNOSIS — G8929 Other chronic pain: Secondary | ICD-10-CM

## 2018-11-12 DIAGNOSIS — M542 Cervicalgia: Secondary | ICD-10-CM

## 2018-11-12 DIAGNOSIS — R0789 Other chest pain: Secondary | ICD-10-CM

## 2018-11-12 DIAGNOSIS — R0902 Hypoxemia: Secondary | ICD-10-CM

## 2018-11-12 MED ORDER — ACYCLOVIR 400 MG PO TABS: ORAL_TABLET | ORAL | 2 refills | Status: DC

## 2018-11-12 MED ORDER — PREDNISONE 10 MG PO TABS: 50.0000 mg | ORAL_TABLET | Freq: Every day | ORAL | 1 refills | Status: DC

## 2018-11-12 NOTE — Telephone Encounter (Signed)
ERR

## 2018-11-20 ENCOUNTER — Other Ambulatory Visit: Payer: Self-pay

## 2018-11-20 ENCOUNTER — Encounter: Payer: Self-pay | Admitting: Sports Medicine

## 2018-11-20 ENCOUNTER — Ambulatory Visit (INDEPENDENT_AMBULATORY_CARE_PROVIDER_SITE_OTHER): Payer: Federal, State, Local not specified - PPO | Admitting: Sports Medicine

## 2018-11-20 DIAGNOSIS — M47812 Spondylosis without myelopathy or radiculopathy, cervical region: Secondary | ICD-10-CM

## 2018-11-20 MED ORDER — CYCLOBENZAPRINE HCL 10 MG PO TABS: ORAL_TABLET | ORAL | 0 refills | Status: DC

## 2018-11-20 MED ORDER — PREDNISONE 50 MG PO TABS: ORAL_TABLET | ORAL | 0 refills | Status: DC
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
## 2018-11-20 NOTE — Assessment & Plan Note (Signed)
History of C5-C7 ACDF, increasing neck pain, adding prednisone, Flexeril, rehab exercises. Cervical spine x-rays, return to see me in 2 weeks, we will do a physical exam and consider CT if no better. Unable to do MRIs, she does have a spinal cord stimulator.

## 2018-11-27 ENCOUNTER — Ambulatory Visit (INDEPENDENT_AMBULATORY_CARE_PROVIDER_SITE_OTHER): Payer: Federal, State, Local not specified - PPO | Admitting: Sports Medicine

## 2018-11-27 DIAGNOSIS — M47812 Spondylosis without myelopathy or radiculopathy, cervical region: Secondary | ICD-10-CM | POA: Diagnosis not present

## 2018-11-27 MED ORDER — PREDNISONE 10 MG (48) PO TBPK: ORAL_TABLET | Freq: Every day | ORAL | 0 refills | Status: DC

## 2018-11-27 NOTE — Assessment & Plan Note (Signed)
History of C5-C7 ACDF, she had a partial improvement with prednisone and Flexeril, rehab. Never got the x-rays. Adding an additional 12-day taper of prednisone considering how well she did, if insufficient improvement after the taper of prednisone we will go ahead and proceed with cervical spine CT, she is unable to do MRIs as she does have a spinal cord stimulator.
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
## 2018-11-30 ENCOUNTER — Telehealth: Payer: Self-pay | Admitting: Family Medicine

## 2018-11-30 MED ORDER — GUAIFENESIN-CODEINE 100-10 MG/5ML PO SYRP: 5.0000 mL | ORAL_SOLUTION | Freq: Every evening | ORAL | 0 refills | Status: DC | PRN

## 2018-11-30 MED ORDER — AZITHROMYCIN 250 MG PO TABS: ORAL_TABLET | ORAL | 0 refills | Status: DC

## 2018-11-30 NOTE — Telephone Encounter (Signed)
Got notice her COVID test was negative.  She still has cough. She describes it as "Whoop" to it.  Pain under the left breast. Has been SOB.  No fever.  She says her sister has it too who lives with it.  Sick x 6 day. Will send over zpack.

## 2018-12-05 ENCOUNTER — Other Ambulatory Visit: Payer: Self-pay | Admitting: *Deleted

## 2018-12-05 DIAGNOSIS — G8929 Other chronic pain: Secondary | ICD-10-CM

## 2018-12-05 MED ORDER — ACYCLOVIR 400 MG PO TABS: ORAL_TABLET | ORAL | 2 refills | Status: DC

## 2018-12-05 MED ORDER — PREDNISONE 10 MG PO TABS: 50.0000 mg | ORAL_TABLET | Freq: Every day | ORAL | 0 refills | Status: DC | PRN

## 2018-12-05 MED ORDER — AMPHETAMINE-DEXTROAMPHETAMINE 20 MG PO TABS: 20.0000 mg | ORAL_TABLET | Freq: Two times a day (BID) | ORAL | 0 refills | Status: DC

## 2018-12-05 MED ORDER — PROCHLORPERAZINE MALEATE 5 MG PO TABS: 5.0000 mg | ORAL_TABLET | Freq: Three times a day (TID) | ORAL | 0 refills | Status: DC

## 2018-12-05 NOTE — Telephone Encounter (Signed)
Pt will be losing her health insurance and is asking for 90 day refills on medications.Maryruth Eve, Lahoma Crocker, CMA

## 2018-12-07 ENCOUNTER — Telehealth: Payer: Self-pay | Admitting: Family Medicine

## 2018-12-07 NOTE — Telephone Encounter (Signed)
Approved today (Adderall) Your PA request has been approved. Additional information will be provided in the approval communication. (Message 1145). Pharmacy aware. Patient is aware.

## 2018-12-21 DIAGNOSIS — M792 Neuralgia and neuritis, unspecified: Secondary | ICD-10-CM | POA: Diagnosis not present

## 2018-12-21 DIAGNOSIS — G894 Chronic pain syndrome: Secondary | ICD-10-CM | POA: Diagnosis not present

## 2018-12-21 DIAGNOSIS — M21612 Bunion of left foot: Secondary | ICD-10-CM | POA: Diagnosis not present

## 2018-12-27 ENCOUNTER — Other Ambulatory Visit: Payer: Self-pay | Admitting: *Deleted

## 2018-12-27 MED ORDER — VENLAFAXINE HCL ER 150 MG PO CP24: 150.0000 mg | ORAL_CAPSULE | Freq: Every day | ORAL | 3 refills | Status: DC

## 2019-01-01 ENCOUNTER — Telehealth: Payer: Self-pay

## 2019-01-01 NOTE — Telephone Encounter (Signed)
Unable to get in contact with the patient to r/s her appt with Sarah and to convert it into a doxy.me visit. I left a voicemail asking her to return my call. Office number was provided.    If patient calls back please r/s and convert her appt into a doxy.me visit with Judson Roch.

## 2019-01-03 ENCOUNTER — Encounter: Payer: Self-pay | Admitting: Family Medicine

## 2019-01-03 ENCOUNTER — Ambulatory Visit (INDEPENDENT_AMBULATORY_CARE_PROVIDER_SITE_OTHER): Payer: Federal, State, Local not specified - PPO | Admitting: Family Medicine

## 2019-01-03 VITALS — BP 120/79 | HR 82 | Temp 97.4°F | Wt 144.0 lb

## 2019-01-03 DIAGNOSIS — M722 Plantar fascial fibromatosis: Secondary | ICD-10-CM | POA: Diagnosis not present
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
## 2019-01-10 ENCOUNTER — Other Ambulatory Visit: Payer: Self-pay | Admitting: *Deleted

## 2019-01-10 ENCOUNTER — Encounter: Payer: Federal, State, Local not specified - PPO | Admitting: Psychology

## 2019-01-10 MED ORDER — ALPRAZOLAM 2 MG PO TABS: 2.0000 mg | ORAL_TABLET | Freq: Every evening | ORAL | 2 refills | Status: DC | PRN

## 2019-01-11 ENCOUNTER — Ambulatory Visit: Payer: Federal, State, Local not specified - PPO | Admitting: Neurology

## 2019-01-17 ENCOUNTER — Encounter: Payer: Self-pay | Admitting: Family Medicine

## 2019-01-17 ENCOUNTER — Ambulatory Visit (INDEPENDENT_AMBULATORY_CARE_PROVIDER_SITE_OTHER): Payer: Federal, State, Local not specified - PPO | Admitting: Family Medicine

## 2019-01-17 VITALS — BP 116/61 | HR 79 | Ht 65.0 in | Wt 138.0 lb

## 2019-01-17 DIAGNOSIS — F349 Persistent mood [affective] disorder, unspecified: Secondary | ICD-10-CM

## 2019-01-17 MED ORDER — ARIPIPRAZOLE 5 MG PO TABS: 5.0000 mg | ORAL_TABLET | Freq: Every day | ORAL | 1 refills | Status: DC

## 2019-01-17 NOTE — Patient Instructions (Signed)
Taper Wellbutrin.  Take 1 every other day for 10 days and then okay to stop.

## 2019-01-17 NOTE — Assessment & Plan Note (Addendum)
I did have her complete a mood questionnaire for screening for possible bipolar disorder.  She answered yes to 8 of the questions.  She answered yes to #2 which is having several of these happened during the same period of time, and she rated it somewhere between a minor and moderate problem.  She denies any significant family history, besides depression.  We discussed trying a low-dose mood stabilizer while in the interim we work on getting her in with a psychiatrist for more formal evaluation to determine if she really does have bipolar disorder or if she has more severed depression.
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
## 2019-01-31 ENCOUNTER — Other Ambulatory Visit: Payer: Self-pay

## 2019-01-31 ENCOUNTER — Ambulatory Visit (INDEPENDENT_AMBULATORY_CARE_PROVIDER_SITE_OTHER): Payer: Federal, State, Local not specified - PPO | Admitting: Family Medicine

## 2019-01-31 VITALS — BP 128/75 | HR 73 | Ht 65.0 in | Wt 134.0 lb

## 2019-01-31 DIAGNOSIS — D510 Vitamin B12 deficiency anemia due to intrinsic factor deficiency: Secondary | ICD-10-CM | POA: Diagnosis not present

## 2019-01-31 MED ORDER — CYANOCOBALAMIN 1000 MCG/ML IJ SOLN
1000.0000 ug | Freq: Once | INTRAMUSCULAR | Status: AC
  Administered 2019-01-31: 15:00:00 1000 ug via INTRAMUSCULAR

## 2019-01-31 NOTE — Progress Notes (Signed)
Agree with documentation as above.   Jamieon Lannen, MD  

## 2019-01-31 NOTE — Progress Notes (Signed)
   Subjective:    Patient ID: [name], female    DOB: 05/03/1971, 48 y.o.   MRN: [id number]  HPI Patient is here for a Vitamin B12 injection. Denied any gastrointestional problems or dizziness.  Patient is starting B12 injections per labs. Will do 1 injections every 2 weeks for 2 months.    Review of Systems     Objective:   Physical Exam        Assessment & Plan:  Patient tolerated injection well without complication. Patient advised to schedule next injection in 14 days

## 2019-02-15 ENCOUNTER — Ambulatory Visit (INDEPENDENT_AMBULATORY_CARE_PROVIDER_SITE_OTHER): Payer: Federal, State, Local not specified - PPO | Admitting: Family Medicine

## 2019-02-15 ENCOUNTER — Telehealth: Payer: Self-pay | Admitting: Family Medicine

## 2019-02-15 ENCOUNTER — Encounter: Payer: Self-pay | Admitting: Family Medicine

## 2019-02-15 VITALS — BP 121/68 | HR 78 | Ht 65.0 in | Wt 139.0 lb

## 2019-02-15 DIAGNOSIS — G250 Essential tremor: Secondary | ICD-10-CM

## 2019-02-15 DIAGNOSIS — E559 Vitamin D deficiency, unspecified: Secondary | ICD-10-CM

## 2019-02-15 DIAGNOSIS — L52 Erythema nodosum: Secondary | ICD-10-CM

## 2019-02-15 DIAGNOSIS — D510 Vitamin B12 deficiency anemia due to intrinsic factor deficiency: Secondary | ICD-10-CM

## 2019-02-15 DIAGNOSIS — Z1231 Encounter for screening mammogram for malignant neoplasm of breast: Secondary | ICD-10-CM

## 2019-02-15 DIAGNOSIS — F349 Persistent mood [affective] disorder, unspecified: Secondary | ICD-10-CM

## 2019-02-15 DIAGNOSIS — Z5181 Encounter for therapeutic drug level monitoring: Secondary | ICD-10-CM

## 2019-02-15 DIAGNOSIS — G90523 Complex regional pain syndrome I of lower limb, bilateral: Secondary | ICD-10-CM

## 2019-02-15 DIAGNOSIS — G43009 Migraine without aura, not intractable, without status migrainosus: Secondary | ICD-10-CM

## 2019-02-15 DIAGNOSIS — E611 Iron deficiency: Secondary | ICD-10-CM

## 2019-02-15 DIAGNOSIS — M7989 Other specified soft tissue disorders: Secondary | ICD-10-CM

## 2019-02-15 MED ORDER — ARIPIPRAZOLE 10 MG PO TABS: 10.0000 mg | ORAL_TABLET | Freq: Every day | ORAL | 1 refills | Status: DC

## 2019-02-15 MED ORDER — POTASSIUM CHLORIDE CRYS ER 10 MEQ PO TBCR: 10.0000 meq | EXTENDED_RELEASE_TABLET | Freq: Every day | ORAL | 2 refills | Status: DC | PRN

## 2019-02-15 MED ORDER — CYANOCOBALAMIN 1000 MCG/ML IJ SOLN
1000.0000 ug | Freq: Once | INTRAMUSCULAR | Status: AC
  Administered 2019-02-15: 15:00:00 1000 ug via INTRAMUSCULAR

## 2019-02-15 NOTE — Assessment & Plan Note (Signed)
We will work on try to get her back in with neurology.  We should just be able to call and reactivate the initial referral but if we do need to plate placed a new one and then that is perfectly fine.
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
## 2019-02-15 NOTE — Assessment & Plan Note (Signed)
Continue to Mindy supplementation.  We will plan to recheck levels in about 2 months.

## 2019-02-15 NOTE — Assessment & Plan Note (Signed)
She cannot tolerate over-the-counter iron we could certainly try either a prenatal vitamin which has a little extra or even consider the Enfamil infant drops and just do 2 drops on her tongue daily as well as working on iron rich diet.

## 2019-02-15 NOTE — Assessment & Plan Note (Addendum)
Has started B12 injections.  Recheck levels in about 8 weeks.

## 2019-02-15 NOTE — Assessment & Plan Note (Signed)
This is most consistent with venous stasis.  She has normal kidney and liver function as well as thyroid.  She is had consultation with vascular and has no sign of peripheral vascular disease.  Discussed that the main treatment is compression stockings and diuretics.  She does have some Lasix at home so just encouraged her to try that over the weekend.  I will send over some potassium so that she can take it when she takes the Lasix.  She may find that she only needs it every other day or even just a couple of times a week.

## 2019-02-15 NOTE — Assessment & Plan Note (Signed)
Uncontrolled.  Though it sounds like her neck could be a trigger.  Some get a go ahead and put in a order for a plain film x-ray and then we may need to get CT as she does have hardware in her cervical spine.

## 2019-02-15 NOTE — Telephone Encounter (Signed)
Please call and see if her neurology referral can be scheduled.  Evidently she was given an appointment but then when [place] hit they called and canceled it but she says no one from their office has reach back out to her to reschedule.  If we can call them back and follow-up on this.  If we need to initiate a totally new referral been just let me know.

## 2019-02-15 NOTE — Progress Notes (Signed)
Pt reports that she is doing well on current regimen. She wanted to know if the strength of the Abilify can be increased.   She would like to discuss having constipation. She has gained 5 lbs. She also c/o lower leg swelling. (hasn't been taking lasix would like to disuss).   Pt has appt w/Psych next month.  Maryruth Eve, Lahoma Crocker, CMA

## 2019-02-16 MED ORDER — POTASSIUM CHLORIDE CRYS ER 10 MEQ PO TBCR: 10.0000 meq | EXTENDED_RELEASE_TABLET | Freq: Every day | ORAL | 2 refills | Status: DC | PRN

## 2019-02-16 MED ORDER — FUROSEMIDE 20 MG PO TABS: 20.0000 mg | ORAL_TABLET | Freq: Every day | ORAL | 0 refills | Status: DC | PRN

## 2019-02-16 MED ORDER — ARIPIPRAZOLE 10 MG PO TABS: 10.0000 mg | ORAL_TABLET | Freq: Every day | ORAL | 1 refills | Status: DC

## 2019-02-16 NOTE — Addendum Note (Signed)
Addended by: Beatrice Lecher D on: 02/16/2019 12:10 PM   Modules accepted: Orders

## 2019-02-21 ENCOUNTER — Other Ambulatory Visit: Payer: Self-pay | Admitting: Family Medicine

## 2019-02-21 DIAGNOSIS — M7989 Other specified soft tissue disorders: Secondary | ICD-10-CM

## 2019-02-21 MED ORDER — FUROSEMIDE 20 MG PO TABS: 20.0000 mg | ORAL_TABLET | Freq: Every day | ORAL | 0 refills | Status: DC | PRN

## 2019-02-28 ENCOUNTER — Ambulatory Visit (INDEPENDENT_AMBULATORY_CARE_PROVIDER_SITE_OTHER): Payer: Federal, State, Local not specified - PPO | Admitting: Family Medicine

## 2019-02-28 ENCOUNTER — Telehealth: Payer: Self-pay | Admitting: Family Medicine

## 2019-02-28 ENCOUNTER — Encounter: Payer: Self-pay | Admitting: Family Medicine

## 2019-02-28 VITALS — BP 108/65 | HR 79 | Temp 98.0°F | Wt 139.0 lb

## 2019-02-28 DIAGNOSIS — M722 Plantar fascial fibromatosis: Secondary | ICD-10-CM | POA: Diagnosis not present

## 2019-02-28 NOTE — Progress Notes (Signed)
Patient presents to clinic today for previously arranged left plantar fascia injection.  Patient notes worsening pain in her plantar fascia.  She has been doing lots of conservative management including eccentric exercises and padded gel inserts.  Pain is worsening and she would like to proceed with injections today if possible.  She is in the process of considering surgery.  Procedure: Real-time Ultrasound Guided Injection of left plantar fascia Device: GE Logiq E   Images permanently stored and available for review in the ultrasound unit. Verbal informed consent obtained.  Discussed risks and benefits of procedure. Warned about infection bleeding damage to structures skin hypopigmentation and fat atrophy among others. Patient expresses understanding and agreement Time-out conducted.   Noted no overlying erythema, induration, or other signs of local infection.   Skin prepped in a sterile fashion.   Local anesthesia: Topical Ethyl chloride.   With sterile technique and under real time ultrasound guidance:  40 mg of Depo-Medrol and 2 mL of Marcaine injected easily.   Completed without difficulty   Pain immediately resolved suggesting accurate placement of the medication.   Advised to call if fevers/chills, erythema, induration, drainage, or persistent bleeding.   Images permanently stored and available for review in the ultrasound unit.  Impression: Technically successful ultrasound guided injection.

## 2019-02-28 NOTE — Telephone Encounter (Signed)
Pt prefers to see Dr. Jetta Lout, can you put referral in to him. Ortho-Surgeon.

## 2019-02-28 NOTE — Telephone Encounter (Signed)
Plan for referral to orthopedic surgery foot and ankle specialist for possible surgical options regarding plantar fasciitis

## 2019-02-28 NOTE — Addendum Note (Signed)
Addended by: Gregor Hams on: 02/28/2019 05:26 PM   Modules accepted: Orders

## 2019-03-01 NOTE — Telephone Encounter (Signed)
Patient notified and did not have any further questions.

## 2019-03-05 ENCOUNTER — Telehealth: Payer: Self-pay | Admitting: *Deleted

## 2019-03-05 DIAGNOSIS — R233 Spontaneous ecchymoses: Secondary | ICD-10-CM

## 2019-03-05 DIAGNOSIS — R238 Other skin changes: Secondary | ICD-10-CM

## 2019-03-05 DIAGNOSIS — R5383 Other fatigue: Secondary | ICD-10-CM

## 2019-03-05 NOTE — Telephone Encounter (Signed)
Pt reports that she has noticed bruising bilaterally on her arms,legs, and neck. She stated that these areas have "knots" and are painful. She denies any injury/trauma that could have caused this.   She did go online to look this up and she stated that it advised that she speak to her physician about this.  Routing to pcp for review.[name], Lahoma Crocker, CMA

## 2019-03-05 NOTE — Telephone Encounter (Signed)
Form faxed to [facility]  Service benefit plan confirmation received scanned into chart. Also mailed to  [address] [address] [place] [address].Elouise Munroe, [place]

## 2019-03-07 ENCOUNTER — Ambulatory Visit (INDEPENDENT_AMBULATORY_CARE_PROVIDER_SITE_OTHER): Payer: Federal, State, Local not specified - PPO | Admitting: Family Medicine

## 2019-03-07 ENCOUNTER — Other Ambulatory Visit: Payer: Self-pay

## 2019-03-07 VITALS — BP 125/64 | HR 69

## 2019-03-07 DIAGNOSIS — D510 Vitamin B12 deficiency anemia due to intrinsic factor deficiency: Secondary | ICD-10-CM | POA: Diagnosis not present

## 2019-03-07 DIAGNOSIS — G894 Chronic pain syndrome: Secondary | ICD-10-CM | POA: Diagnosis not present

## 2019-03-07 MED ORDER — CYANOCOBALAMIN 1000 MCG/ML IJ SOLN
1000.0000 ug | Freq: Once | INTRAMUSCULAR | Status: AC
  Administered 2019-03-07: 1000 ug via INTRAMUSCULAR

## 2019-03-07 NOTE — Progress Notes (Signed)
Agree with documentation as above.   Zakariyya Helfman, MD  

## 2019-03-07 NOTE — Progress Notes (Signed)
   Subjective:    Patient ID: [name], female    DOB: 09-15-1970, 48 y.o.   MRN: [id number]  HPI Patient is here for a Vitamin B12 injection. Denied any gastrointestional problems or dizziness.    Review of Systems     Objective:   Physical Exam        Assessment & Plan:  Patient tolerated injection well without complication. Patient advised to schedule next injection in 14 days

## 2019-03-08 DIAGNOSIS — E559 Vitamin D deficiency, unspecified: Secondary | ICD-10-CM | POA: Diagnosis not present

## 2019-03-08 DIAGNOSIS — R238 Other skin changes: Secondary | ICD-10-CM | POA: Diagnosis not present

## 2019-03-08 DIAGNOSIS — Z5181 Encounter for therapeutic drug level monitoring: Secondary | ICD-10-CM | POA: Diagnosis not present

## 2019-03-08 DIAGNOSIS — R5383 Other fatigue: Secondary | ICD-10-CM | POA: Diagnosis not present

## 2019-03-08 DIAGNOSIS — D510 Vitamin B12 deficiency anemia due to intrinsic factor deficiency: Secondary | ICD-10-CM | POA: Diagnosis not present

## 2019-03-08 NOTE — Telephone Encounter (Signed)
OK plan to so some additional labs.   Beatrice Lecher, MD

## 2019-03-09 LAB — BASIC METABOLIC PANEL WITH GFR
BUN: 7 mg/dL (ref 7–25)
CO2: 30 mmol/L (ref 20–32)
Calcium: 9.5 mg/dL (ref 8.6–10.4)
Chloride: 102 mmol/L (ref 98–110)
Creat: 0.84 mg/dL (ref 0.50–1.05)
GFR, Est African American: 94 mL/min/{1.73_m2} (ref >=60)
GFR, Est Non African American: 81 mL/min/{1.73_m2} (ref >=60)
Glucose, Bld: 98 mg/dL (ref 65–99)
Potassium: 3.8 mmol/L (ref 3.5–5.3)
Sodium: 140 mmol/L (ref 135–146)

## 2019-03-09 LAB — VITAMIN D 25 HYDROXY (VIT D DEFICIENCY, FRACTURES): Vit D, 25-Hydroxy: 17 ng/mL — ABNORMAL LOW (ref 30–100)

## 2019-03-09 LAB — VITAMIN B12: Vitamin B-12: >2000 pg/mL — ABNORMAL HIGH (ref 200–1100)

## 2019-03-12 MED ORDER — VITAMIN D (ERGOCALCIFEROL) 1.25 MG (50000 UNIT) PO CAPS: 50000.0000 [IU] | ORAL_CAPSULE | ORAL | 1 refills | Status: DC

## 2019-03-12 NOTE — Addendum Note (Signed)
Addended by: Beatrice Lecher D on: 03/12/2019 05:27 PM   Modules accepted: Orders

## 2019-03-15 LAB — CBC WITH DIFFERENTIAL/PLATELET
Absolute Monocytes: 533 cells/uL (ref 200–950)
Basophils Absolute: 43 cells/uL (ref 0–200)
Basophils Relative: 0.6 %
Eosinophils Absolute: 78 cells/uL (ref 15–500)
Eosinophils Relative: 1.1 %
HCT: 41.3 % (ref 35.0–45.0)
Hemoglobin: 14 g/dL (ref 11.7–15.5)
Lymphs Abs: 1555 cells/uL (ref 850–3900)
MCH: 30 pg (ref 27.0–33.0)
MCHC: 33.9 g/dL (ref 32.0–36.0)
MCV: 88.6 fL (ref 80.0–100.0)
MPV: 10 fL (ref 7.5–12.5)
Monocytes Relative: 7.5 %
Neutro Abs: 4892 cells/uL (ref 1500–7800)
Neutrophils Relative %: 68.9 %
Platelets: 382 10*3/uL (ref 140–400)
RBC: 4.66 10*6/uL (ref 3.80–5.10)
RDW: 12.4 % (ref 11.0–15.0)
Total Lymphocyte: 21.9 %
WBC: 7.1 10*3/uL (ref 3.8–10.8)

## 2019-03-15 LAB — PROTIME-INR
INR: 1
Prothrombin Time: 10 s (ref 9.0–11.5)

## 2019-03-15 LAB — VITAMIN C: Vitamin C: 0.2 mg/dL — ABNORMAL LOW (ref 0.3–2.7)

## 2019-03-15 LAB — SEDIMENTATION RATE: Sed Rate: 6 mm/h (ref 0–20)

## 2019-03-15 LAB — VITAMIN K1, SERUM: Vitamin K: 3013 pg/mL — ABNORMAL HIGH (ref 130–1500)

## 2019-03-21 ENCOUNTER — Ambulatory Visit: Payer: Federal, State, Local not specified - PPO

## 2019-03-27 ENCOUNTER — Telehealth: Payer: Self-pay

## 2019-03-27 ENCOUNTER — Other Ambulatory Visit: Payer: Self-pay | Admitting: Family Medicine

## 2019-03-27 DIAGNOSIS — F349 Persistent mood [affective] disorder, unspecified: Secondary | ICD-10-CM

## 2019-03-27 DIAGNOSIS — G250 Essential tremor: Secondary | ICD-10-CM

## 2019-03-27 DIAGNOSIS — Z79899 Other long term (current) drug therapy: Secondary | ICD-10-CM | POA: Diagnosis not present

## 2019-03-27 DIAGNOSIS — Z5181 Encounter for therapeutic drug level monitoring: Secondary | ICD-10-CM | POA: Diagnosis not present

## 2019-03-27 MED ORDER — MECLIZINE HCL 25 MG PO TABS: 25.0000 mg | ORAL_TABLET | Freq: Three times a day (TID) | ORAL | 0 refills | Status: DC | PRN

## 2019-03-27 NOTE — Telephone Encounter (Signed)
OK, rx sent.

## 2019-03-27 NOTE — Telephone Encounter (Signed)
Hi Cindy, evidently were still struggling getting her behavioral health referral placed.  I really would like for her to see a psychiatrist who can help with medication management.  Since she has [employer] it will have to be in network and that is likely going to be [facility].  We may just need to start all over.  The original referral was placed in June but I keep getting notes from them saying that we have referred her to physiatry instead of psychiatry.  Can we go ahead and reach out to them for a referral.

## 2019-03-27 NOTE — Telephone Encounter (Signed)
Jing report vertigo. She states she has had vertigo before. She would like meclizine. Please advise.

## 2019-03-27 NOTE — Telephone Encounter (Signed)
Patient advised.

## 2019-03-29 NOTE — Telephone Encounter (Signed)
Routing to [place] for review.

## 2019-04-03 DIAGNOSIS — M79672 Pain in left foot: Secondary | ICD-10-CM | POA: Diagnosis not present

## 2019-04-04 ENCOUNTER — Other Ambulatory Visit: Payer: Self-pay | Admitting: *Deleted

## 2019-04-04 MED ORDER — VITAMIN D (ERGOCALCIFEROL) 1.25 MG (50000 UNIT) PO CAPS: 50000.0000 [IU] | ORAL_CAPSULE | ORAL | 1 refills | Status: DC

## 2019-04-10 MED ORDER — PRIMIDONE 50 MG PO TABS: 50.0000 mg | ORAL_TABLET | Freq: Four times a day (QID) | ORAL | 1 refills | Status: DC

## 2019-04-11 NOTE — Telephone Encounter (Signed)
Called and patient missed appointment placed new referral faxed on 9/16 should call with new appointment - [facility]

## 2019-04-11 NOTE — Telephone Encounter (Signed)
Done

## 2019-04-12 ENCOUNTER — Other Ambulatory Visit: Payer: Self-pay | Admitting: *Deleted

## 2019-04-12 DIAGNOSIS — F349 Persistent mood [affective] disorder, unspecified: Secondary | ICD-10-CM

## 2019-04-12 MED ORDER — ARIPIPRAZOLE 10 MG PO TABS: 10.0000 mg | ORAL_TABLET | Freq: Every day | ORAL | 1 refills | Status: DC

## 2019-04-17 MED ORDER — ALPRAZOLAM 2 MG PO TABS: 2.0000 mg | ORAL_TABLET | Freq: Every evening | ORAL | 2 refills | Status: DC | PRN

## 2019-04-17 NOTE — Telephone Encounter (Signed)
Contacted pt - pls send rx to [facility]. Thanks.

## 2019-04-17 NOTE — Telephone Encounter (Signed)
Pt called stating she needs  A refill on her alprazolam, states she is out today. Will forward to Dr. Joellyn Quails for approval.

## 2019-04-17 NOTE — Telephone Encounter (Signed)
rx sent

## 2019-04-17 NOTE — Telephone Encounter (Signed)
Which pharmacy?

## 2019-04-24 ENCOUNTER — Other Ambulatory Visit: Payer: Self-pay | Admitting: Family Medicine

## 2019-04-24 DIAGNOSIS — F349 Persistent mood [affective] disorder, unspecified: Secondary | ICD-10-CM

## 2019-04-24 NOTE — Progress Notes (Signed)
Will place new referr for [facility] with Dr. Lyndee Leo

## 2019-04-26 ENCOUNTER — Other Ambulatory Visit: Payer: Self-pay | Admitting: *Deleted

## 2019-04-26 ENCOUNTER — Encounter: Payer: Self-pay | Admitting: Family Medicine

## 2019-04-26 ENCOUNTER — Ambulatory Visit (INDEPENDENT_AMBULATORY_CARE_PROVIDER_SITE_OTHER): Payer: Federal, State, Local not specified - PPO | Admitting: Family Medicine

## 2019-04-26 VITALS — BP 114/65 | HR 69 | Temp 98.2°F | Ht 65.0 in | Wt 143.0 lb

## 2019-04-26 DIAGNOSIS — J0100 Acute maxillary sinusitis, unspecified: Secondary | ICD-10-CM | POA: Diagnosis not present

## 2019-04-26 MED ORDER — SUMATRIPTAN SUCCINATE 50 MG PO TABS: ORAL_TABLET | ORAL | 11 refills | Status: DC

## 2019-04-26 MED ORDER — AZITHROMYCIN 250 MG PO TABS: ORAL_TABLET | ORAL | 0 refills | Status: AC
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
## 2019-04-29 DIAGNOSIS — Z01818 Encounter for other preprocedural examination: Secondary | ICD-10-CM | POA: Diagnosis not present

## 2019-05-01 DIAGNOSIS — G894 Chronic pain syndrome: Secondary | ICD-10-CM | POA: Diagnosis not present

## 2019-05-01 DIAGNOSIS — G90522 Complex regional pain syndrome I of left lower limb: Secondary | ICD-10-CM | POA: Diagnosis not present

## 2019-05-01 DIAGNOSIS — M792 Neuralgia and neuritis, unspecified: Secondary | ICD-10-CM | POA: Diagnosis not present

## 2019-05-01 DIAGNOSIS — M21612 Bunion of left foot: Secondary | ICD-10-CM | POA: Diagnosis not present

## 2019-05-03 DIAGNOSIS — Z885 Allergy status to narcotic agent status: Secondary | ICD-10-CM | POA: Diagnosis not present

## 2019-05-03 DIAGNOSIS — M25561 Pain in right knee: Secondary | ICD-10-CM | POA: Diagnosis not present

## 2019-05-03 DIAGNOSIS — M5412 Radiculopathy, cervical region: Secondary | ICD-10-CM | POA: Diagnosis not present

## 2019-05-03 DIAGNOSIS — Z79899 Other long term (current) drug therapy: Secondary | ICD-10-CM | POA: Diagnosis not present

## 2019-05-03 DIAGNOSIS — F17210 Nicotine dependence, cigarettes, uncomplicated: Secondary | ICD-10-CM | POA: Diagnosis not present

## 2019-05-03 DIAGNOSIS — M722 Plantar fascial fibromatosis: Secondary | ICD-10-CM | POA: Diagnosis not present

## 2019-05-03 DIAGNOSIS — F329 Major depressive disorder, single episode, unspecified: Secondary | ICD-10-CM | POA: Diagnosis not present

## 2019-05-03 DIAGNOSIS — M79672 Pain in left foot: Secondary | ICD-10-CM | POA: Diagnosis not present

## 2019-05-03 DIAGNOSIS — Z880 Allergy status to penicillin: Secondary | ICD-10-CM | POA: Diagnosis not present

## 2019-05-08 DIAGNOSIS — M79672 Pain in left foot: Secondary | ICD-10-CM | POA: Diagnosis not present

## 2019-05-13 DIAGNOSIS — M79672 Pain in left foot: Secondary | ICD-10-CM | POA: Diagnosis not present

## 2019-05-21 ENCOUNTER — Telehealth: Payer: Self-pay | Admitting: Family Medicine

## 2019-05-21 MED ORDER — PRIMIDONE 50 MG PO TABS: 25.0000 mg | ORAL_TABLET | Freq: Every day | ORAL | 1 refills | Status: DC

## 2019-05-21 NOTE — Telephone Encounter (Signed)
We will resend primidone to [facility].  We will start with anywhere between half a tab up to 2 tabs as needed at bedtime it is sedating to try this for tremor instead of the propranolol.  Also will check on movement disorder referral that was placed in March trip for [place] hit.  It was placed [facility] she says she was never contacted.  Jenny Reichmann if you can check in on this and see where we are at on it.  Let me know.

## 2019-05-27 NOTE — Telephone Encounter (Signed)
Resent referral to [facility] clinic - [facility]

## 2019-05-30 DIAGNOSIS — F332 Major depressive disorder, recurrent severe without psychotic features: Secondary | ICD-10-CM | POA: Diagnosis not present

## 2019-06-05 ENCOUNTER — Other Ambulatory Visit: Payer: Self-pay | Admitting: *Deleted

## 2019-06-05 MED ORDER — OMEPRAZOLE 40 MG PO CPDR: 40.0000 mg | DELAYED_RELEASE_CAPSULE | Freq: Every morning | ORAL | 3 refills | Status: DC

## 2019-06-05 MED ORDER — PROCHLORPERAZINE MALEATE 5 MG PO TABS: 5.0000 mg | ORAL_TABLET | Freq: Three times a day (TID) | ORAL | 0 refills | Status: DC

## 2019-06-11 ENCOUNTER — Encounter: Payer: Self-pay | Admitting: Family Medicine

## 2019-06-11 ENCOUNTER — Other Ambulatory Visit: Payer: Self-pay

## 2019-06-11 ENCOUNTER — Ambulatory Visit (INDEPENDENT_AMBULATORY_CARE_PROVIDER_SITE_OTHER): Payer: No Typology Code available for payment source | Admitting: Family Medicine

## 2019-06-11 VITALS — Temp 98.7°F | Wt 148.0 lb

## 2019-06-11 DIAGNOSIS — F339 Major depressive disorder, recurrent, unspecified: Secondary | ICD-10-CM | POA: Diagnosis not present

## 2019-06-11 DIAGNOSIS — R059 Cough, unspecified: Secondary | ICD-10-CM

## 2019-06-11 DIAGNOSIS — N760 Acute vaginitis: Secondary | ICD-10-CM

## 2019-06-11 DIAGNOSIS — R05 Cough: Secondary | ICD-10-CM | POA: Diagnosis not present

## 2019-06-11 DIAGNOSIS — F5104 Psychophysiologic insomnia: Secondary | ICD-10-CM | POA: Diagnosis not present

## 2019-06-11 MED ORDER — FLUCONAZOLE 150 MG PO TABS: 150.0000 mg | ORAL_TABLET | Freq: Once | ORAL | 0 refills | Status: AC

## 2019-06-11 NOTE — Progress Notes (Addendum)
Virtual Visit via Video Note  I connected with [name] on 06/11/19 at 11:30 AM EST by a video enabled telemedicine application and verified that I am speaking with the correct person using two identifiers.   I discussed the limitations of evaluation and management by telemedicine and the availability of in person appointments. The patient expressed understanding and agreed to proceed.  Patient location: home Provider location: Office  Subjective:    CC: F/U Mood.    HPI: She did consult with psychiatry.  They felt like her diagnosis was most consistent with major depressive disorder and that she did not have any significant signs of bipolar disorder.  Thus he recommended discontinuing the Abilify.  With her underlying tremor he was very concerned about the possibility of tardive dyskinesia effects and she is also gained a significant amount of weight on the medication even though it has actually been pretty helpful for her mood.  He would like to switch her Effexor which she feels like is not working well to her for her to [place].  Unfortunately her part of the cost was about $300 per month which she is not able to afford and so wanted to discuss other options.  She is also tried Fetzima in the past and that actually worked well to.  She is actually never tried [place].  In regards to sleep he wanted her to try Rozerem with the goal of eventually being able to taper off the alprazolam.  He also suggested that she could take the alprazolam up to 3 times a day a quarter tab each time but she is worried about excess sedation.  She also wanted let me know that she did have a postop wound infection and so Dr. Jetta Lout has placed her on clindamycin she now feels like she has a yeast infection.  And would like to know if some Diflucan can be sent over to the pharmacy she has vaginal itching and irritation.  Today she is home from work because she woke up with a runny nose and cough.  She  has been around her sister who works at one of the [employer] who also recently tested positive for Covid.  So health at work encouraged her to stay home and quarantine.  Past medical history, Surgical history, Family history not pertinant except as noted below, Social history, Allergies, and medications have been entered into the medical record, reviewed, and corrections made.   Review of Systems: No fevers, chills, night sweats, weight loss, chest pain, or shortness of breath.   Objective:    General: Speaking clearly in complete sentences without any shortness of breath.  Alert and oriented x3.  Normal judgment. No apparent acute distress.    Impression and Recommendations:    MDD -she is, discontinue Abilify.  We will try to get Viibryd covered better under her insurance.  We will request her extension as she has tried multiple medications in the past and her current medication which is Effexor does not seem to be working well for her.  We will go from there.  Insomnia, psychological-she is can try Rozerem with the hope of being able to either at least decrease or taper her Ativan over time.  Vaginitis-we will treat with Diflucan call if not better  Upper respiratory symptoms-currently being evaluated for Covid as she is had a known exposure with her sister.    Time spent 30 minutes in encounter including review notes.   I discussed the assessment and treatment  plan with the patient. The patient was provided an opportunity to ask questions and all were answered. The patient agreed with the plan and demonstrated an understanding of the instructions.   The patient was advised to call back or seek an in-person evaluation if the symptoms worsen or if the condition fails to improve as anticipated.   Beatrice Lecher, MD

## 2019-06-11 NOTE — Progress Notes (Signed)
She would like to discuss what the psychiatrist advised.Maryruth Eve, Lahoma Crocker, CMA

## 2019-07-02 DIAGNOSIS — M21612 Bunion of left foot: Secondary | ICD-10-CM | POA: Diagnosis not present

## 2019-07-02 DIAGNOSIS — G90522 Complex regional pain syndrome I of left lower limb: Secondary | ICD-10-CM | POA: Diagnosis not present

## 2019-07-02 DIAGNOSIS — M792 Neuralgia and neuritis, unspecified: Secondary | ICD-10-CM | POA: Diagnosis not present

## 2019-07-02 DIAGNOSIS — G894 Chronic pain syndrome: Secondary | ICD-10-CM | POA: Diagnosis not present

## 2019-07-12 ENCOUNTER — Other Ambulatory Visit: Payer: Self-pay

## 2019-07-12 MED ORDER — ALPRAZOLAM 2 MG PO TABS: 2.0000 mg | ORAL_TABLET | Freq: Every evening | ORAL | 2 refills | Status: DC | PRN

## 2019-07-12 NOTE — Telephone Encounter (Signed)
Requesting RF on Xanax  PCP out of office  RX pemded

## 2019-07-17 ENCOUNTER — Other Ambulatory Visit: Payer: Self-pay | Admitting: *Deleted

## 2019-07-17 MED ORDER — PRIMIDONE 50 MG PO TABS: 25.0000 mg | ORAL_TABLET | Freq: Every day | ORAL | 1 refills | Status: DC

## 2019-07-23 ENCOUNTER — Other Ambulatory Visit: Payer: Self-pay | Admitting: *Deleted

## 2019-07-23 MED ORDER — AMPHETAMINE-DEXTROAMPHETAMINE 20 MG PO TABS: 20.0000 mg | ORAL_TABLET | Freq: Two times a day (BID) | ORAL | 0 refills | Status: DC

## 2019-07-23 MED ORDER — SUMATRIPTAN SUCCINATE 50 MG PO TABS: ORAL_TABLET | ORAL | 3 refills | Status: DC

## 2019-08-12 ENCOUNTER — Telehealth: Payer: Self-pay | Admitting: *Deleted

## 2019-08-12 MED ORDER — ALPRAZOLAM 2 MG PO TABS: 2.0000 mg | ORAL_TABLET | Freq: Every evening | ORAL | 0 refills | Status: DC | PRN

## 2019-08-12 NOTE — Telephone Encounter (Signed)
Med refilled.

## 2019-08-12 NOTE — Telephone Encounter (Signed)
Pt informed that her pharmacy could not refill her Xanax until February due to having a limited supply.  She asked that we send her refill to [facility] in [place].

## 2019-08-19 ENCOUNTER — Ambulatory Visit (INDEPENDENT_AMBULATORY_CARE_PROVIDER_SITE_OTHER): Payer: No Typology Code available for payment source | Admitting: Physician Assistant

## 2019-08-19 ENCOUNTER — Encounter: Payer: Self-pay | Admitting: Physician Assistant

## 2019-08-19 VITALS — BP 125/78 | HR 79 | Temp 97.5°F

## 2019-08-19 DIAGNOSIS — R430 Anosmia: Secondary | ICD-10-CM | POA: Diagnosis not present

## 2019-08-19 DIAGNOSIS — R432 Parageusia: Secondary | ICD-10-CM

## 2019-08-19 DIAGNOSIS — R42 Dizziness and giddiness: Secondary | ICD-10-CM

## 2019-08-19 DIAGNOSIS — R0789 Other chest pain: Secondary | ICD-10-CM

## 2019-08-19 DIAGNOSIS — R519 Headache, unspecified: Secondary | ICD-10-CM

## 2019-08-19 DIAGNOSIS — Z20822 Contact with and (suspected) exposure to covid-19: Secondary | ICD-10-CM

## 2019-08-19 MED ORDER — ALBUTEROL SULFATE HFA 108 (90 BASE) MCG/ACT IN AERS: 2.0000 | INHALATION_SPRAY | Freq: Four times a day (QID) | RESPIRATORY_TRACT | 0 refills | Status: DC | PRN
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
## 2019-08-21 LAB — NOVEL CORONAVIRUS, NAA: SARS-CoV-2, NAA: NOT DETECTED

## 2019-08-21 LAB — SPECIMEN STATUS REPORT

## 2019-08-21 NOTE — Progress Notes (Signed)
Negative for covid

## 2019-09-09 ENCOUNTER — Other Ambulatory Visit: Payer: Self-pay | Admitting: Family Medicine

## 2019-09-09 MED ORDER — OMEPRAZOLE 40 MG PO CPDR: 40.0000 mg | DELAYED_RELEASE_CAPSULE | Freq: Every morning | ORAL | 0 refills | Status: DC

## 2019-09-09 MED ORDER — ALPRAZOLAM 2 MG PO TABS: 2.0000 mg | ORAL_TABLET | Freq: Every evening | ORAL | 0 refills | Status: DC | PRN

## 2019-09-19 ENCOUNTER — Encounter: Payer: Self-pay | Admitting: Family Medicine

## 2019-09-19 ENCOUNTER — Ambulatory Visit (INDEPENDENT_AMBULATORY_CARE_PROVIDER_SITE_OTHER): Payer: No Typology Code available for payment source | Admitting: Family Medicine

## 2019-09-19 VITALS — BP 126/66 | HR 70 | Ht 65.0 in | Wt 137.0 lb

## 2019-09-19 DIAGNOSIS — F5104 Psychophysiologic insomnia: Secondary | ICD-10-CM | POA: Diagnosis not present

## 2019-09-19 DIAGNOSIS — F349 Persistent mood [affective] disorder, unspecified: Secondary | ICD-10-CM | POA: Diagnosis not present

## 2019-09-19 DIAGNOSIS — G90529 Complex regional pain syndrome I of unspecified lower limb: Secondary | ICD-10-CM | POA: Diagnosis not present

## 2019-09-19 DIAGNOSIS — R790 Abnormal level of blood mineral: Secondary | ICD-10-CM

## 2019-09-19 DIAGNOSIS — N959 Unspecified menopausal and perimenopausal disorder: Secondary | ICD-10-CM

## 2019-09-19 DIAGNOSIS — Z9689 Presence of other specified functional implants: Secondary | ICD-10-CM

## 2019-09-19 DIAGNOSIS — G250 Essential tremor: Secondary | ICD-10-CM

## 2019-09-19 DIAGNOSIS — E559 Vitamin D deficiency, unspecified: Secondary | ICD-10-CM

## 2019-09-19 MED ORDER — VIIBRYD 10 MG PO TABS: ORAL_TABLET | ORAL | 0 refills | Status: DC

## 2019-09-19 MED ORDER — PRIMIDONE 50 MG PO TABS: 100.0000 mg | ORAL_TABLET | Freq: Every day | ORAL | 1 refills | Status: DC

## 2019-09-19 MED ORDER — ALPRAZOLAM 2 MG PO TABS: 2.0000 mg | ORAL_TABLET | Freq: Every evening | ORAL | 1 refills | Status: DC | PRN

## 2019-09-19 MED FILL — ALPRAZolam 2 MG TABS: 2 | 30 days supply | Qty: 60 | Fill #0

## 2019-09-19 MED FILL — PRIMIDONE 50 MG TABLET: 50 | 90 days supply | Qty: 180 | Fill #0

## 2019-09-19 MED FILL — VIIBRYD 10-20 MG STARTER PA: 10 & 20 | 30 days supply | Qty: 30 | Fill #0

## 2019-09-19 NOTE — Assessment & Plan Note (Signed)
Will see if hotflashes, brain fog and fatigue improved with changing her Effexor to Viibyrd.  If not could consider HRT, though has a fam hx fo BrCa.  
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
## 2019-09-19 NOTE — Assessment & Plan Note (Signed)
Doing quite well on 2 tabs of primidone.  Prescription updated and new prescription sent to pharmacy.

## 2019-09-19 NOTE — Assessment & Plan Note (Signed)
New rx sent for Xanax

## 2019-09-19 NOTE — Assessment & Plan Note (Signed)
Followed at [facility] Pain management.  A pain stimulator in place. Sees Deanna. Will place a new referral. Was previously on Nucynta.

## 2019-09-20 ENCOUNTER — Other Ambulatory Visit: Payer: Self-pay | Admitting: Family Medicine

## 2019-09-20 MED ORDER — VENLAFAXINE HCL ER 37.5 MG PO CP24: ORAL_CAPSULE | ORAL | 0 refills | Status: DC

## 2019-09-20 MED FILL — VENLAFAXINE HCL ER 37.5 MG: 37.5 | 30 days supply | Qty: 33 | Fill #0

## 2019-09-20 NOTE — Progress Notes (Signed)
Will send over taper to change to Viibyrd

## 2019-09-26 ENCOUNTER — Telehealth: Payer: Self-pay

## 2019-09-26 NOTE — Telephone Encounter (Addendum)
Received fax from Dr Lauris Poag office, they declined to accept patient's referral. This was only pain management in [facility] network, so Single Case Agreement has been initiated with [facility] so she can continue to see previous pain clinic provider, [employer].   Per [facility], letter must be written by Dr Madilyn Fireman stating why patient needs to be seen by this provider, why they aren't being seen by a provider in network, what conditions are being treated, and clinical notes from pain med need to be included.  [facility] Fax: [phone] Call ref # [phone]

## 2019-09-26 NOTE — Telephone Encounter (Signed)
Dr Madilyn Fireman,   [name] has signed release for last office note from pain specialist for me to fax to insurance along with her referral rejection, last OV with PCP, and letter that is attached.   Please review pended letter and make any adjustments you see necessary.   Thanks!

## 2019-09-27 NOTE — Telephone Encounter (Signed)
Thank you, letter printed.  Will sign and place in you basket.

## 2019-09-30 NOTE — Telephone Encounter (Signed)
Letter, rejection note from recent referral, and provider notes from usually pain doctor faxed to [facility].

## 2019-10-14 ENCOUNTER — Encounter: Payer: Self-pay | Admitting: Family Medicine

## 2019-10-14 ENCOUNTER — Ambulatory Visit (INDEPENDENT_AMBULATORY_CARE_PROVIDER_SITE_OTHER): Payer: No Typology Code available for payment source | Admitting: Family Medicine

## 2019-10-14 VITALS — BP 120/71 | HR 81 | Ht 65.0 in | Wt 137.0 lb

## 2019-10-14 DIAGNOSIS — M999 Biomechanical lesion, unspecified: Secondary | ICD-10-CM | POA: Insufficient documentation

## 2019-10-14 DIAGNOSIS — M62838 Other muscle spasm: Secondary | ICD-10-CM | POA: Diagnosis not present

## 2019-10-14 DIAGNOSIS — M9901 Segmental and somatic dysfunction of cervical region: Secondary | ICD-10-CM | POA: Diagnosis not present

## 2019-10-14 MED ORDER — CYCLOBENZAPRINE HCL 10 MG PO TABS: 5.0000 mg | ORAL_TABLET | Freq: Three times a day (TID) | ORAL | 0 refills | Status: DC | PRN

## 2019-10-14 MED FILL — CYCLOBENZAPRINE HCL 10 MG T: 10 | 10 days supply | Qty: 30 | Fill #0

## 2019-10-14 NOTE — Assessment & Plan Note (Signed)
OMT performed, she tolerated this well. See procedure note.  Recommend f/u tx as needed.
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
## 2019-10-14 NOTE — Assessment & Plan Note (Signed)
In addition to OMT treatment today will add on flexeril.

## 2019-10-16 ENCOUNTER — Other Ambulatory Visit: Payer: Self-pay | Admitting: *Deleted

## 2019-10-16 MED ORDER — XANAX 2 MG PO TABS: 2.0000 mg | ORAL_TABLET | Freq: Every evening | ORAL | 2 refills | Status: DC | PRN

## 2019-10-16 MED ORDER — SUMATRIPTAN SUCCINATE 50 MG PO TABS: ORAL_TABLET | ORAL | 3 refills | Status: DC

## 2019-10-16 MED ORDER — OMEPRAZOLE 40 MG PO CPDR: 40.0000 mg | DELAYED_RELEASE_CAPSULE | Freq: Every morning | ORAL | 3 refills | Status: DC

## 2019-10-16 NOTE — Telephone Encounter (Signed)
Pt prefers brand only Xanax

## 2019-10-17 ENCOUNTER — Telehealth: Payer: Self-pay | Admitting: *Deleted

## 2019-10-17 ENCOUNTER — Telehealth: Payer: Self-pay | Admitting: Family Medicine

## 2019-10-17 ENCOUNTER — Other Ambulatory Visit: Payer: Self-pay | Admitting: *Deleted

## 2019-10-17 DIAGNOSIS — F349 Persistent mood [affective] disorder, unspecified: Secondary | ICD-10-CM

## 2019-10-17 MED ORDER — XANAX 2 MG PO TABS: 2.0000 mg | ORAL_TABLET | Freq: Every evening | ORAL | 0 refills | Status: DC | PRN

## 2019-10-17 MED ORDER — XANAX 2 MG PO TABS: 2.0000 mg | ORAL_TABLET | Freq: Every evening | ORAL | 2 refills | Status: DC | PRN

## 2019-10-17 MED FILL — SUMATRIPTAN SUCC 50 MG TAB: 50 | 30 days supply | Qty: 18 | Fill #0

## 2019-10-17 MED FILL — OMEPRAZOLE 40 MG CPDR: 40 | 90 days supply | Qty: 90 | Fill #0

## 2019-10-17 NOTE — Telephone Encounter (Signed)
Received fax for PA on Xanax sent through cover my meds waiting on determination. - CF

## 2019-10-17 NOTE — Telephone Encounter (Signed)
Pt called and stated that [facility] doesn't have the Xanax 2 mg and they won't be in until April 2. She wanted to know if this can be sent to [facility] in [place]

## 2019-10-17 NOTE — Telephone Encounter (Signed)
Patient called.  Prescription brand only Xanax will require prior authorization so she would like it sent to her local pharmacy since she is not going to be in the [place] area.  So new prescription sent to [place] we will still try to move forward with prior authorization but in the meantime she is going to pay cash.

## 2019-10-18 ENCOUNTER — Other Ambulatory Visit: Payer: Self-pay | Admitting: *Deleted

## 2019-10-18 MED FILL — ALPRAZolam 2 MG TABS: 2 | 30 days supply | Qty: 60 | Fill #1

## 2019-10-22 IMAGING — DX DG CHEST 2V
2 series · 2 of 2 positions shown · non-contrast
Comparison: August 29, 2016.

CLINICAL DATA: Cough and chest pain

EXAM:
CHEST - 2 VIEW

[chest pa]
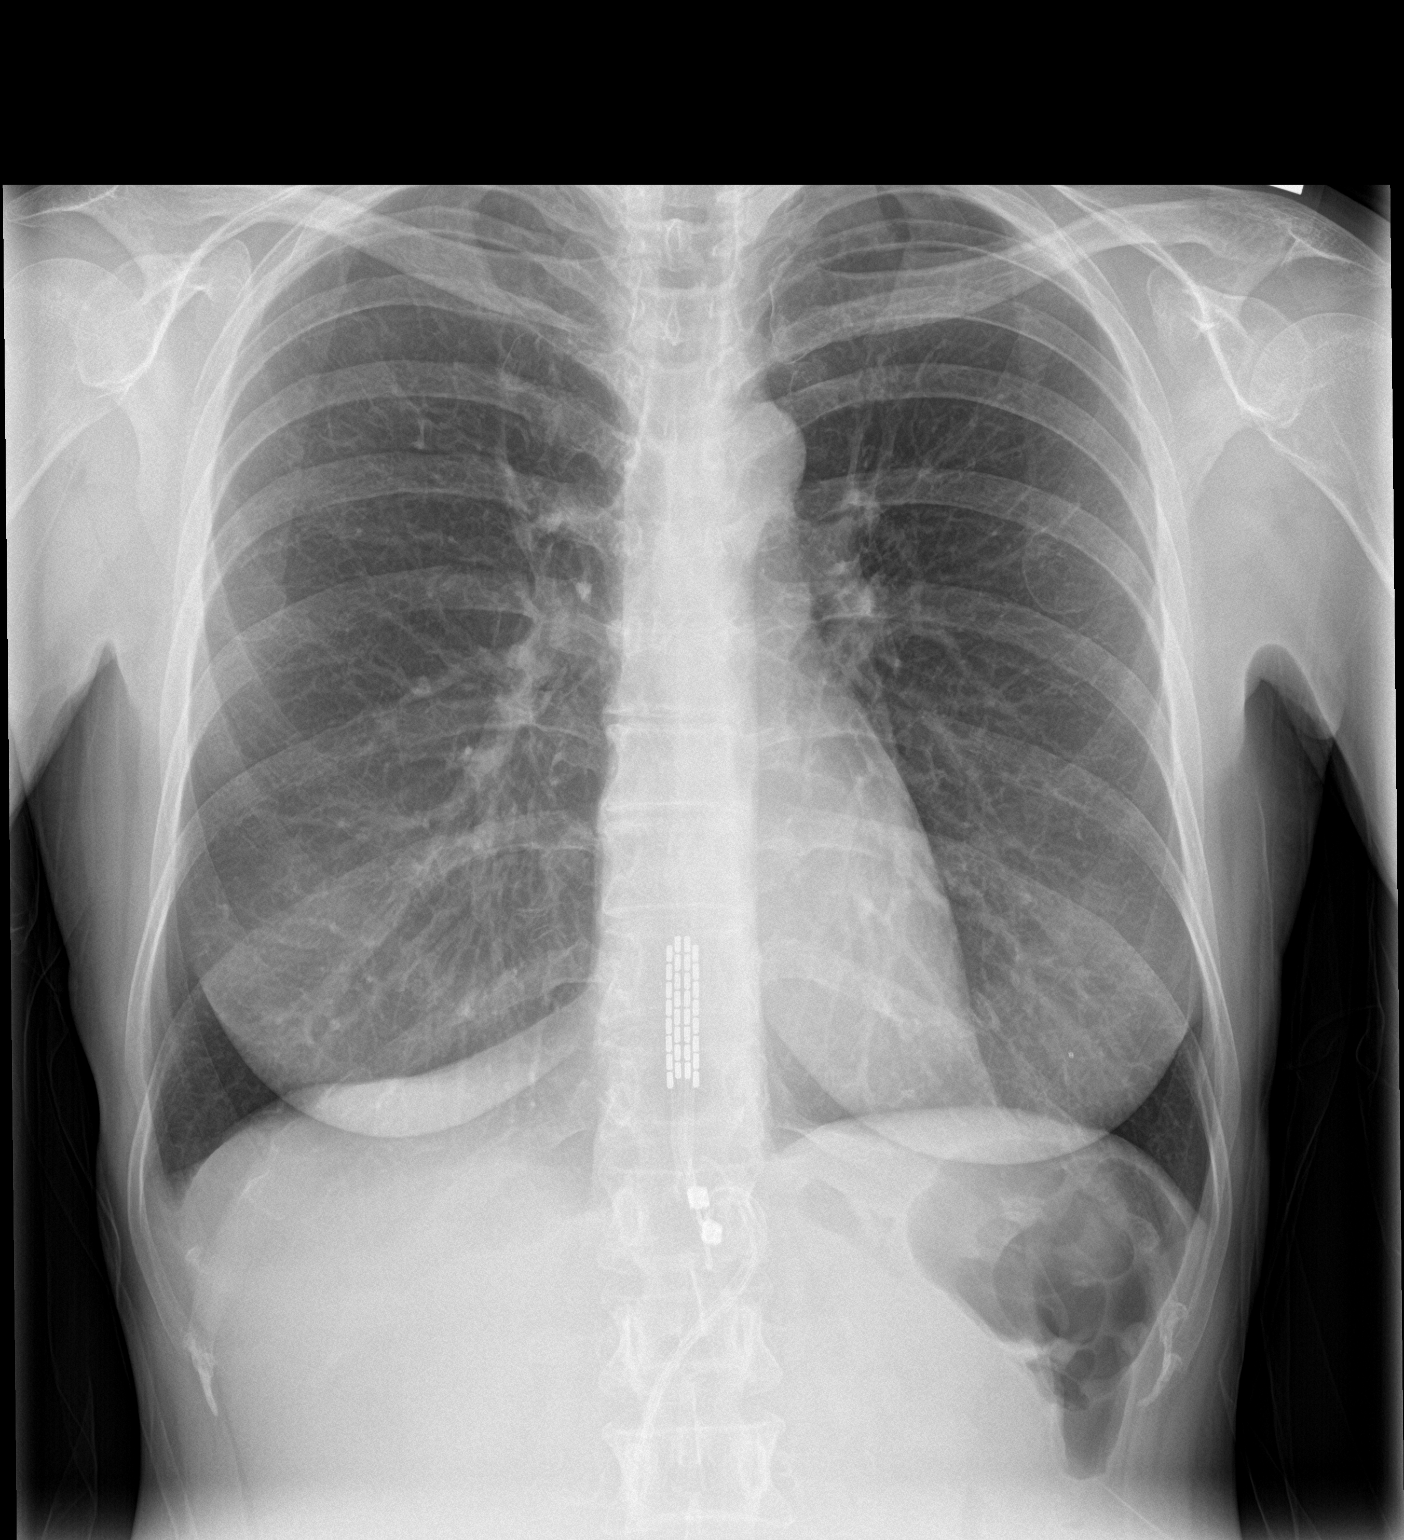

[chest lat]
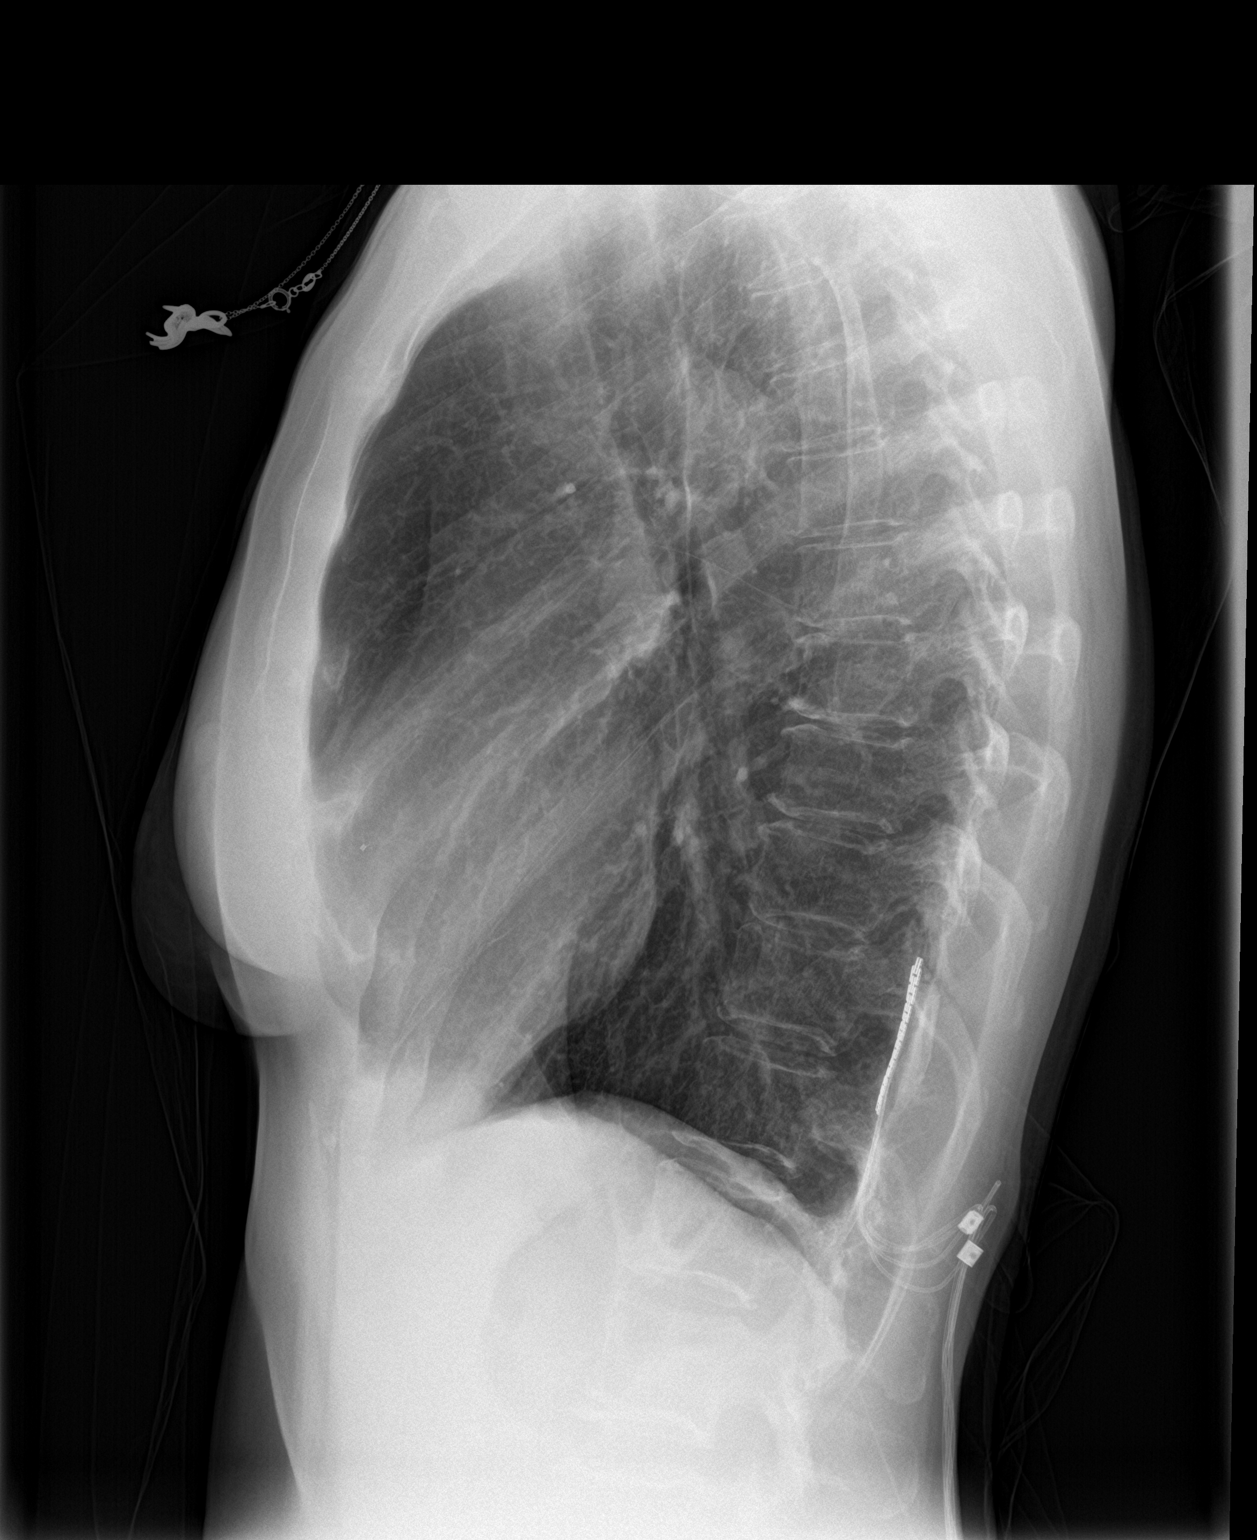

[2 of 2 positions shown; findings below may reference images not displayed]

FINDINGS: There is no edema or consolidation. The heart size and pulmonary
vascularity are normal. No adenopathy. Thoracic stimulator leads are
in the lower thoracic region. There is postoperative change in the
lower cervical spine.
IMPRESSION: No edema or consolidation.

## 2019-10-22 NOTE — Telephone Encounter (Signed)
Received fax from [facility] stating a PA is not required due to medication is a covered benefit.   PA Reference # [id number] - CF

## 2019-10-29 ENCOUNTER — Encounter: Payer: Self-pay | Admitting: Family Medicine

## 2019-10-30 MED ORDER — VIIBRYD 20 MG PO TABS: 1.0000 | ORAL_TABLET | Freq: Every day | ORAL | 0 refills | Status: DC

## 2019-10-30 MED FILL — VIIBRYD 20 MG TABLET: 20 | 90 days supply | Qty: 90 | Fill #0

## 2019-11-07 ENCOUNTER — Encounter: Payer: Self-pay | Admitting: Family Medicine

## 2019-11-07 NOTE — Telephone Encounter (Signed)
Routing to provider  

## 2019-11-08 MED ORDER — TAPENTADOL HCL 50 MG PO TABS: 50.0000 mg | ORAL_TABLET | Freq: Three times a day (TID) | ORAL | 0 refills | Status: DC | PRN

## 2019-11-08 NOTE — Telephone Encounter (Signed)
Routing to provider  

## 2019-11-11 ENCOUNTER — Other Ambulatory Visit: Payer: Self-pay

## 2019-11-11 ENCOUNTER — Telehealth (INDEPENDENT_AMBULATORY_CARE_PROVIDER_SITE_OTHER): Payer: No Typology Code available for payment source | Admitting: Family Medicine

## 2019-11-11 VITALS — Wt 149.0 lb

## 2019-11-11 DIAGNOSIS — M999 Biomechanical lesion, unspecified: Secondary | ICD-10-CM | POA: Diagnosis not present

## 2019-11-11 DIAGNOSIS — K5901 Slow transit constipation: Secondary | ICD-10-CM | POA: Diagnosis not present

## 2019-11-11 DIAGNOSIS — G8929 Other chronic pain: Secondary | ICD-10-CM

## 2019-11-11 DIAGNOSIS — G90529 Complex regional pain syndrome I of unspecified lower limb: Secondary | ICD-10-CM

## 2019-11-11 MED ORDER — OXYCODONE-ACETAMINOPHEN 10-325 MG PO TABS: 1.0000 | ORAL_TABLET | Freq: Three times a day (TID) | ORAL | 0 refills | Status: DC | PRN

## 2019-11-11 MED ORDER — LUBIPROSTONE 24 MCG PO CAPS: 24.0000 ug | ORAL_CAPSULE | Freq: Two times a day (BID) | ORAL | 5 refills | Status: DC

## 2019-11-11 MED FILL — OXYCODONE-APAP 10-325: 10-325 | 30 days supply | Qty: 90 | Fill #0

## 2019-11-11 NOTE — Progress Notes (Signed)
LVM for patient callback concerning switching meds from Linzess to Amitiza

## 2019-11-11 NOTE — Progress Notes (Signed)
Virtual Visit via Video Note  I connected with [name] on 11/11/19 at  8:10 AM EDT by a video enabled telemedicine application and verified that I am speaking with the correct person using two identifiers.   I discussed the limitations of evaluation and management by telemedicine and the availability of in person appointments. The patient expressed understanding and agreed to proceed.  Subjective:    CC: Pain Management.    HPI: 49 year old female is doing virtual visit today to discuss pain management.  She was previously being seen at [facility] and has a stimulator in place.  Because of her insurance she has to stay in network and so we had originally referred her to the only in network pain management provider in [place] but they declined to take her on as a new patient so she is stuck because she has to be seen in network or she will have to pay 100% out-of-pocket.  She did call [facility] pain [facility] to find out if she could pay out-of-pocket or get on a payment plan and they said they do cash only payments from for their visits.  She was previously on Nucynta but with her new insurance it is $150 for 30-day supply and so would like to switch medications back to oxycodone which worked "OK".     Past medical history, Surgical history, Family history not pertinant except as noted below, Social history, Allergies, and medications have been entered into the medical record, reviewed, and corrections made.   Review of Systems: No fevers, chills, night sweats, weight loss, chest pain, or shortness of breath.   Objective:    General: Speaking clearly in complete sentences without any shortness of breath.  Alert and oriented x3.  Normal judgment. No apparent acute distress.    Impression and Recommendations:    Constipation He would like to restart Linzess since she is going to be back on medication.  She does have chronic constipation at baseline.  Unfortunately  it looks like the Linzess does not go to be covered but she is okay with Amitiza.  New prescription sent to pharmacy.  Follow-up in 1 month.  Encounter for chronic pain management We will go ahead and switch to oxycodone.  New prescription sent to pharmacy we will follow-up in 3 months and at that point she can come in in person and we can get everything switched over for pain management contract and opioid risk score completed.  We will also go ahead and get her on something for constipation.  Remove Nucynta from medication list.  Will likely need to cover her until January of next year when she is able to switch insurance products.  CRPS (complex regional pain syndrome) type I of lower limb See below.        Time spent in encounter 20 minutes  I discussed the assessment and treatment plan with the patient. The patient was provided an opportunity to ask questions and all were answered. The patient agreed with the plan and demonstrated an understanding of the instructions.   The patient was advised to call back or seek an in-person evaluation if the symptoms worsen or if the condition fails to improve as anticipated.   Beatrice Lecher, MD

## 2019-11-11 NOTE — Progress Notes (Signed)
Patient called [facility] and also spoke with [facility] Benefits Department and she was told that her pain management referral was denied. Not able to see her pain provider because they do not accept cash paying patients. The only pain provider that is in network has refused her referral. Wants to discuss options from here.Marland Kitchen

## 2019-11-11 NOTE — Assessment & Plan Note (Signed)
We will go ahead and switch to oxycodone.  New prescription sent to pharmacy we will follow-up in 3 months and at that point she can come in in person and we can get everything switched over for pain management contract and opioid risk score completed.  We will also go ahead and get her on something for constipation.  Remove Nucynta from medication list.  Will likely need to cover her until January of next year when she is able to switch insurance products.

## 2019-11-11 NOTE — Assessment & Plan Note (Signed)
See below

## 2019-11-11 NOTE — Assessment & Plan Note (Signed)
He would like to restart Linzess since she is going to be back on medication.  She does have chronic constipation at baseline.  Unfortunately it looks like the Linzess does not go to be covered but she is okay with Amitiza.  New prescription sent to pharmacy.  Follow-up in 1 month.

## 2019-11-12 ENCOUNTER — Telehealth: Payer: Self-pay | Admitting: Family Medicine

## 2019-11-12 NOTE — Telephone Encounter (Signed)
Received fax for PA on Amitiza sent through cover my meds waiting on determination. - CF

## 2019-11-13 ENCOUNTER — Telehealth: Payer: Self-pay

## 2019-11-13 ENCOUNTER — Encounter: Payer: Self-pay | Admitting: Family Medicine

## 2019-11-13 NOTE — Telephone Encounter (Signed)
The oxy is in place of the [place] which was too expensive.

## 2019-11-13 NOTE — Telephone Encounter (Signed)
The pharmacy called to confirm [name] should be on Nucynta 50 mg TID # 90, Percocet 10-325 mg and Xanax 2 mg. Please advise.

## 2019-11-14 MED ORDER — XANAX 2 MG PO TABS: 2.0000 mg | ORAL_TABLET | Freq: Every evening | ORAL | 0 refills | Status: DC | PRN

## 2019-11-15 ENCOUNTER — Telehealth: Payer: Self-pay

## 2019-11-15 MED FILL — ALPRAZolam 2 MG TABS: 2 | 30 days supply | Qty: 60 | Fill #0

## 2019-11-15 NOTE — Telephone Encounter (Signed)
K to give verbal to changed to the generic

## 2019-11-15 NOTE — Telephone Encounter (Signed)
[name] can not afford the Xanax Brand. She is going to run out this Saturday. She needs a new prescription for a generic.

## 2019-11-15 NOTE — Telephone Encounter (Signed)
Gave verbal orders to change Xanax to generic.

## 2019-11-18 NOTE — Telephone Encounter (Signed)
All prescriptions are now with [facility].

## 2019-11-19 ENCOUNTER — Other Ambulatory Visit: Payer: Self-pay | Admitting: *Deleted

## 2019-11-26 MED FILL — AMITIZA 24 MCG CAPSULES: 24 | 30 days supply | Qty: 60 | Fill #0

## 2019-11-26 NOTE — Telephone Encounter (Signed)
Received fax from [facility] and they approved coverage of Amitiza.   PA Ref Number: [id number] Valid: 11/25/19 - 11/23/20 - CF

## 2019-12-10 ENCOUNTER — Other Ambulatory Visit: Payer: Self-pay

## 2019-12-10 ENCOUNTER — Encounter: Payer: Self-pay | Admitting: Family Medicine

## 2019-12-10 ENCOUNTER — Ambulatory Visit (INDEPENDENT_AMBULATORY_CARE_PROVIDER_SITE_OTHER): Payer: No Typology Code available for payment source | Admitting: Family Medicine

## 2019-12-10 VITALS — BP 121/72 | HR 58 | Ht 65.0 in | Wt 146.0 lb

## 2019-12-10 DIAGNOSIS — F988 Other specified behavioral and emotional disorders with onset usually occurring in childhood and adolescence: Secondary | ICD-10-CM

## 2019-12-10 DIAGNOSIS — R42 Dizziness and giddiness: Secondary | ICD-10-CM | POA: Diagnosis not present

## 2019-12-10 DIAGNOSIS — R110 Nausea: Secondary | ICD-10-CM

## 2019-12-10 DIAGNOSIS — G8929 Other chronic pain: Secondary | ICD-10-CM | POA: Diagnosis not present

## 2019-12-10 DIAGNOSIS — R5383 Other fatigue: Secondary | ICD-10-CM

## 2019-12-10 DIAGNOSIS — R109 Unspecified abdominal pain: Secondary | ICD-10-CM

## 2019-12-10 MED ORDER — XANAX 2 MG PO TABS: 2.0000 mg | ORAL_TABLET | Freq: Every evening | ORAL | 2 refills | Status: DC | PRN

## 2019-12-10 NOTE — Assessment & Plan Note (Signed)
Doing well on current regimen. Doesn't need a refill today.

## 2019-12-10 NOTE — Assessment & Plan Note (Signed)
Opioid risk tool completed.  Pain contract also completed.  Ports that her pain regimen is working well.  She does not take it before going to work because she does not want to be impaired.  So she usually takes the medication when she gets home.  She says it really has helped to keep her functioning.
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
## 2019-12-12 ENCOUNTER — Ambulatory Visit (INDEPENDENT_AMBULATORY_CARE_PROVIDER_SITE_OTHER): Payer: No Typology Code available for payment source

## 2019-12-12 ENCOUNTER — Other Ambulatory Visit: Payer: Self-pay

## 2019-12-12 DIAGNOSIS — R42 Dizziness and giddiness: Secondary | ICD-10-CM | POA: Diagnosis not present

## 2019-12-12 DIAGNOSIS — R109 Unspecified abdominal pain: Secondary | ICD-10-CM | POA: Diagnosis not present

## 2019-12-12 DIAGNOSIS — R110 Nausea: Secondary | ICD-10-CM | POA: Diagnosis not present

## 2019-12-13 ENCOUNTER — Encounter: Payer: Self-pay | Admitting: Family Medicine

## 2019-12-13 DIAGNOSIS — R109 Unspecified abdominal pain: Secondary | ICD-10-CM

## 2019-12-13 DIAGNOSIS — N281 Cyst of kidney, acquired: Secondary | ICD-10-CM

## 2019-12-13 MED FILL — ALPRAZolam 2 MG TABS: 2 | 30 days supply | Qty: 60 | Fill #0

## 2019-12-14 LAB — CBC WITH DIFFERENTIAL/PLATELET
Absolute Monocytes: 575 cells/uL (ref 200–950)
Basophils Absolute: 41 cells/uL (ref 0–200)
Basophils Relative: 0.5 %
Eosinophils Absolute: 130 cells/uL (ref 15–500)
Eosinophils Relative: 1.6 %
HCT: 40.1 % (ref 35.0–45.0)
Hemoglobin: 13.6 g/dL (ref 11.7–15.5)
Lymphs Abs: 2163 cells/uL (ref 850–3900)
MCH: 29.6 pg (ref 27.0–33.0)
MCHC: 33.9 g/dL (ref 32.0–36.0)
MCV: 87.2 fL (ref 80.0–100.0)
MPV: 10 fL (ref 7.5–12.5)
Monocytes Relative: 7.1 %
Neutro Abs: 5192 cells/uL (ref 1500–7800)
Neutrophils Relative %: 64.1 %
Platelets: 352 10*3/uL (ref 140–400)
RBC: 4.6 10*6/uL (ref 3.80–5.10)
RDW: 12.1 % (ref 11.0–15.0)
Total Lymphocyte: 26.7 %
WBC: 8.1 10*3/uL (ref 3.8–10.8)

## 2019-12-14 LAB — DRUG MONITOR, PANEL 5, W/CONF, W/DL, URINE
Alphahydroxyalprazolam: 100 ng/mL — ABNORMAL HIGH (ref <25)
Alphahydroxymidazolam: NEGATIVE ng/mL (ref <50)
Alphahydroxytriazolam: NEGATIVE ng/mL (ref <50)
Aminoclonazepam: NEGATIVE ng/mL (ref <25)
Amphetamine: 1346 ng/mL — ABNORMAL HIGH (ref <250)
Amphetamines: POSITIVE ng/mL — AB (ref <500)
Barbiturates: NEGATIVE ng/mL (ref <300)
Benzodiazepines: POSITIVE ng/mL — AB (ref <100)
Cocaine Metabolite: NEGATIVE ng/mL (ref <150)
Creatinine: 24.3 mg/dL
Hydroxyethylflurazepam: NEGATIVE ng/mL (ref <50)
Lorazepam: NEGATIVE ng/mL (ref <50)
Marijuana Metabolite: NEGATIVE ng/mL (ref <20)
Methadone Metabolite: NEGATIVE ng/mL (ref <100)
Methamphetamine: NEGATIVE ng/mL (ref <250)
Nordiazepam: NEGATIVE ng/mL (ref <50)
Noroxycodone: 861 ng/mL — ABNORMAL HIGH (ref <50)
Opiates: NEGATIVE ng/mL (ref <100)
Oxazepam: NEGATIVE ng/mL (ref <50)
Oxidant: NEGATIVE ug/mL
Oxycodone: NEGATIVE ng/mL (ref <50)
Oxycodone: POSITIVE ng/mL — AB (ref <100)
Oxymorphone: 74 ng/mL — ABNORMAL HIGH (ref <50)
Temazepam: NEGATIVE ng/mL (ref <50)
pH: 6.3 (ref 4.5–9.0)

## 2019-12-14 LAB — COMPLETE METABOLIC PANEL WITH GFR
AG Ratio: 1.9 (calc) (ref 1.0–2.5)
ALT: 16 U/L (ref 6–29)
AST: 17 U/L (ref 10–35)
Albumin: 4.5 g/dL (ref 3.6–5.1)
Alkaline phosphatase (APISO): 92 U/L (ref 37–153)
BUN: 7 mg/dL (ref 7–25)
CO2: 31 mmol/L (ref 20–32)
Calcium: 9.7 mg/dL (ref 8.6–10.4)
Chloride: 101 mmol/L (ref 98–110)
Creat: 0.73 mg/dL (ref 0.50–1.05)
GFR, Est African American: 111 mL/min/{1.73_m2} (ref >=60)
GFR, Est Non African American: 96 mL/min/{1.73_m2} (ref >=60)
Globulin: 2.4 g/dL (calc) (ref 1.9–3.7)
Glucose, Bld: 148 mg/dL — ABNORMAL HIGH (ref 65–99)
Potassium: 4.5 mmol/L (ref 3.5–5.3)
Sodium: 139 mmol/L (ref 135–146)
Total Bilirubin: 0.4 mg/dL (ref 0.2–1.2)
Total Protein: 6.9 g/dL (ref 6.1–8.1)

## 2019-12-14 LAB — TSH: TSH: 0.87 mIU/L

## 2019-12-14 LAB — LIPASE: Lipase: 14 U/L (ref 7–60)

## 2019-12-14 LAB — DM TEMPLATE

## 2019-12-16 ENCOUNTER — Encounter: Payer: Self-pay | Admitting: Family Medicine

## 2019-12-16 DIAGNOSIS — M999 Biomechanical lesion, unspecified: Secondary | ICD-10-CM

## 2019-12-16 DIAGNOSIS — G8929 Other chronic pain: Secondary | ICD-10-CM

## 2019-12-16 DIAGNOSIS — G90529 Complex regional pain syndrome I of unspecified lower limb: Secondary | ICD-10-CM

## 2019-12-17 ENCOUNTER — Other Ambulatory Visit: Payer: No Typology Code available for payment source

## 2019-12-17 MED ORDER — OXYCODONE-ACETAMINOPHEN 10-325 MG PO TABS: 1.0000 | ORAL_TABLET | Freq: Three times a day (TID) | ORAL | 0 refills | Status: DC | PRN

## 2019-12-17 MED FILL — OXYCODONE-ACETAMINOPHEN 10-: 10-325 | 30 days supply | Qty: 90 | Fill #0

## 2019-12-17 NOTE — Telephone Encounter (Signed)
Med sent. Not sure why dropped off med list

## 2019-12-30 ENCOUNTER — Ambulatory Visit (HOSPITAL_COMMUNITY)
Admission: RE | Admit: 2019-12-30 | Discharge: 2019-12-30 | Disposition: A | Discharge status: Home / Self Care | Payer: No Typology Code available for payment source | Source: Ambulatory Visit | Attending: Family Medicine | Admitting: Family Medicine

## 2019-12-30 ENCOUNTER — Other Ambulatory Visit: Payer: Self-pay

## 2019-12-30 ENCOUNTER — Encounter: Payer: Self-pay | Admitting: Family Medicine

## 2019-12-30 DIAGNOSIS — R109 Unspecified abdominal pain: Secondary | ICD-10-CM | POA: Insufficient documentation

## 2019-12-30 MED ORDER — TECHNETIUM TC 99M MEBROFENIN IV KIT
5.0000 | PACK | Freq: Once | INTRAVENOUS | Status: AC | PRN
  Administered 2019-12-30: 5 via INTRAVENOUS

## 2020-01-02 ENCOUNTER — Other Ambulatory Visit: Payer: Self-pay

## 2020-01-02 ENCOUNTER — Ambulatory Visit
Admission: RE | Admit: 2020-01-02 | Discharge: 2020-01-02 | Disposition: A | Discharge status: Home / Self Care | Payer: No Typology Code available for payment source | Source: Ambulatory Visit | Attending: Family Medicine | Admitting: Family Medicine

## 2020-01-02 DIAGNOSIS — N281 Cyst of kidney, acquired: Secondary | ICD-10-CM

## 2020-01-02 MED ORDER — IOPAMIDOL (ISOVUE-300) INJECTION 61%
100.0000 mL | Freq: Once | INTRAVENOUS | Status: AC | PRN
  Administered 2020-01-02: 100 mL via INTRAVENOUS

## 2020-01-09 ENCOUNTER — Encounter: Payer: Self-pay | Admitting: Family Medicine

## 2020-01-09 ENCOUNTER — Other Ambulatory Visit: Payer: Self-pay | Admitting: Family Medicine

## 2020-01-09 MED ORDER — ACYCLOVIR 400 MG PO TABS: ORAL_TABLET | ORAL | 2 refills | Status: DC

## 2020-01-09 MED ORDER — SUMATRIPTAN SUCCINATE 50 MG PO TABS: ORAL_TABLET | ORAL | 3 refills | Status: DC

## 2020-01-09 MED ORDER — PRIMIDONE 50 MG PO TABS: 100.0000 mg | ORAL_TABLET | Freq: Every day | ORAL | 1 refills | Status: DC

## 2020-01-09 MED FILL — SUMAtriptan SUCCINATE 50 MG: 50 | 30 days supply | Qty: 18 | Fill #0

## 2020-01-09 MED FILL — PRIMIDONE 50 MG TABLET: 50 | 90 days supply | Qty: 180 | Fill #0

## 2020-01-09 MED FILL — ACYCLOVIR 400 MG TABLET: 400 | 30 days supply | Qty: 90 | Fill #0

## 2020-01-12 ENCOUNTER — Encounter: Payer: Self-pay | Admitting: Family Medicine

## 2020-01-12 DIAGNOSIS — R0781 Pleurodynia: Secondary | ICD-10-CM

## 2020-01-12 DIAGNOSIS — W19XXXA Unspecified fall, initial encounter: Secondary | ICD-10-CM

## 2020-01-12 DIAGNOSIS — R109 Unspecified abdominal pain: Secondary | ICD-10-CM

## 2020-01-13 MED FILL — ALPRAZolam 2 MG TABS: 2 | 30 days supply | Qty: 60 | Fill #1

## 2020-01-13 MED FILL — OMEPRAZOLE 40 MG CPDR: 40 | 90 days supply | Qty: 90 | Fill #0

## 2020-01-13 NOTE — Telephone Encounter (Signed)
Yes, order placed for rib films.

## 2020-01-13 NOTE — Telephone Encounter (Signed)
XR pended for ribs to be done at [place], [place] to order?

## 2020-01-14 ENCOUNTER — Other Ambulatory Visit: Payer: Self-pay

## 2020-01-14 ENCOUNTER — Ambulatory Visit (INDEPENDENT_AMBULATORY_CARE_PROVIDER_SITE_OTHER): Payer: No Typology Code available for payment source

## 2020-01-14 DIAGNOSIS — W19XXXA Unspecified fall, initial encounter: Secondary | ICD-10-CM

## 2020-01-14 DIAGNOSIS — R109 Unspecified abdominal pain: Secondary | ICD-10-CM | POA: Diagnosis not present

## 2020-01-14 DIAGNOSIS — R0781 Pleurodynia: Secondary | ICD-10-CM | POA: Diagnosis not present

## 2020-01-14 NOTE — Telephone Encounter (Signed)
Spoke with patient, order changed to [place]

## 2020-01-16 ENCOUNTER — Encounter: Payer: Self-pay | Admitting: Family Medicine

## 2020-01-31 NOTE — Telephone Encounter (Signed)
Has received call from GI to schedule an appt. she has a history of chronic constipation but has now been having diarrhea persistently with no formed stools in between.  No blood.  We are getting her established with [facility].  She was previously followed at [facility] but now has [place] insurance and we will be switching to Dr. Bryan Lemma.  Left foot is swollen and painful bc the stimulator won't hold a charge and has reached out to local rep. Doesn't think it is a blood clot. Hasn't tried her diuretic yet.  Recommend trying the diuretic just to see if it helps.  Also consider work-up for other causes such as DVT.

## 2020-02-02 ENCOUNTER — Encounter: Payer: Self-pay | Admitting: Family Medicine

## 2020-02-04 ENCOUNTER — Encounter: Payer: Self-pay | Admitting: Gastroenterology

## 2020-02-04 ENCOUNTER — Other Ambulatory Visit: Payer: Self-pay | Admitting: *Deleted

## 2020-02-04 DIAGNOSIS — G90529 Complex regional pain syndrome I of unspecified lower limb: Secondary | ICD-10-CM

## 2020-02-04 DIAGNOSIS — G8929 Other chronic pain: Secondary | ICD-10-CM

## 2020-02-04 DIAGNOSIS — M999 Biomechanical lesion, unspecified: Secondary | ICD-10-CM

## 2020-02-04 MED ORDER — OXYCODONE-ACETAMINOPHEN 10-325 MG PO TABS: 1.0000 | ORAL_TABLET | Freq: Three times a day (TID) | ORAL | 0 refills | Status: DC | PRN

## 2020-02-04 MED ORDER — VIIBRYD 20 MG PO TABS: 1.0000 | ORAL_TABLET | Freq: Every day | ORAL | 0 refills | Status: DC

## 2020-02-04 MED FILL — OXYCODONE-APAP 10-325: 10-325 | 30 days supply | Qty: 90 | Fill #0

## 2020-02-04 MED FILL — VIIBRYD 20 MG TABLET: 20 | 90 days supply | Qty: 90 | Fill #0

## 2020-02-04 NOTE — Telephone Encounter (Signed)
Pt sent my chart requesting refills for Viibryd and Oxycodone to be sent to [facility] in [place]

## 2020-02-12 MED FILL — ALPRAZolam 2 MG TABS: 2 | 30 days supply | Qty: 60 | Fill #2

## 2020-03-04 ENCOUNTER — Other Ambulatory Visit: Payer: Self-pay

## 2020-03-04 ENCOUNTER — Telehealth: Payer: Self-pay | Admitting: *Deleted

## 2020-03-04 MED ORDER — PROCHLORPERAZINE MALEATE 5 MG PO TABS: 5.0000 mg | ORAL_TABLET | Freq: Three times a day (TID) | ORAL | 1 refills | Status: DC

## 2020-03-04 MED ORDER — PROCHLORPERAZINE MALEATE 10 MG PO TABS: 5.0000 mg | ORAL_TABLET | Freq: Three times a day (TID) | ORAL | 0 refills | Status: DC

## 2020-03-04 MED FILL — PROCHLORPERAZINE 10 MG TAB: 10 | 90 days supply | Qty: 135 | Fill #0

## 2020-03-04 NOTE — Telephone Encounter (Signed)
received a fax stating that they don't have the prochlorperazine 5 mg table in stock and would like permission to change to 1/2 of the 10 mg.   Ok given to change to 10 mg until 5 mg comes back in stock.

## 2020-03-11 ENCOUNTER — Encounter: Payer: Self-pay | Admitting: Family Medicine

## 2020-03-11 MED ORDER — ALPRAZOLAM 2 MG PO TABS: 2.0000 mg | ORAL_TABLET | Freq: Every evening | ORAL | 1 refills | Status: DC | PRN

## 2020-03-13 MED FILL — SUMAtriptan SUCCINATE 50 MG: 50 | 30 days supply | Qty: 18 | Fill #1

## 2020-03-13 MED FILL — ALPRAZolam 2 MG TABS: 2 | 30 days supply | Qty: 60 | Fill #0

## 2020-03-16 ENCOUNTER — Encounter: Payer: Self-pay | Admitting: Family Medicine

## 2020-03-16 DIAGNOSIS — M999 Biomechanical lesion, unspecified: Secondary | ICD-10-CM

## 2020-03-16 DIAGNOSIS — G90529 Complex regional pain syndrome I of unspecified lower limb: Secondary | ICD-10-CM

## 2020-03-16 DIAGNOSIS — G8929 Other chronic pain: Secondary | ICD-10-CM

## 2020-03-16 MED ORDER — OXYCODONE-ACETAMINOPHEN 10-325 MG PO TABS
1.0000 | ORAL_TABLET | Freq: Three times a day (TID) | ORAL | 0 refills | Status: DC | PRN
Start: 2020-03-16 — End: 2020-04-28

## 2020-03-16 MED FILL — OXYCODONE-APAP 10-325: 10-325 | 30 days supply | Qty: 90 | Fill #0

## 2020-03-16 NOTE — Telephone Encounter (Signed)
Filled until apppt

## 2020-03-17 ENCOUNTER — Ambulatory Visit (INDEPENDENT_AMBULATORY_CARE_PROVIDER_SITE_OTHER): Payer: No Typology Code available for payment source | Admitting: Family Medicine

## 2020-03-17 ENCOUNTER — Other Ambulatory Visit: Payer: Self-pay

## 2020-03-17 ENCOUNTER — Encounter: Payer: Self-pay | Admitting: Family Medicine

## 2020-03-17 VITALS — BP 134/74 | HR 69 | Ht 65.0 in | Wt 142.0 lb

## 2020-03-17 DIAGNOSIS — Z1231 Encounter for screening mammogram for malignant neoplasm of breast: Secondary | ICD-10-CM

## 2020-03-17 DIAGNOSIS — F988 Other specified behavioral and emotional disorders with onset usually occurring in childhood and adolescence: Secondary | ICD-10-CM | POA: Diagnosis not present

## 2020-03-17 DIAGNOSIS — E559 Vitamin D deficiency, unspecified: Secondary | ICD-10-CM | POA: Diagnosis not present

## 2020-03-17 DIAGNOSIS — G8929 Other chronic pain: Secondary | ICD-10-CM

## 2020-03-17 DIAGNOSIS — M7989 Other specified soft tissue disorders: Secondary | ICD-10-CM

## 2020-03-17 DIAGNOSIS — G90522 Complex regional pain syndrome I of left lower limb: Secondary | ICD-10-CM

## 2020-03-17 DIAGNOSIS — R790 Abnormal level of blood mineral: Secondary | ICD-10-CM

## 2020-03-17 DIAGNOSIS — F339 Major depressive disorder, recurrent, unspecified: Secondary | ICD-10-CM

## 2020-03-17 NOTE — Addendum Note (Signed)
Addended by: Beatrice Lecher D on: 03/17/2020 04:59 PM   Modules accepted: Orders

## 2020-03-17 NOTE — Assessment & Plan Note (Signed)
He does have a prescription for furosemide but says she rarely uses it.  The swelling is a little worse on the left compared to the right where she has CRPS.  Did encourage her to wear compression stockings she says she usually does in the winter months

## 2020-03-17 NOTE — Assessment & Plan Note (Signed)
Continue with Viibryd.

## 2020-03-17 NOTE — Assessment & Plan Note (Signed)
Okay to refill regimen when patient requests.

## 2020-03-17 NOTE — Assessment & Plan Note (Signed)
Hopefully when she has new health insurance in January we can get her back in a [place] [facility] so that they can further investigate her stimulator to see if it may actually need to be replaced or at a minimum the battery pack.

## 2020-03-17 NOTE — Assessment & Plan Note (Addendum)
Indication for chronic opioid: Complex regional pain syndrome Medication and dose: Percocet 10/325 mg # pills per month: 90 Last UDS date: 11/2019 Opioid Treatment Agreement signed (Y/N): Y Opioid Treatment Agreement last reviewed with patient:  11/2019 [facility] reviewed this encounter (include red flags):  Yes F/U : 3 mo 
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
## 2020-03-17 NOTE — Assessment & Plan Note (Signed)
Due to recheck levels 

## 2020-03-19 LAB — DRUG MONITORING, PANEL 6 WITH CONFIRMATION, URINE
6 Acetylmorphine: NEGATIVE ng/mL (ref <10)
Alcohol Metabolites: NEGATIVE ng/mL
Alphahydroxyalprazolam: 114 ng/mL — ABNORMAL HIGH (ref <25)
Alphahydroxymidazolam: NEGATIVE ng/mL (ref <50)
Alphahydroxytriazolam: NEGATIVE ng/mL (ref <50)
Aminoclonazepam: NEGATIVE ng/mL (ref <25)
Amphetamine: 494 ng/mL — ABNORMAL HIGH (ref <250)
Amphetamines: POSITIVE ng/mL — AB (ref <500)
Barbiturates: NEGATIVE ng/mL (ref <300)
Benzodiazepines: POSITIVE ng/mL — AB (ref <100)
Cocaine Metabolite: NEGATIVE ng/mL (ref <150)
Creatinine: 19.1 mg/dL
Hydroxyethylflurazepam: NEGATIVE ng/mL (ref <50)
Lorazepam: NEGATIVE ng/mL (ref <50)
Marijuana Metabolite: NEGATIVE ng/mL (ref <20)
Methadone Metabolite: NEGATIVE ng/mL (ref <100)
Methamphetamine: NEGATIVE ng/mL (ref <250)
Nordiazepam: NEGATIVE ng/mL (ref <50)
Opiates: NEGATIVE ng/mL (ref <100)
Oxazepam: NEGATIVE ng/mL (ref <50)
Oxidant: NEGATIVE ug/mL
Oxycodone: NEGATIVE ng/mL (ref <100)
Phencyclidine: NEGATIVE ng/mL (ref <25)
Specific Gravity: 1.004 (ref >=1.0)
Temazepam: NEGATIVE ng/mL (ref <50)
pH: 7.5 (ref 4.5–9.0)

## 2020-03-19 LAB — DM TEMPLATE

## 2020-03-25 ENCOUNTER — Encounter: Payer: Self-pay | Admitting: Family Medicine

## 2020-03-25 ENCOUNTER — Ambulatory Visit: Payer: No Typology Code available for payment source | Admitting: Gastroenterology

## 2020-03-25 ENCOUNTER — Telehealth (INDEPENDENT_AMBULATORY_CARE_PROVIDER_SITE_OTHER): Payer: No Typology Code available for payment source | Admitting: Family Medicine

## 2020-03-25 ENCOUNTER — Telehealth: Payer: Self-pay | Admitting: Gastroenterology

## 2020-03-25 DIAGNOSIS — F430 Acute stress reaction: Secondary | ICD-10-CM | POA: Diagnosis not present
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
## 2020-03-25 NOTE — Telephone Encounter (Signed)
Pt cancelled appt 03/25/2020 with Dr Bryan Lemma due to being sick, pt states she will call back to rsx

## 2020-04-13 MED FILL — ALPRAZolam 2 MG TABS: 2 | 30 days supply | Qty: 60 | Fill #1

## 2020-04-13 MED FILL — PRIMIDONE 50 MG TABLET: 50 | 90 days supply | Qty: 180 | Fill #1

## 2020-04-13 MED FILL — OMEPRAZOLE 40 MG CPDR: 40 | 90 days supply | Qty: 90 | Fill #1

## 2020-04-23 MED FILL — FLUARIX QUADRIVALENT 0.5 ML: 0.5 | 1 days supply | Qty: 1 | Fill #0

## 2020-04-23 MED FILL — SUMAtriptan SUCCINATE 50 MG: 50 | 30 days supply | Qty: 18 | Fill #2

## 2020-04-28 ENCOUNTER — Other Ambulatory Visit: Payer: Self-pay | Admitting: Family Medicine

## 2020-04-28 ENCOUNTER — Encounter: Payer: Self-pay | Admitting: Family Medicine

## 2020-04-28 DIAGNOSIS — M999 Biomechanical lesion, unspecified: Secondary | ICD-10-CM

## 2020-04-28 DIAGNOSIS — G90529 Complex regional pain syndrome I of unspecified lower limb: Secondary | ICD-10-CM

## 2020-04-28 DIAGNOSIS — G8929 Other chronic pain: Secondary | ICD-10-CM

## 2020-04-28 MED ORDER — OXYCODONE-ACETAMINOPHEN 10-325 MG PO TABS: 1.0000 | ORAL_TABLET | Freq: Three times a day (TID) | ORAL | 0 refills | Status: DC | PRN

## 2020-04-28 MED ORDER — ALPRAZOLAM 2 MG PO TABS: 2.0000 mg | ORAL_TABLET | Freq: Every evening | ORAL | 1 refills | Status: DC | PRN

## 2020-04-28 NOTE — Telephone Encounter (Signed)
Meds sent

## 2020-04-29 MED FILL — OXYCODONE-APAP 10-325: 10-325 | 30 days supply | Qty: 90 | Fill #0

## 2020-04-30 ENCOUNTER — Other Ambulatory Visit: Payer: Self-pay

## 2020-04-30 ENCOUNTER — Ambulatory Visit
Admission: RE | Admit: 2020-04-30 | Discharge: 2020-04-30 | Disposition: A | Discharge status: Home / Self Care | Payer: No Typology Code available for payment source | Source: Ambulatory Visit | Attending: Family Medicine | Admitting: Family Medicine

## 2020-04-30 DIAGNOSIS — Z1231 Encounter for screening mammogram for malignant neoplasm of breast: Secondary | ICD-10-CM

## 2020-05-04 ENCOUNTER — Ambulatory Visit (INDEPENDENT_AMBULATORY_CARE_PROVIDER_SITE_OTHER): Payer: No Typology Code available for payment source | Admitting: Gastroenterology

## 2020-05-04 ENCOUNTER — Other Ambulatory Visit: Payer: Self-pay

## 2020-05-04 ENCOUNTER — Other Ambulatory Visit: Payer: Self-pay | Admitting: Family Medicine

## 2020-05-04 ENCOUNTER — Encounter: Payer: Self-pay | Admitting: Gastroenterology

## 2020-05-04 VITALS — BP 116/74 | HR 59 | Ht 65.5 in | Wt 141.5 lb

## 2020-05-04 DIAGNOSIS — K5900 Constipation, unspecified: Secondary | ICD-10-CM | POA: Diagnosis not present

## 2020-05-04 DIAGNOSIS — R1310 Dysphagia, unspecified: Secondary | ICD-10-CM | POA: Diagnosis not present

## 2020-05-04 DIAGNOSIS — Z86010 Personal history of colonic polyps: Secondary | ICD-10-CM | POA: Diagnosis not present

## 2020-05-04 DIAGNOSIS — R194 Change in bowel habit: Secondary | ICD-10-CM

## 2020-05-04 MED ORDER — VIIBRYD 20 MG PO TABS: 1.0000 | ORAL_TABLET | Freq: Every day | ORAL | 0 refills | Status: DC

## 2020-05-04 MED FILL — VIIBRYD 20 MG TABLET: 20 | 90 days supply | Qty: 90 | Fill #0
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
## 2020-05-04 NOTE — Patient Instructions (Addendum)
If you are age 51 or older, your body mass index should be between 23-30. Your Body mass index is 23.19 kg/m. If this is out of the aforementioned range listed, please consider follow up with your Primary Care Provider.  If you are age 25 or younger, your body mass index should be between 19-25. Your Body mass index is 23.19 kg/m. If this is out of the aformentioned range listed, please consider follow up with your Primary Care Provider.   You have been scheduled for an endoscopy. Please follow written instructions given to you at your visit today. If you use inhalers (even only as needed), please bring them with you on the day of your procedure.  You have been given a Sitzmarker - follow instructions. You will need to have an abdominal x-ray in 5 days.  Please go to [facility] for this x-ray.  We are requesting records from [place].  It was a pleasure to see you today!  Vito Cirigliano, D.O.

## 2020-05-08 ENCOUNTER — Encounter: Payer: Self-pay | Admitting: Gastroenterology

## 2020-05-11 ENCOUNTER — Ambulatory Visit (HOSPITAL_BASED_OUTPATIENT_CLINIC_OR_DEPARTMENT_OTHER)
Admission: RE | Admit: 2020-05-11 | Discharge: 2020-05-11 | Disposition: A | Discharge status: Home / Self Care | Payer: No Typology Code available for payment source | Source: Ambulatory Visit | Attending: Gastroenterology | Admitting: Gastroenterology

## 2020-05-11 ENCOUNTER — Other Ambulatory Visit: Payer: Self-pay

## 2020-05-11 DIAGNOSIS — R1310 Dysphagia, unspecified: Secondary | ICD-10-CM | POA: Insufficient documentation

## 2020-05-11 DIAGNOSIS — K5900 Constipation, unspecified: Secondary | ICD-10-CM | POA: Insufficient documentation

## 2020-05-12 MED FILL — ALPRAZolam 2 MG TABS: 2 | 30 days supply | Qty: 60 | Fill #0

## 2020-05-13 ENCOUNTER — Other Ambulatory Visit: Payer: Self-pay | Admitting: Gastroenterology

## 2020-05-13 DIAGNOSIS — R194 Change in bowel habit: Secondary | ICD-10-CM

## 2020-05-13 DIAGNOSIS — K5900 Constipation, unspecified: Secondary | ICD-10-CM

## 2020-05-20 ENCOUNTER — Other Ambulatory Visit: Payer: Self-pay

## 2020-05-20 ENCOUNTER — Ambulatory Visit (AMBULATORY_SURGERY_CENTER): Payer: No Typology Code available for payment source | Admitting: Gastroenterology

## 2020-05-20 ENCOUNTER — Encounter: Payer: Self-pay | Admitting: Gastroenterology

## 2020-05-20 VITALS — BP 142/91 | HR 65 | Temp 97.7°F | Resp 19 | Ht 65.0 in | Wt 141.0 lb

## 2020-05-20 DIAGNOSIS — K200 Eosinophilic esophagitis: Secondary | ICD-10-CM

## 2020-05-20 DIAGNOSIS — R1310 Dysphagia, unspecified: Secondary | ICD-10-CM | POA: Diagnosis present

## 2020-05-20 DIAGNOSIS — K449 Diaphragmatic hernia without obstruction or gangrene: Secondary | ICD-10-CM | POA: Diagnosis not present

## 2020-05-20 DIAGNOSIS — K2100 Gastro-esophageal reflux disease with esophagitis, without bleeding: Secondary | ICD-10-CM | POA: Diagnosis not present

## 2020-05-20 DIAGNOSIS — K2970 Gastritis, unspecified, without bleeding: Secondary | ICD-10-CM | POA: Diagnosis not present

## 2020-05-20 DIAGNOSIS — K2950 Unspecified chronic gastritis without bleeding: Secondary | ICD-10-CM | POA: Diagnosis not present

## 2020-05-20 MED ORDER — SODIUM CHLORIDE 0.9 % IV SOLN: 500.0000 mL | Freq: Once | INTRAVENOUS | Status: DC

## 2020-05-20 NOTE — Progress Notes (Signed)
Called to room to assist during endoscopic procedure.  Patient ID and intended procedure confirmed with present staff. Received instructions for my participation in the procedure from the performing physician.  

## 2020-05-20 NOTE — Progress Notes (Signed)
A/ox3, pleased with MAC, report to RN 

## 2020-05-20 NOTE — Progress Notes (Signed)
VS by JD  No changes to medical or social hx since previsit.

## 2020-05-20 NOTE — Patient Instructions (Signed)
YOU HAD AN ENDOSCOPIC PROCEDURE TODAY AT [facility]:   Refer to the procedure report that was given to you for any specific questions about what was found during the examination.  If the procedure report does not answer your questions, please call your gastroenterologist to clarify.  If you requested that your care partner not be given the details of your procedure findings, then the procedure report has been included in a sealed envelope for you to review at your convenience later.  YOU SHOULD EXPECT: Some feelings of bloating in the abdomen. Passage of more gas than usual.  Walking can help get rid of the air that was put into your GI tract during the procedure and reduce the bloating  Please Note:  You might notice some irritation and congestion in your nose or some drainage.  This is from the oxygen used during your procedure.  There is no need for concern and it should clear up in a day or so.  SYMPTOMS TO REPORT IMMEDIATELY:  Following upper endoscopy (EGD)  Vomiting of blood or coffee ground material  New chest pain or pain under the shoulder blades  Painful or persistently difficult swallowing  New shortness of breath  Fever of 100F or higher  Black, tarry-looking stools  For urgent or emergent issues, a gastroenterologist can be reached at any hour by calling [phone]. Do not use MyChart messaging for urgent concerns.    DIET:  FOLLOW DILATION DIET- SEE HANDOUT!!!  Drink plenty of fluids but you should avoid alcoholic beverages for 24 hours.  ACTIVITY:  You should plan to take it easy for the rest of today and you should NOT DRIVE or use heavy machinery until tomorrow (because of the sedation medicines used during the test).    FOLLOW UP: Our staff will call the number listed on your records 48-72 hours following your procedure to check on you and address any questions or concerns that you may have regarding the information given to you following your  procedure. If we do not reach you, we will leave a message.  We will attempt to reach you two times.  During this call, we will ask if you have developed any symptoms of COVID 19. If you develop any symptoms (ie: fever, flu-like symptoms, shortness of breath, cough etc.) before then, please call [phone].  If you test positive for Covid 19 in the 2 weeks post procedure, please call and report this information to [place].    If any biopsies were taken you will be contacted by phone or by letter within the next 1-3 weeks.  Please call us at [phone] if you have not heard about the biopsies in 3 weeks.    SIGNATURES/CONFIDENTIALITY: You and/or your care partner have signed paperwork which will be entered into your electronic medical record.  These signatures attest to the fact that that the information above on your After Visit Summary has been reviewed and is understood.  Full responsibility of the confidentiality of this discharge information lies with you and/or your care-partner.  Await pathology  Please read over handouts about gastritis, esophageal stricture  Follow dilation diet  Increase Prilosec 40 mg to twice weekly (30 minutes before breakfast and dinner) for 6 weeks, then reduce back to 40 mg daily (30 minutes before breakfast)  Dr. Vivia Ewing office will call you to set up an office visit for 3 months

## 2020-05-20 NOTE — Op Note (Signed)
[facility] Patient Name: [name] Procedure Date: 05/20/2020 1:34 PM MRN: [id number] Endoscopist: Gerrit Heck , MD Age: 49 Referring MD:  Date of Birth: 10-09-1970 Gender: Female Account #: [id number] Procedure:                Upper GI endoscopy Indications:              Dysphagia, Diarrhea/Loose stools, Nausea                           EGD in 2014 with empiric esophageal dilation for                            good clinical response. Medicines:                Monitored Anesthesia Care Procedure:                Pre-Anesthesia Assessment:                           - Prior to the procedure, a History and Physical                            was performed, and patient medications and                            allergies were reviewed. The patient's tolerance of                            previous anesthesia was also reviewed. The risks                            and benefits of the procedure and the sedation                            options and risks were discussed with the patient.                            All questions were answered, and informed consent                            was obtained. Prior Anticoagulants: The patient has                            taken no previous anticoagulant or antiplatelet                            agents. ASA Grade Assessment: II - A patient with                            mild systemic disease. After reviewing the risks                            and benefits, the patient was deemed in  satisfactory condition to undergo the procedure.                           After obtaining informed consent, the endoscope was                            passed under direct vision. Throughout the                            procedure, the patient's blood pressure, pulse, and                            oxygen saturations were monitored continuously. The                            Endoscope was introduced through the  mouth, and                            advanced to the second part of duodenum. The upper                            GI endoscopy was accomplished without difficulty.                            The patient tolerated the procedure well. Scope In: Scope Out: Findings:                 A 4 cm hiatal hernia was present. Hill grade 3                            valve noted on retroflexion.                           LA Grade A (one or more mucosal breaks less than 5                            mm, not extending between tops of 2 mucosal folds)                            esophagitis with no bleeding was found 34 cm from                            the incisors.                           The upper third of the esophagus and middle third                            of the esophagus were normal. The scope was                            withdrawn. Given symptoms of dysphagia and previous  good response to empiric dilation, the decision was                            made for empiric dilation today. Dilation was                            performed with a Maloney dilator with mild                            resistance at 67 Fr. The dilation site was examined                            following endoscope reinsertion and showed no                            bleeding, mucosal tear or perforation. Estimated                            blood loss: none.                           Localized mild inflammation characterized by                            erythema was found in the gastric antrum. Biopsies                            were taken with a cold forceps for Helicobacter                            pylori testing. Estimated blood loss was minimal.                           The gastric fundus, gastric body and incisura were                            normal. Additional biopsies were taken with a cold                            forceps for Helicobacter pylori testing. Estimated                             blood loss was minimal.                           The examined duodenum was normal. Biopsies for                            histology were taken with a cold forceps for                            evaluation of celiac disease. Estimated blood loss  was minimal. Complications:            No immediate complications. Estimated Blood Loss:     Estimated blood loss was minimal. Impression:               - 4 cm hiatal hernia.                           - LA Grade A reflux esophagitis with no bleeding.                           - Normal upper third of esophagus and middle third                            of esophagus. Dilated.                           - Gastritis. Biopsied.                           - Normal gastric fundus, gastric body and incisura.                            Biopsied.                           - Normal examined duodenum. Biopsied. Recommendation:           - Patient has a contact number available for                            emergencies. The signs and symptoms of potential                            delayed complications were discussed with the                            patient. Return to normal activities tomorrow.                            Written discharge instructions were provided to the                            patient.                           - Resume previous diet.                           - Continue present medications.                           - Await pathology results.                           - Increase Prilosec (omeprazole) to 40 mg PO BID  for 6 weeks, then reduce back to 40 mg daily.                           - Return to GI clinic in 3 months.                           - Based on clinical response to today's procedure,                            can plan for Esophageal Manometry if needed. Gerrit Heck, MD 05/20/2020 1:58:16 PM

## 2020-05-20 NOTE — Progress Notes (Signed)
Pt has frequent cough after procedure- Dr. Bryan Lemma is aware

## 2020-05-22 ENCOUNTER — Telehealth: Payer: Self-pay

## 2020-05-22 NOTE — Telephone Encounter (Signed)
  Follow up Call-  Call back number 05/20/2020  Post procedure Call Back phone  # [phone]  Permission to leave phone message Yes  Some recent data might be hidden     Patient questions:  Do you have a fever, pain , or abdominal swelling? No. Pain Score  0 *  Have you tolerated food without any problems? Yes.    Have you been able to return to your normal activities? Yes.    Do you have any questions about your discharge instructions: Diet   No. Medications  No. Follow up visit  No.  Do you have questions or concerns about your Care? No.  Actions: * If pain score is 4 or above: No action needed, pain <4. 1. Have you developed a fever since your procedure? no  2.   Have you had an respiratory symptoms (SOB or cough) since your procedure? no  3.   Have you tested positive for COVID 19 since your procedure no  4.   Have you had any family members/close contacts diagnosed with the COVID 19 since your procedure?  no   If yes to any of these questions please route to Joylene John, RN and Joella Prince, RN

## 2020-05-26 ENCOUNTER — Telehealth: Payer: Self-pay | Admitting: Gastroenterology

## 2020-05-26 NOTE — Telephone Encounter (Signed)
Spoke to patient who has decided to cancel her ARM scheduled for later this month. She states that she would like to follow up with Dr Bryan Lemma to discuss other options. ARM cancelled and a follow up appointment scheduled for office visit.

## 2020-05-28 ENCOUNTER — Encounter: Payer: Self-pay | Admitting: Gastroenterology

## 2020-06-04 ENCOUNTER — Encounter: Payer: Self-pay | Admitting: Family Medicine

## 2020-06-04 DIAGNOSIS — M999 Biomechanical lesion, unspecified: Secondary | ICD-10-CM

## 2020-06-04 DIAGNOSIS — G8929 Other chronic pain: Secondary | ICD-10-CM

## 2020-06-04 DIAGNOSIS — G90529 Complex regional pain syndrome I of unspecified lower limb: Secondary | ICD-10-CM

## 2020-06-05 ENCOUNTER — Other Ambulatory Visit: Payer: Self-pay | Admitting: Family Medicine

## 2020-06-05 MED ORDER — OXYCODONE-ACETAMINOPHEN 10-325 MG PO TABS: 1.0000 | ORAL_TABLET | Freq: Three times a day (TID) | ORAL | 0 refills | Status: DC | PRN

## 2020-06-05 MED FILL — OXYCODONE-APAP 10-325: 10-325 | 30 days supply | Qty: 90 | Fill #0

## 2020-06-09 MED FILL — ALPRAZOLAM 2 MG TABS: 2 | 30 days supply | Qty: 60 | Fill #1

## 2020-06-12 ENCOUNTER — Other Ambulatory Visit (HOSPITAL_COMMUNITY): Payer: No Typology Code available for payment source

## 2020-06-15 ENCOUNTER — Encounter (HOSPITAL_COMMUNITY): Payer: Self-pay

## 2020-06-15 ENCOUNTER — Ambulatory Visit (HOSPITAL_COMMUNITY): Admit: 2020-06-15 | Payer: No Typology Code available for payment source | Admitting: Gastroenterology

## 2020-06-15 SURGERY — MANOMETRY, ANORECTAL

## 2020-06-17 ENCOUNTER — Other Ambulatory Visit: Payer: Self-pay | Admitting: Family Medicine

## 2020-06-17 ENCOUNTER — Ambulatory Visit (INDEPENDENT_AMBULATORY_CARE_PROVIDER_SITE_OTHER): Payer: No Typology Code available for payment source | Admitting: Family Medicine

## 2020-06-17 ENCOUNTER — Encounter: Payer: Self-pay | Admitting: Family Medicine

## 2020-06-17 VITALS — BP 118/55 | HR 73 | Ht 65.0 in | Wt 143.0 lb

## 2020-06-17 DIAGNOSIS — F988 Other specified behavioral and emotional disorders with onset usually occurring in childhood and adolescence: Secondary | ICD-10-CM | POA: Diagnosis not present

## 2020-06-17 DIAGNOSIS — F5104 Psychophysiologic insomnia: Secondary | ICD-10-CM

## 2020-06-17 DIAGNOSIS — G90529 Complex regional pain syndrome I of unspecified lower limb: Secondary | ICD-10-CM

## 2020-06-17 DIAGNOSIS — M999 Biomechanical lesion, unspecified: Secondary | ICD-10-CM | POA: Diagnosis not present

## 2020-06-17 DIAGNOSIS — G8929 Other chronic pain: Secondary | ICD-10-CM | POA: Diagnosis not present

## 2020-06-17 DIAGNOSIS — F339 Major depressive disorder, recurrent, unspecified: Secondary | ICD-10-CM

## 2020-06-17 MED ORDER — OXYCODONE-ACETAMINOPHEN 10-325 MG PO TABS: 1.0000 | ORAL_TABLET | Freq: Three times a day (TID) | ORAL | 0 refills | Status: DC | PRN

## 2020-06-17 MED ORDER — MIRTAZAPINE 7.5 MG PO TABS: 7.5000 mg | ORAL_TABLET | Freq: Every day | ORAL | 1 refills | Status: DC

## 2020-06-17 MED FILL — MIRTAZAPINE 7.5 MG TABLET: 7.5 | 30 days supply | Qty: 30 | Fill #0

## 2020-06-17 NOTE — Assessment & Plan Note (Signed)
Doing well on current regimen okay for refills.  Blood pressure looks phenomenal.
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
## 2020-06-17 NOTE — Assessment & Plan Note (Signed)
Indication for chronic opioid: Complex regional pain syndrome Medication and dose: Percocet 10/325 mg # pills per month: 90 Last UDS date: 11/2019 Opioid Treatment Agreement signed (Y/N): Y Opioid Treatment Agreement last reviewed with patient:  11/2019 N[facility]SRS reviewed this encounter (include red flags):  Yes F/U : 3 mo

## 2020-06-17 NOTE — Assessment & Plan Note (Addendum)
Fortunately things have improved.  Her job situation has improved though she is not sure she will stay on it long-term.  Also the EAP has been helpful.  Hopefully she can continue this.  If she runs out of designated visits then we can always consider formal referral for therapy/counseling.  We did discuss actually trying to go down on the Viibryd to see if this helps with some of her symptoms.  I most feel like she is not wanting to engage in really enjoy things so she is not really experiencing there is more positive emotions in most possibly to flat.  So again a try that for about 3 or 4 weeks.  And go from there.

## 2020-06-17 NOTE — Patient Instructions (Signed)
Cut your Viibryd in half and take 10 mg daily. We will try this for 3 to 4 weeks. We will try Remeron about bedtime. You may need to play around the timing a little bit. You can try taking it maybe 30 minutes before bed to start. Can also start with a half a tab for 3 or 4 nights before going up to a whole tab to see how you feel. Start weaning your Xanax to 1-1/2 tabs nightly for 5 days, then 1 tab nightly for 5 days, then a half a tab nightly for 5 days, and then discontinue.

## 2020-06-17 NOTE — Assessment & Plan Note (Signed)
We discussed weaning off of the alprazolam.  I really feel like [name] is experiencing a rebound phenomenon and has been on it for quite a long time.  I discussed tapering with her down to 1-1/2 tabs for 5 days, 1 tab for 5 days and then a half a tab or 5 days and then stopping completely.  [name] is tried multiple sleep aids in the past and says they only tend to work for anywhere from a few days to maybe a week and then they quit working.  Again really need to get her off of alprazolam for sleep.  [name] is willing to try Remeron so we will give that a try and see how [name] does at a very low dose [name] says it works well for her sister who has sleep issues as well.  We will start with 7.5 mg and maybe even have her try taking a half.  Discussed that it will be a little difficult over the next couple weeks as we wean the Xanax for her sleep.  We may also need to really get her in with a sleep specialist to help [place] with medication management if we cannot improve this.

## 2020-06-24 ENCOUNTER — Other Ambulatory Visit: Payer: Self-pay | Admitting: Family Medicine

## 2020-06-24 ENCOUNTER — Telehealth: Payer: Self-pay | Admitting: *Deleted

## 2020-06-24 ENCOUNTER — Ambulatory Visit: Payer: No Typology Code available for payment source | Admitting: Gastroenterology

## 2020-06-24 MED ORDER — FETZIMA 20 MG PO CP24: ORAL_CAPSULE | ORAL | 0 refills | Status: DC

## 2020-06-24 NOTE — Telephone Encounter (Signed)
Spoke w/pt and she stated that she is doing the taper of the xanax. As per last OV note:  I discussed tapering with her down to 1-1/2 tabs for 5 days, 1 tab for 5 days and then a half a tab or 5 days and then stopping completely.    She is willing to try Remeron so we will give that a try and see how she does at a very low dose she says it works well for her sister who has sleep issues as well.  We will start with 7.5 mg and maybe even have her try taking a half.  Discussed that it will be a little difficult over the next couple weeks as we wean the Xanax for her sleep.  Currently taking xanax 1.5 tabs, and .5 tabs of Remeron 30 mins before bed.  She states she really would like to get back on the [facility] and would like to see if she can go ahead and get started on this because this really worked well for her in the past.

## 2020-06-24 NOTE — Telephone Encounter (Signed)
Sent new rx for [employer]

## 2020-06-24 NOTE — Telephone Encounter (Signed)
LVM advising pt that Rx has been sent and to continue current regimen for now.   Asked that she call or send a mychart if any questions.

## 2020-06-24 NOTE — Telephone Encounter (Signed)
Pt called and stated that since she has decreased the Vyybrid dosage 1 week ago she has started to have paranoid thoughts and doesn't think that this is going to work.   She also reports that she has some confusion as to how she is to be taking the xanax and remeron. She said that she is weaning off of the xanax and has been taking the remeron. She would like Dr. Madilyn Fireman to call her back to discuss how/what she should do.

## 2020-06-24 NOTE — Telephone Encounter (Signed)
Hold the remeron.  Stick with 1 pill of xanax at night ( not 2). Continue to taper the viibyrd

## 2020-06-25 ENCOUNTER — Encounter: Payer: Self-pay | Admitting: Family Medicine

## 2020-06-25 ENCOUNTER — Telehealth: Payer: Self-pay | Admitting: Neurology

## 2020-06-25 ENCOUNTER — Other Ambulatory Visit: Payer: Self-pay | Admitting: Family Medicine

## 2020-06-25 MED ORDER — AMPHETAMINE-DEXTROAMPHETAMINE 20 MG PO TABS
20.0000 mg | ORAL_TABLET | Freq: Two times a day (BID) | ORAL | 0 refills | Status: DC
Start: 2020-06-25 — End: 2020-07-20

## 2020-06-25 MED FILL — DEXTROAMP-AMPHETAMIN 20 MG: 20 | 90 days supply | Qty: 180 | Fill #0

## 2020-06-25 NOTE — Telephone Encounter (Signed)
Prior Authorization for [facility] submitted via covermymeds. Awaiting response.

## 2020-06-25 NOTE — Telephone Encounter (Signed)
sent 

## 2020-06-26 LAB — DRUG MONITORING, PANEL 6 WITH CONFIRMATION, URINE
6 Acetylmorphine: NEGATIVE ng/mL (ref <10)
Alcohol Metabolites: NEGATIVE ng/mL
Alphahydroxyalprazolam: 435 ng/mL — ABNORMAL HIGH (ref <25)
Alphahydroxymidazolam: NEGATIVE ng/mL (ref <50)
Alphahydroxytriazolam: NEGATIVE ng/mL (ref <50)
Aminoclonazepam: NEGATIVE ng/mL (ref <25)
Amphetamines: NEGATIVE ng/mL (ref <500)
Barbiturates: NEGATIVE ng/mL (ref <300)
Benzodiazepines: POSITIVE ng/mL — AB (ref <100)
Cocaine Metabolite: NEGATIVE ng/mL (ref <150)
Codeine: NEGATIVE ng/mL (ref <50)
Creatinine: 49.1 mg/dL
Hydrocodone: NEGATIVE ng/mL (ref <50)
Hydromorphone: NEGATIVE ng/mL (ref <50)
Hydroxyethylflurazepam: NEGATIVE ng/mL (ref <50)
Lorazepam: NEGATIVE ng/mL (ref <50)
Marijuana Metabolite: NEGATIVE ng/mL (ref <20)
Marijuana Metabolite: NEGATIVE ng/mL (ref <5)
Methadone Metabolite: NEGATIVE ng/mL (ref <100)
Morphine: NEGATIVE ng/mL (ref <50)
Nordiazepam: NEGATIVE ng/mL (ref <50)
Norhydrocodone: NEGATIVE ng/mL (ref <50)
Noroxycodone: 1917 ng/mL — ABNORMAL HIGH (ref <50)
Opiates: NEGATIVE ng/mL (ref <100)
Oxazepam: NEGATIVE ng/mL (ref <50)
Oxidant: NEGATIVE ug/mL
Oxycodone: 322 ng/mL — ABNORMAL HIGH (ref <50)
Oxycodone: POSITIVE ng/mL — AB (ref <100)
Oxymorphone: 240 ng/mL — ABNORMAL HIGH (ref <50)
Phencyclidine: NEGATIVE ng/mL (ref <25)
Temazepam: NEGATIVE ng/mL (ref <50)
pH: 5.4 (ref 4.5–9.0)

## 2020-06-26 LAB — DM TEMPLATE

## 2020-06-29 ENCOUNTER — Ambulatory Visit: Payer: No Typology Code available for payment source | Admitting: Gastroenterology

## 2020-06-29 ENCOUNTER — Telehealth: Payer: Self-pay | Admitting: Gastroenterology

## 2020-06-29 NOTE — Telephone Encounter (Signed)
Reviewed over 100 pages of medical records that were scanned from previous Gastroenterologist and notable for the following:  Has been following with [facility] since at least 2014 for abdominal pain and IBS-C.  Last seen 08/2016 for the same.  Was taking Trulance with improvement, but issues with insurance approval.  Was taking Compazine for IBS.  Was given Suprep cleanout with continued Trulance and consideration for MiraLAX and 3-64-monthfollow-up.  Previously failed Linzess and Amitiza.  2014-11 16 was seen at [facility] for reflux symptoms as well.  Previously treated with Dexilant, Carafate, then trialed omeprazole.  Unrevealing EGD in 2014.  Ongoing symptoms and repeat EGD with empiric dilation for reported dysphagia ingested 16 as below.  Labs: -09/2012: Normal ESR, CRP -09/2014: Normal CBC, CMP (K5.5), lipase -12/2015: Normal CBC, CMP, TSH, ESR -08/2016: Normal BMP, CBC, liver enzymes, TSH  Imaging: -09/2012: Abdominal ultrasound: Normal -12/2014: Abdominal ultrasound: Absent uterus and right ovary, otherwise normal -12/2015: Normal abdominal x-ray with moderate fecal retention  Endoscopic History: -EGD (10/26/2012): Normal-appearing.  Normal duodenal biopsies, very mild reactive non-H. pylori gastropathy -EGD (10/22/2014): Normal esophagus (biopsies normal), empiric dilation with [address], normal stomach (biopsies with focal chronic inactive gastritis negative for H. pylori, normal duodenum (biopsies normal) -Colonoscopy (02/17/2016): Internal hemorrhoids.  Repeat 5 years due to limited prep

## 2020-06-30 ENCOUNTER — Telehealth: Payer: Self-pay | Admitting: *Deleted

## 2020-06-30 NOTE — Telephone Encounter (Signed)
Fetzima ER 20mg  approved 06/29/20-09/26/20. Pharmacy notified.

## 2020-07-01 MED FILL — FETZIMA ER 20 MG CAPSULE: 20 | 30 days supply | Qty: 58 | Fill #0

## 2020-07-20 ENCOUNTER — Other Ambulatory Visit: Payer: Self-pay | Admitting: Family Medicine

## 2020-07-20 ENCOUNTER — Other Ambulatory Visit: Payer: Self-pay | Admitting: *Deleted

## 2020-07-20 DIAGNOSIS — G90529 Complex regional pain syndrome I of unspecified lower limb: Secondary | ICD-10-CM

## 2020-07-20 DIAGNOSIS — M999 Biomechanical lesion, unspecified: Secondary | ICD-10-CM

## 2020-07-20 DIAGNOSIS — G8929 Other chronic pain: Secondary | ICD-10-CM

## 2020-07-20 MED ORDER — PROCHLORPERAZINE MALEATE 5 MG PO TABS: 5.0000 mg | ORAL_TABLET | Freq: Three times a day (TID) | ORAL | 0 refills | Status: DC

## 2020-07-20 MED ORDER — OXYCODONE-ACETAMINOPHEN 10-325 MG PO TABS: 1.0000 | ORAL_TABLET | Freq: Three times a day (TID) | ORAL | 0 refills | Status: DC | PRN

## 2020-07-20 MED ORDER — OXYCODONE-ACETAMINOPHEN 10-325 MG PO TABS
1.0000 | ORAL_TABLET | Freq: Three times a day (TID) | ORAL | 0 refills | Status: DC | PRN
Start: 2020-07-20 — End: 2020-07-20

## 2020-07-20 MED FILL — PROCHLORPERAZINE 10 MG TAB: 10 | 90 days supply | Qty: 135 | Fill #0

## 2020-07-20 MED FILL — MIRTAZAPINE 7.5 MG TABLET: 7.5 | 30 days supply | Qty: 30 | Fill #1

## 2020-07-20 MED FILL — SUMAtriptan SUCCINATE 50 MG: 50 | 30 days supply | Qty: 18 | Fill #3

## 2020-07-20 MED FILL — OXYCODONE-APAP 10-325: 10-325 | 30 days supply | Qty: 90 | Fill #0

## 2020-07-22 ENCOUNTER — Ambulatory Visit (INDEPENDENT_AMBULATORY_CARE_PROVIDER_SITE_OTHER): Payer: No Typology Code available for payment source | Admitting: Family Medicine

## 2020-07-22 ENCOUNTER — Encounter: Payer: Self-pay | Admitting: Family Medicine

## 2020-07-22 ENCOUNTER — Other Ambulatory Visit: Payer: Self-pay

## 2020-07-22 ENCOUNTER — Ambulatory Visit (INDEPENDENT_AMBULATORY_CARE_PROVIDER_SITE_OTHER): Payer: No Typology Code available for payment source

## 2020-07-22 VITALS — BP 125/87 | HR 96 | Temp 98.5°F

## 2020-07-22 DIAGNOSIS — J40 Bronchitis, not specified as acute or chronic: Secondary | ICD-10-CM | POA: Diagnosis not present

## 2020-07-22 DIAGNOSIS — R0789 Other chest pain: Secondary | ICD-10-CM

## 2020-07-22 DIAGNOSIS — J329 Chronic sinusitis, unspecified: Secondary | ICD-10-CM

## 2020-07-22 DIAGNOSIS — R059 Cough, unspecified: Secondary | ICD-10-CM | POA: Diagnosis not present

## 2020-07-22 MED ORDER — AZITHROMYCIN 250 MG PO TABS: ORAL_TABLET | ORAL | 0 refills | Status: AC

## 2020-07-22 MED FILL — AZITHROMYCIN 250 MG TABS: 250 | 5 days supply | Qty: 6 | Fill #0
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
## 2020-07-29 ENCOUNTER — Telehealth (INDEPENDENT_AMBULATORY_CARE_PROVIDER_SITE_OTHER): Payer: 59 | Admitting: Family Medicine

## 2020-07-29 ENCOUNTER — Encounter: Payer: Self-pay | Admitting: Family Medicine

## 2020-07-29 ENCOUNTER — Other Ambulatory Visit: Payer: Self-pay | Admitting: Family Medicine

## 2020-07-29 VITALS — BP 156/96 | HR 108 | Temp 99.2°F | Wt 147.0 lb

## 2020-07-29 DIAGNOSIS — F5104 Psychophysiologic insomnia: Secondary | ICD-10-CM | POA: Diagnosis not present

## 2020-07-29 DIAGNOSIS — J40 Bronchitis, not specified as acute or chronic: Secondary | ICD-10-CM

## 2020-07-29 DIAGNOSIS — J329 Chronic sinusitis, unspecified: Secondary | ICD-10-CM | POA: Diagnosis not present

## 2020-07-29 DIAGNOSIS — G90529 Complex regional pain syndrome I of unspecified lower limb: Secondary | ICD-10-CM

## 2020-07-29 DIAGNOSIS — F339 Major depressive disorder, recurrent, unspecified: Secondary | ICD-10-CM | POA: Diagnosis not present

## 2020-07-29 MED ORDER — FETZIMA 40 MG PO CP24: 1.0000 | ORAL_CAPSULE | Freq: Every day | ORAL | 1 refills | Status: DC

## 2020-07-29 MED ORDER — DOXYCYCLINE HYCLATE 100 MG PO TABS: 100.0000 mg | ORAL_TABLET | Freq: Two times a day (BID) | ORAL | 0 refills | Status: DC

## 2020-07-29 MED ORDER — OMEPRAZOLE 40 MG PO CPDR: 40.0000 mg | DELAYED_RELEASE_CAPSULE | Freq: Every day | ORAL | 1 refills | Status: DC

## 2020-07-29 MED ORDER — PRIMIDONE 50 MG PO TABS
100.0000 mg | ORAL_TABLET | Freq: Every day | ORAL | 1 refills | Status: DC
Start: 2020-07-29 — End: 2020-07-29

## 2020-07-29 MED ORDER — MIRTAZAPINE 30 MG PO TABS: 30.0000 mg | ORAL_TABLET | Freq: Every day | ORAL | 1 refills | Status: DC

## 2020-07-29 MED FILL — MIRTAZAPINE 30 MG TABLET: 30 | 30 days supply | Qty: 30 | Fill #0

## 2020-07-29 MED FILL — OMEPRAZOLE 40 MG CPDR: 40 | 90 days supply | Qty: 90 | Fill #0

## 2020-07-29 MED FILL — FETZIMA ER 40 MG CAPSULE: 40 | 90 days supply | Qty: 90 | Fill #0

## 2020-07-29 MED FILL — PRIMIDONE 50 MG TABS: 50 | 90 days supply | Qty: 180 | Fill #0

## 2020-07-29 MED FILL — DOXYCYCLINE HYCLATE 100 MG: 100 | 10 days supply | Qty: 20 | Fill #0

## 2020-07-29 NOTE — Assessment & Plan Note (Signed)
Just encouraged her to let us know if she needs an official referral she still should be an active patient there and should not need one but we are happy to provide 1 if they require it.
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
## 2020-07-29 NOTE — Assessment & Plan Note (Signed)
Doing much better back on the [facility] which she has done well with in the past.  We will go ahead and switch up to the 40 mg capsule.  Plan to follow-up in 2 to 3 months.  We will also adjust her Remeron for her sleep.

## 2020-07-29 NOTE — Assessment & Plan Note (Signed)
Discussed options.  Will try going up to 30 mg of the mirtazapine.  Okay to split tab if needed but did encourage her to check with the pharmacist on that.  I am glad she is off the Xanax I really feel like she was having rebound insomnia where it would work well for a few hours and then wear off and she would be wide-awake.  Plan to follow-up in 2 to 3 months.

## 2020-08-21 ENCOUNTER — Other Ambulatory Visit (HOSPITAL_COMMUNITY): Payer: Self-pay | Admitting: Physician Assistant

## 2020-08-21 ENCOUNTER — Telehealth: Payer: Self-pay | Admitting: Gastroenterology

## 2020-08-21 DIAGNOSIS — M21612 Bunion of left foot: Secondary | ICD-10-CM | POA: Diagnosis not present

## 2020-08-21 DIAGNOSIS — M792 Neuralgia and neuritis, unspecified: Secondary | ICD-10-CM | POA: Diagnosis not present

## 2020-08-21 DIAGNOSIS — G90522 Complex regional pain syndrome I of left lower limb: Secondary | ICD-10-CM | POA: Diagnosis not present

## 2020-08-21 DIAGNOSIS — Z79899 Other long term (current) drug therapy: Secondary | ICD-10-CM | POA: Diagnosis not present

## 2020-08-21 DIAGNOSIS — G894 Chronic pain syndrome: Secondary | ICD-10-CM | POA: Diagnosis not present

## 2020-08-21 DIAGNOSIS — Z5181 Encounter for therapeutic drug level monitoring: Secondary | ICD-10-CM | POA: Diagnosis not present

## 2020-08-21 MED FILL — OXYCODONE-APAP 10-325: 10-325 | 30 days supply | Qty: 120 | Fill #0

## 2020-08-21 NOTE — Telephone Encounter (Signed)
Inbound call from patient requesting a call back from the nurse.  States has not had a bowel movement for the past 4 weeks.  Please advise.

## 2020-08-21 NOTE — Telephone Encounter (Signed)
Spoke to patient who reports not having a bowel movement in 4 weeks,most likely OIC. She has gained almost 15 pounds over the past few weeks,and passes very little gas.No abdominal pain. Patient is unable to do ARM at this time as recommended during last visit,possibly at a later time. She has tried and failed Linzess and Amitiza. They work temporally then stop. Patient has an appointment scheduled 08/31/20 to discuss ARM and medication options. She was advised to try a colon cleanse with split dose MiraLAX as used for a colon prep,also to keep an enema on hand for extra help in relieving her constipation. Dr Bryan Lemma please advise with any further recommendations.

## 2020-08-21 NOTE — Telephone Encounter (Signed)
Agree with your advice to the patient. Can also add Senakot to facilitate some colonic motility. Will plan to discuss alternative treatment options following the clear-out, to include Movantik or methylnaltrexone for possible OIC.

## 2020-08-21 NOTE — Telephone Encounter (Signed)
Patient will add Senakot to facilitate some colonic motility

## 2020-08-25 MED FILL — SUMAtriptan SUCCINATE 50 MG: 50 | 30 days supply | Qty: 18 | Fill #4

## 2020-08-31 ENCOUNTER — Encounter: Payer: Self-pay | Admitting: Gastroenterology

## 2020-08-31 ENCOUNTER — Ambulatory Visit: Payer: No Typology Code available for payment source | Admitting: Gastroenterology

## 2020-08-31 ENCOUNTER — Other Ambulatory Visit: Payer: Self-pay | Admitting: Gastroenterology

## 2020-08-31 VITALS — BP 122/64 | HR 88 | Ht 65.5 in | Wt 155.0 lb

## 2020-08-31 DIAGNOSIS — Z86010 Personal history of colonic polyps: Secondary | ICD-10-CM

## 2020-08-31 DIAGNOSIS — K5900 Constipation, unspecified: Secondary | ICD-10-CM

## 2020-08-31 DIAGNOSIS — R1310 Dysphagia, unspecified: Secondary | ICD-10-CM

## 2020-08-31 DIAGNOSIS — K2100 Gastro-esophageal reflux disease with esophagitis, without bleeding: Secondary | ICD-10-CM

## 2020-08-31 MED ORDER — NALOXEGOL OXALATE 12.5 MG PO TABS: 12.5000 mg | ORAL_TABLET | Freq: Every day | ORAL | 3 refills | Status: DC

## 2020-08-31 MED FILL — MOVANTIK 12.5 MG TABS: 12.5 | 90 days supply | Qty: 90 | Fill #0

## 2020-08-31 MED FILL — MIRTAZAPINE 30 MG TABLET: 30 | 30 days supply | Qty: 30 | Fill #1
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
## 2020-08-31 NOTE — Patient Instructions (Signed)
If you are age 52 or older, your body mass index should be between 23-30. Your Body mass index is 25.4 kg/m. If this is out of the aforementioned range listed, please consider follow up with your Primary Care Provider.  If you are age 86 or younger, your body mass index should be between 19-25. Your Body mass index is 25.4 kg/m. If this is out of the aformentioned range listed, please consider follow up with your Primary Care Provider.   You have been referred for PT for pelvic floor retraining, you will be contacted with this appointment.   We have sent the following medications to your pharmacy for you to pick up at your convenience: Movantik   Follow up in 3-6 months.   It was a pleasure to see you today!  Vito Cirigliano, D.O.

## 2020-09-08 ENCOUNTER — Telehealth: Payer: Self-pay | Admitting: General Surgery

## 2020-09-08 NOTE — Telephone Encounter (Signed)
Placed a staff message to contact patient to schedule colonoscopy after PT completed.

## 2020-09-08 NOTE — Telephone Encounter (Signed)
-----   Message from [place], DO sent at 08/31/2020  8:26 PM EST ----- Just saw this patient in clinic earlier today.  Looked through her chart, and she is due for colonoscopy this year.  Can you please reach out to schedule for colonoscopy for ongoing screening/polyp surveillance.  This can wait until after her upcoming pelvic floor PT evaluation if she prefers.  Thank you.

## 2020-09-09 ENCOUNTER — Encounter: Payer: Self-pay | Admitting: Family Medicine

## 2020-09-11 ENCOUNTER — Other Ambulatory Visit: Payer: Self-pay | Admitting: Family Medicine

## 2020-09-11 MED ORDER — QUETIAPINE FUMARATE 25 MG PO TABS: 25.0000 mg | ORAL_TABLET | Freq: Every day | ORAL | 1 refills | Status: DC

## 2020-09-11 MED FILL — QUETIAPINE FUMARATE 25 MG T: 25 | 30 days supply | Qty: 60 | Fill #0

## 2020-09-21 ENCOUNTER — Ambulatory Visit (INDEPENDENT_AMBULATORY_CARE_PROVIDER_SITE_OTHER): Payer: 59

## 2020-09-21 ENCOUNTER — Ambulatory Visit (INDEPENDENT_AMBULATORY_CARE_PROVIDER_SITE_OTHER): Payer: 59 | Admitting: Family Medicine

## 2020-09-21 ENCOUNTER — Other Ambulatory Visit: Payer: Self-pay

## 2020-09-21 ENCOUNTER — Encounter: Payer: Self-pay | Admitting: Family Medicine

## 2020-09-21 VITALS — BP 137/89 | HR 81 | Temp 98.3°F | Wt 155.0 lb

## 2020-09-21 DIAGNOSIS — R002 Palpitations: Secondary | ICD-10-CM
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
## 2020-09-21 NOTE — Patient Instructions (Signed)
Labs today Heart Monitor Echo Try to cut back on caffeine and stimulant  Palpitations Palpitations are feelings that your heartbeat is not normal. Your heartbeat may feel like it is:  Uneven.  Faster than normal.  Fluttering.  Skipping a beat. This is usually not a serious problem. In some cases, you may need tests to rule out any serious problems. Follow these instructions at home: Pay attention to any changes in your condition. Take these actions to help manage your symptoms: Eating and drinking  Avoid: ? Coffee, tea, soft drinks, and energy drinks. ? Chocolate. ? Alcohol. ? Diet pills. Lifestyle  Try to lower your stress. These things can help you relax: ? Yoga. ? Deep breathing and meditation. ? Exercise. ? Using words and images to create positive thoughts (guided imagery). ? Using your mind to control things in your body (biofeedback).  Do not use drugs.  Get plenty of rest and sleep. Keep a regular bed time.   General instructions  Take over-the-counter and prescription medicines only as told by your doctor.  Do not use any products that contain nicotine or tobacco, such as cigarettes and e-cigarettes. If you need help quitting, ask your doctor.  Keep all follow-up visits as told by your doctor. This is important. You may need more tests if palpitations do not go away or get worse.   Contact a doctor if:  Your symptoms last more than 24 hours.  Your symptoms occur more often. Get help right away if you:  Have chest pain.  Feel short of breath.  Have a very bad headache.  Feel dizzy.  Pass out (faint). Summary  Palpitations are feelings that your heartbeat is uneven or faster than normal. It may feel like your heart is fluttering or skipping a beat.  Avoid food and drinks that may cause palpitations. These include caffeine, chocolate, and alcohol.  Try to lower your stress. Do not smoke or use drugs.  Get help right away if you faint or have  chest pain, shortness of breath, a severe headache, or dizziness. This information is not intended to replace advice given to you by your health care provider. Make sure you discuss any questions you have with your health care provider. Document Revised: 08/23/2017 Document Reviewed: 08/23/2017 Elsevier Patient Education  2021 [employer].

## 2020-09-23 ENCOUNTER — Other Ambulatory Visit: Payer: Self-pay

## 2020-09-23 ENCOUNTER — Ambulatory Visit: Payer: 59

## 2020-09-23 DIAGNOSIS — R002 Palpitations: Secondary | ICD-10-CM

## 2020-09-23 NOTE — Progress Notes (Signed)
Ordered Monitor.

## 2020-09-23 NOTE — Addendum Note (Signed)
Addended by: Narda Rutherford on: 09/23/2020 03:47 PM   Modules accepted: Orders

## 2020-09-29 ENCOUNTER — Other Ambulatory Visit (HOSPITAL_COMMUNITY): Payer: Self-pay | Admitting: Nurse Practitioner

## 2020-09-29 DIAGNOSIS — G894 Chronic pain syndrome: Secondary | ICD-10-CM | POA: Diagnosis not present

## 2020-09-29 DIAGNOSIS — R002 Palpitations: Secondary | ICD-10-CM | POA: Diagnosis not present

## 2020-09-29 DIAGNOSIS — M792 Neuralgia and neuritis, unspecified: Secondary | ICD-10-CM | POA: Diagnosis not present

## 2020-09-29 DIAGNOSIS — G90522 Complex regional pain syndrome I of left lower limb: Secondary | ICD-10-CM | POA: Diagnosis not present

## 2020-09-29 DIAGNOSIS — M21612 Bunion of left foot: Secondary | ICD-10-CM | POA: Diagnosis not present

## 2020-09-30 MED FILL — METHADONE HCL 5 MG TABLET: 5 | 30 days supply | Qty: 90 | Fill #0

## 2020-09-30 MED FILL — SUMAtriptan SUCCINATE 50 MG: 50 | 30 days supply | Qty: 18 | Fill #5

## 2020-10-13 DIAGNOSIS — R002 Palpitations: Secondary | ICD-10-CM | POA: Diagnosis not present

## 2020-10-14 ENCOUNTER — Other Ambulatory Visit (HOSPITAL_COMMUNITY): Payer: Self-pay | Admitting: Nurse Practitioner

## 2020-10-14 DIAGNOSIS — G90522 Complex regional pain syndrome I of left lower limb: Secondary | ICD-10-CM | POA: Diagnosis not present

## 2020-10-14 DIAGNOSIS — M21612 Bunion of left foot: Secondary | ICD-10-CM | POA: Diagnosis not present

## 2020-10-14 DIAGNOSIS — M792 Neuralgia and neuritis, unspecified: Secondary | ICD-10-CM | POA: Diagnosis not present

## 2020-10-14 DIAGNOSIS — G894 Chronic pain syndrome: Secondary | ICD-10-CM | POA: Diagnosis not present

## 2020-10-15 ENCOUNTER — Other Ambulatory Visit (HOSPITAL_COMMUNITY): Payer: 59

## 2020-10-15 ENCOUNTER — Telehealth (HOSPITAL_COMMUNITY): Payer: Self-pay | Admitting: Family Medicine

## 2020-10-15 NOTE — Telephone Encounter (Signed)
Patient called this morning to cancel echocardiogram scheduled for this afternoon due to she is sick with a virus. She will call back at a later date to reschedule. Order will be removed from the Echo WQ and when patient calls back we will reinstate the order. Thank you.

## 2020-10-20 NOTE — Progress Notes (Signed)
MyChart message sent: Your long term cardiac monitor results showed 1 run of SVT. We are still waiting on the echo. Also, don't forget to get your labs drawn. Once all of this is done, please schedule a follow-up with Dr. Madilyn Fireman to discuss results and how you are feeling so we can determine next steps. Hope you are feeling better!

## 2020-11-02 ENCOUNTER — Other Ambulatory Visit (HOSPITAL_COMMUNITY): Payer: Self-pay

## 2020-11-02 MED FILL — Levomilnacipran HCl Cap ER 24HR 40 MG (Base Equivalent): ORAL | 90 days supply | Qty: 90 | Fill #0 | Status: AC

## 2020-11-02 MED FILL — Omeprazole Cap Delayed Release 40 MG: ORAL | 90 days supply | Qty: 90 | Fill #0 | Status: AC

## 2020-11-02 MED FILL — Sumatriptan Succinate Tab 50 MG: ORAL | 21 days supply | Qty: 12 | Fill #0 | Status: AC

## 2020-11-02 MED FILL — Methadone HCl Tab 5 MG: ORAL | 30 days supply | Qty: 90 | Fill #0 | Status: AC

## 2020-11-13 ENCOUNTER — Telehealth: Payer: Self-pay | Admitting: *Deleted

## 2020-11-13 ENCOUNTER — Other Ambulatory Visit (HOSPITAL_COMMUNITY): Payer: Self-pay

## 2020-11-13 DIAGNOSIS — F5104 Psychophysiologic insomnia: Secondary | ICD-10-CM

## 2020-11-13 MED ORDER — ZOLPIDEM TARTRATE 10 MG PO TABS
5.0000 mg | ORAL_TABLET | Freq: Every evening | ORAL | 1 refills | Status: DC | PRN
  Filled 2020-11-13: qty 30, 30d supply, fill #0

## 2020-11-13 MED FILL — Quetiapine Fumarate Tab 25 MG: ORAL | 30 days supply | Qty: 60 | Fill #0 | Status: AC

## 2020-11-13 NOTE — Telephone Encounter (Signed)
Called pt. She will stop the Seroquel bc of interaction with methadone,  even though has really been helping her sleep. Will change to Amibien. Monitor for Sedation.

## 2020-11-13 NOTE — Telephone Encounter (Signed)
Spoke w/pt and she wanted to make Dr. Madilyn Fireman aware of what has been going on since her last OV. She said that the Seroquel 25 mg has helped her tremendously with sleep. She only takes 1 tablet 1 hour prior to bedtime.   Her Pain management doctor started her on  Methadone 5 mg TID. (she is only taking this BID)   She reports that she continues to have the  Hard palpitations and that this has not gotten better. She will have an ECHO done on Friday 11/20/2020. She reports that she feels nauseated,lightheaded,gets SOB and sometimes she vomits.   She wanted to know Dr. Gardiner Ramus thoughts regarding the Seroquel and Methadone interaction. And if the interaction is serious enough that she should stop the Seroquel. She was told by the pain doctor that the only interaction would be with certain ABX. The pain doctor did an EKG prior to her starting the Methadone which was fine.   She was informed by the pharmacist that there is an interaction with these two medications.   She has not been taking the Adderall because of the way her heart has been feeling.  Pt was started on the Seroquel 25 mg on 09/11/2020 and was started on the Methadone on 10/14/2020. I asked her if the pain doctor had a complete list of her current medications prior to starting her on the Methadone she said that they did.   Pt is ok with Dr. Madilyn Fireman sending her a MyChart message about this or ok to call her with recommendations.

## 2020-11-16 ENCOUNTER — Other Ambulatory Visit (HOSPITAL_COMMUNITY): Payer: Self-pay

## 2020-11-20 ENCOUNTER — Ambulatory Visit (HOSPITAL_COMMUNITY): Payer: 59 | Attending: Cardiovascular Disease

## 2020-11-20 ENCOUNTER — Other Ambulatory Visit: Payer: Self-pay

## 2020-11-20 DIAGNOSIS — R002 Palpitations: Secondary | ICD-10-CM | POA: Insufficient documentation

## 2020-11-20 LAB — ECHOCARDIOGRAM COMPLETE
Area-P 1/2: 3.72 cm2
S' Lateral: 3.2 cm

## 2020-12-08 ENCOUNTER — Other Ambulatory Visit: Payer: Self-pay | Admitting: Family Medicine

## 2020-12-08 ENCOUNTER — Other Ambulatory Visit (HOSPITAL_COMMUNITY): Payer: Self-pay

## 2020-12-08 MED ORDER — PROCHLORPERAZINE MALEATE 5 MG PO TABS
5.0000 mg | ORAL_TABLET | Freq: Three times a day (TID) | ORAL | 0 refills | Status: DC
  Filled 2020-12-08: qty 270, 90d supply, fill #0

## 2020-12-08 MED ORDER — SUMATRIPTAN SUCCINATE 50 MG PO TABS
50.0000 mg | ORAL_TABLET | ORAL | 3 refills | Status: DC
  Filled 2020-12-08: qty 18, 30d supply, fill #0
  Filled 2021-01-28: qty 18, 30d supply, fill #1
  Filled 2021-03-08: qty 18, 30d supply, fill #2
  Filled 2021-04-13: qty 18, 30d supply, fill #3
  Filled 2021-05-20: qty 18, 30d supply, fill #4

## 2020-12-09 ENCOUNTER — Other Ambulatory Visit (HOSPITAL_COMMUNITY): Payer: Self-pay

## 2020-12-09 DIAGNOSIS — G90522 Complex regional pain syndrome I of left lower limb: Secondary | ICD-10-CM | POA: Diagnosis not present

## 2020-12-09 MED ORDER — MORPHINE SULFATE ER 15 MG PO TBCR
15.0000 mg | EXTENDED_RELEASE_TABLET | Freq: Two times a day (BID) | ORAL | 0 refills | Status: DC
  Filled 2020-12-09: qty 60, 30d supply, fill #0

## 2020-12-10 ENCOUNTER — Other Ambulatory Visit (HOSPITAL_COMMUNITY): Payer: Self-pay

## 2020-12-11 ENCOUNTER — Other Ambulatory Visit (HOSPITAL_COMMUNITY): Payer: Self-pay

## 2020-12-15 ENCOUNTER — Ambulatory Visit (INDEPENDENT_AMBULATORY_CARE_PROVIDER_SITE_OTHER): Payer: 59 | Admitting: Family Medicine

## 2020-12-15 ENCOUNTER — Other Ambulatory Visit: Payer: Self-pay

## 2020-12-15 ENCOUNTER — Other Ambulatory Visit (HOSPITAL_COMMUNITY): Payer: Self-pay

## 2020-12-15 ENCOUNTER — Encounter: Payer: Self-pay | Admitting: Family Medicine

## 2020-12-15 VITALS — BP 138/77 | HR 84 | Ht 66.0 in | Wt 151.0 lb

## 2020-12-15 DIAGNOSIS — Z23 Encounter for immunization: Secondary | ICD-10-CM

## 2020-12-15 DIAGNOSIS — H9193 Unspecified hearing loss, bilateral: Secondary | ICD-10-CM | POA: Diagnosis not present

## 2020-12-15 DIAGNOSIS — Z0000 Encounter for general adult medical examination without abnormal findings: Secondary | ICD-10-CM | POA: Diagnosis not present

## 2020-12-15 DIAGNOSIS — T50905A Adverse effect of unspecified drugs, medicaments and biological substances, initial encounter: Secondary | ICD-10-CM | POA: Diagnosis not present

## 2020-12-15 DIAGNOSIS — F339 Major depressive disorder, recurrent, unspecified: Secondary | ICD-10-CM | POA: Diagnosis not present

## 2020-12-15 MED ORDER — QUETIAPINE FUMARATE 25 MG PO TABS
25.0000 mg | ORAL_TABLET | Freq: Every day | ORAL | 1 refills | Status: DC
  Filled 2020-12-15: qty 180, 90d supply, fill #0
  Filled 2021-03-08: qty 180, 90d supply, fill #1

## 2020-12-15 MED ORDER — ACYCLOVIR 400 MG PO TABS
ORAL_TABLET | ORAL | 2 refills | Status: DC
  Filled 2020-12-15: qty 90, 30d supply, fill #0

## 2020-12-15 NOTE — Progress Notes (Addendum)
Subjective:     [name] is a 50 y.o. female and is here for a comprehensive physical exam. The patient reports no problems.  She is doing well overall.  Work has been going well.  She recently stopped the methadone on her own and her tachycardia actually went away.  She discussed with her pain management provider and they decided to discontinue it and put her on MS Contin instead.  She has been doing much better since then.  The methadone was causing SVT as well as PVCs.  She is doing well on the MS Contin now she says it does not make her sleepy and it does seem to help really control her pain.  They were actually able to get her nerve stimulator working again and so that has been really helpful.  She has restarted her Seroquel and does need a refill.  We had initially stopped it because we thought it was causing some interaction with the methadone.  Reports decreased hearing in both ears though worse on the right compared to the left.  She is noticing that she has had to significantly increase the volume on her TV and she is having more difficulty with word discrepancy  Social History   Socioeconomic History  . Marital status: Divorced    Spouse name: Florentine Diekman  . Number of children: 2  . Years of education: Not on file  . Highest education level: Some college, no degree  Occupational History  . 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 Smoking status: Current Every Day Smoker    Packs/day: 1.00    Types: Cigarettes  . Smokeless tobacco: Never Used  . Tobacco comment: vaporizer  Vaping Use  . Vaping Use: Never used  Substance and Sexual Activity  . Alcohol use: No    Alcohol/week: 0.0 standard drinks  . Drug use: No  . Sexual activity: Not on file  Other Topics Concern  . Not on file  Social History Narrative   Daughter is [name] and son is [name] Companion.    Right handed   Caffeine 5 cups daily    Lives at home  with husband    Social Determinants of Health   Financial Resource Strain: Not on file  Food Insecurity: Not on file  Transportation Needs: Not on file  Physical Activity: Not on file  Stress: Not on file  Social Connections: Not on file  Intimate Partner Violence: Not on file   Health Maintenance  Topic Date Due  . COLONOSCOPY (Pts 45-73yrs Insurance coverage will need to be confirmed)  Never done  . Hepatitis C Screening  Never done  . COVID-19 Vaccine (3 - Booster for Pfizer series) 07/16/2020  . INFLUENZA VACCINE  02/22/2021  . MAMMOGRAM  04/30/2022  . TETANUS/TDAP  08/13/2025  . HIV Screening  Completed  . HPV VACCINES  Aged Out    The following portions of the patient's history were reviewed and updated as appropriate: allergies, current medications, past family history, past medical history, past social history, past surgical history and problem list.  Review of Systems A comprehensive review of systems was negative.   Objective:    BP 138/77   Pulse 84   Ht 5\' 6"  (1.676 m)   Wt 151 lb (68.5 kg)   SpO2 100%   BMI 24.37 kg/m  General appearance: alert, cooperative and appears stated age Head: Normocephalic, without obvious abnormality,  atraumatic Eyes: conj clear, EOMI, PEERLA Ears: normal TM's and external ear canals both ears Nose: Nares normal. Septum midline. Mucosa normal. No drainage or sinus tenderness. Throat: lips, mucosa, and tongue normal; teeth and gums normal Neck: no adenopathy, no carotid bruit, no JVD, supple, symmetrical, trachea midline and thyroid not enlarged, symmetric, no tenderness/mass/nodules Back: symmetric, no curvature. ROM normal. No CVA tenderness. Lungs: clear to auscultation bilaterally Heart: regular rate and rhythm, S1, S2 normal, no murmur, click, rub or gallop Abdomen: soft, non-tender; bowel sounds normal; no masses,  no organomegaly Extremities: extremities normal, atraumatic, no cyanosis or edema Pulses: 2+ and  symmetric Skin: Skin color, texture, turgor normal. No rashes or lesions Lymph nodes: Cervical adenopathy: nl and Supraclavicular adenopathy: nl Neurologic: Alert and oriented X 3, normal strength and tone. Normal symmetric reflexes. Normal coordination and gait    Assessment:    Healthy female exam.      Plan:     See After Visit Summary for Counseling Recommendations   Keep up a regular exercise program and make sure you are eating a healthy  diet Try to eat 4 servings of dairy a day, or if you are lactose intolerant take a calcium with vitamin D daily.  Your vaccines are up to date.  For Shingrix given today.  Prevnar 20 given today-smoker. Updated labs including lipids ordered today.  Hearing loss-discussed possible referral to ENT for further evaluation that was any fluid on exam or anything today.  She says she will keep an eye on it and let me know if she decides she wants further assessment.

## 2020-12-15 NOTE — Assessment & Plan Note (Signed)
Stable

## 2020-12-16 ENCOUNTER — Ambulatory Visit: Payer: 59 | Admitting: Physical Therapy

## 2020-12-17 ENCOUNTER — Ambulatory Visit (INDEPENDENT_AMBULATORY_CARE_PROVIDER_SITE_OTHER): Payer: 59 | Admitting: Family Medicine

## 2020-12-17 DIAGNOSIS — R059 Cough, unspecified: Secondary | ICD-10-CM

## 2020-12-17 DIAGNOSIS — R509 Fever, unspecified: Secondary | ICD-10-CM

## 2020-12-17 DIAGNOSIS — Z20822 Contact with and (suspected) exposure to covid-19: Secondary | ICD-10-CM | POA: Diagnosis not present

## 2020-12-17 LAB — POCT INFLUENZA A/B
Influenza A, POC: NEGATIVE
Influenza B, POC: NEGATIVE

## 2020-12-17 NOTE — Progress Notes (Signed)
Pt c/o of fever and cough that started yesterday.    Will call with results.
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
## 2020-12-18 ENCOUNTER — Telehealth: Payer: Self-pay | Admitting: *Deleted

## 2020-12-18 ENCOUNTER — Other Ambulatory Visit (HOSPITAL_COMMUNITY): Payer: Self-pay

## 2020-12-18 NOTE — Telephone Encounter (Signed)
102 temp,body aches, rash, from shingrix vaccination that was given on Wednesday.   This was added to her allergy list.

## 2020-12-19 LAB — SARS-COV-2, NAA 2 DAY TAT

## 2020-12-19 LAB — NOVEL CORONAVIRUS, NAA: SARS-CoV-2, NAA: NOT DETECTED

## 2021-01-04 ENCOUNTER — Other Ambulatory Visit (HOSPITAL_COMMUNITY): Payer: Self-pay

## 2021-01-04 DIAGNOSIS — G894 Chronic pain syndrome: Secondary | ICD-10-CM | POA: Diagnosis not present

## 2021-01-04 DIAGNOSIS — M792 Neuralgia and neuritis, unspecified: Secondary | ICD-10-CM | POA: Diagnosis not present

## 2021-01-04 DIAGNOSIS — M21612 Bunion of left foot: Secondary | ICD-10-CM | POA: Diagnosis not present

## 2021-01-04 DIAGNOSIS — G90522 Complex regional pain syndrome I of left lower limb: Secondary | ICD-10-CM | POA: Diagnosis not present

## 2021-01-04 MED ORDER — MORPHINE SULFATE ER 15 MG PO TBCR
15.0000 mg | EXTENDED_RELEASE_TABLET | Freq: Three times a day (TID) | ORAL | 0 refills | Status: DC
  Filled 2021-01-04: qty 90, 30d supply, fill #0

## 2021-01-04 MED ORDER — MORPHINE SULFATE ER 15 MG PO TBCR
15.0000 mg | EXTENDED_RELEASE_TABLET | Freq: Three times a day (TID) | ORAL | 0 refills | Status: DC
  Filled 2021-02-17: qty 90, 30d supply, fill #0

## 2021-01-05 ENCOUNTER — Other Ambulatory Visit (HOSPITAL_COMMUNITY): Payer: Self-pay

## 2021-01-28 ENCOUNTER — Other Ambulatory Visit: Payer: Self-pay | Admitting: Family Medicine

## 2021-01-28 ENCOUNTER — Other Ambulatory Visit (HOSPITAL_COMMUNITY): Payer: Self-pay

## 2021-01-28 MED ORDER — OMEPRAZOLE 40 MG PO CPDR
40.0000 mg | DELAYED_RELEASE_CAPSULE | Freq: Every day | ORAL | 1 refills | Status: DC
  Filled 2021-01-28: qty 90, 90d supply, fill #0
  Filled 2021-04-27: qty 90, 90d supply, fill #1

## 2021-01-28 MED ORDER — FETZIMA 40 MG PO CP24
1.0000 | ORAL_CAPSULE | Freq: Every day | ORAL | 1 refills | Status: DC
  Filled 2021-01-28: qty 90, 90d supply, fill #0
  Filled 2021-05-03: qty 90, 90d supply, fill #1

## 2021-01-28 MED FILL — Primidone Tab 50 MG: ORAL | 90 days supply | Qty: 180 | Fill #0 | Status: AC

## 2021-02-10 ENCOUNTER — Encounter: Payer: Self-pay | Admitting: Family Medicine

## 2021-02-17 ENCOUNTER — Other Ambulatory Visit (HOSPITAL_COMMUNITY): Payer: Self-pay

## 2021-03-01 ENCOUNTER — Other Ambulatory Visit (HOSPITAL_COMMUNITY): Payer: Self-pay

## 2021-03-01 DIAGNOSIS — M21612 Bunion of left foot: Secondary | ICD-10-CM | POA: Diagnosis not present

## 2021-03-01 DIAGNOSIS — Z79899 Other long term (current) drug therapy: Secondary | ICD-10-CM | POA: Diagnosis not present

## 2021-03-01 DIAGNOSIS — G90522 Complex regional pain syndrome I of left lower limb: Secondary | ICD-10-CM | POA: Diagnosis not present

## 2021-03-01 DIAGNOSIS — G894 Chronic pain syndrome: Secondary | ICD-10-CM | POA: Diagnosis not present

## 2021-03-01 DIAGNOSIS — M792 Neuralgia and neuritis, unspecified: Secondary | ICD-10-CM | POA: Diagnosis not present

## 2021-03-01 DIAGNOSIS — Z5181 Encounter for therapeutic drug level monitoring: Secondary | ICD-10-CM | POA: Diagnosis not present

## 2021-03-01 MED ORDER — MORPHINE SULFATE 15 MG PO TABS
15.0000 mg | ORAL_TABLET | Freq: Two times a day (BID) | ORAL | 0 refills | Status: DC | PRN
  Filled 2021-03-01: qty 60, 30d supply, fill #0

## 2021-03-01 MED ORDER — MORPHINE SULFATE ER 15 MG PO TBCR
15.0000 mg | EXTENDED_RELEASE_TABLET | Freq: Three times a day (TID) | ORAL | 0 refills | Status: DC | PRN
  Filled 2021-03-19: qty 90, 30d supply, fill #0

## 2021-03-01 MED ORDER — MORPHINE SULFATE 15 MG PO TABS
15.0000 mg | ORAL_TABLET | Freq: Two times a day (BID) | ORAL | 0 refills | Status: DC | PRN
  Filled 2021-06-21 – 2021-07-12 (×2): qty 60, 30d supply, fill #0

## 2021-03-01 MED ORDER — MORPHINE SULFATE ER 15 MG PO TBCR
15.0000 mg | EXTENDED_RELEASE_TABLET | Freq: Three times a day (TID) | ORAL | 0 refills | Status: DC
  Filled 2021-04-20: qty 90, 30d supply, fill #0

## 2021-03-08 ENCOUNTER — Other Ambulatory Visit (HOSPITAL_COMMUNITY): Payer: Self-pay

## 2021-03-19 ENCOUNTER — Other Ambulatory Visit (HOSPITAL_COMMUNITY): Payer: Self-pay

## 2021-04-02 ENCOUNTER — Encounter: Payer: Self-pay | Admitting: Family Medicine

## 2021-04-13 ENCOUNTER — Other Ambulatory Visit (HOSPITAL_COMMUNITY): Payer: Self-pay

## 2021-04-16 ENCOUNTER — Encounter: Payer: Self-pay | Admitting: Family Medicine

## 2021-04-16 ENCOUNTER — Telehealth (INDEPENDENT_AMBULATORY_CARE_PROVIDER_SITE_OTHER): Payer: 59 | Admitting: Family Medicine

## 2021-04-16 VITALS — Temp 100.8°F

## 2021-04-16 DIAGNOSIS — B349 Viral infection, unspecified: Secondary | ICD-10-CM | POA: Diagnosis not present

## 2021-04-16 MED ORDER — OSELTAMIVIR PHOSPHATE 75 MG PO CAPS: 75.0000 mg | ORAL_CAPSULE | Freq: Two times a day (BID) | ORAL | 0 refills | Status: DC

## 2021-04-16 MED ORDER — PREDNISONE 20 MG PO TABS: 40.0000 mg | ORAL_TABLET | Freq: Every day | ORAL | 0 refills | Status: DC

## 2021-04-16 MED ORDER — PROMETHAZINE-DM 6.25-15 MG/5ML PO SYRP: 5.0000 mL | ORAL_SOLUTION | Freq: Two times a day (BID) | ORAL | 0 refills | Status: DC | PRN

## 2021-04-16 NOTE — Progress Notes (Signed)
Pt reports sxs began yesterday. She did a home covid test this was Negative. She had tested positive 3 weeks ago and cannot get the antigen test done.  She stated that her throat hurts. It hurts to talk,swallow, it feels like its nails in her throat. Really bad body aches, frontal headache mouth and teeth hurt. Imitrex doesn't help. She has been taking Tylenol, Mucinex DM

## 2021-04-16 NOTE — Progress Notes (Signed)
Virtual Visit via Video Note  I connected with [name] on 04/16/21 at  3:40 PM EDT by a video enabled telemedicine application and verified that I am speaking with the correct person using two identifiers.   I discussed the limitations of evaluation and management by telemedicine and the availability of in person appointments. The patient expressed understanding and agreed to proceed.  Patient location: at home Provider location: in office  Subjective:    CC: Cough   HPI: Had COVID 3 weeks ago.  She had head and chest congestion at that time and fever to 102..  She says she started to feel better for about a week though the cough persisted the whole time.  She started to feel worse again in the last couple of days.    Throat supper painful with "nails" in her throat on the left side. Bodyaches.  +fever.  HA severe and imitrex not working.  Mouth and teeth hurt.  Stomach cramping. No diarrhea or vomiting.    Past medical history, Surgical history, Family history not pertinant except as noted below, Social history, Allergies, and medications have been entered into the medical record, reviewed, and corrections made.    Objective:    General: Speaking clearly in complete sentences without any shortness of breath.  Alert and oriented x3.  Normal judgment. No apparent acute distress.    Impression and Recommendations:    No problem-specific Assessment & Plan notes found for this encounter.  Viral illness-unclear etiology at this point I do not know if she has had a second bout of COVID with a different strain.  Or if she is having a rebound phenomenon or if it could be a different viral illness such as adenovirus or influenza.  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  We will go ahead and treat with Tamiflu.  Also sent over prescription for prednisone since the sore throat is severe.  She says it is the worst sore throat she is ever had in  her life.  And also sent over Promethazine DM for cough.  No orders of the defined types were placed in this encounter.   Meds ordered this encounter  Medications   oseltamivir (TAMIFLU) 75 MG capsule    Sig: Take 1 capsule (75 mg total) by mouth 2 (two) times daily. X 10 days    Dispense:  10 capsule    Refill:  0   predniSONE (DELTASONE) 20 MG tablet    Sig: Take 2 tablets (40 mg total) by mouth daily with breakfast.    Dispense:  10 tablet    Refill:  0   promethazine-dextromethorphan (PROMETHAZINE-DM) 6.25-15 MG/5ML syrup    Sig: Take 5 mLs by mouth 2 (two) times daily as needed for cough.    Dispense:  180 mL    Refill:  0     I discussed the assessment and treatment plan with the patient. The patient was provided an opportunity to ask questions and all were answered. The patient agreed with the plan and demonstrated an understanding of the instructions.   The patient was advised to call back or seek an in-person evaluation if the symptoms worsen or if the condition fails to improve as anticipated.   Beatrice Lecher, MD

## 2021-04-20 ENCOUNTER — Other Ambulatory Visit (HOSPITAL_COMMUNITY): Payer: Self-pay

## 2021-04-20 ENCOUNTER — Encounter: Payer: Self-pay | Admitting: Family Medicine

## 2021-04-20 MED ORDER — CEFDINIR 300 MG PO CAPS: 300.0000 mg | ORAL_CAPSULE | Freq: Two times a day (BID) | ORAL | 0 refills | Status: DC

## 2021-04-20 NOTE — Telephone Encounter (Signed)
Meds ordered this encounter  Medications   cefdinir (OMNICEF) 300 MG capsule    Sig: Take 1 capsule (300 mg total) by mouth 2 (two) times daily.    Dispense:  14 capsule    Refill:  0

## 2021-04-21 ENCOUNTER — Other Ambulatory Visit (HOSPITAL_COMMUNITY): Payer: Self-pay

## 2021-04-21 MED ORDER — CEFDINIR 300 MG PO CAPS
300.0000 mg | ORAL_CAPSULE | Freq: Two times a day (BID) | ORAL | 0 refills | Status: DC
  Filled 2021-04-21: qty 14, 7d supply, fill #0

## 2021-04-21 NOTE — Telephone Encounter (Signed)
Cancelled RX with [facility] in [place].  Spoke with Estill Bamberg.  Charyl Bigger, CMA

## 2021-04-27 ENCOUNTER — Other Ambulatory Visit: Payer: Self-pay | Admitting: Family Medicine

## 2021-04-27 ENCOUNTER — Other Ambulatory Visit (HOSPITAL_COMMUNITY): Payer: Self-pay

## 2021-04-27 MED ORDER — PRIMIDONE 50 MG PO TABS
100.0000 mg | ORAL_TABLET | Freq: Every day | ORAL | 1 refills | Status: DC
  Filled 2021-04-27: qty 180, 90d supply, fill #0
  Filled 2021-07-27: qty 180, 90d supply, fill #1

## 2021-05-03 ENCOUNTER — Other Ambulatory Visit (HOSPITAL_COMMUNITY): Payer: Self-pay

## 2021-05-03 ENCOUNTER — Encounter: Payer: Self-pay | Admitting: Family Medicine

## 2021-05-03 MED ORDER — QUETIAPINE FUMARATE 25 MG PO TABS
75.0000 mg | ORAL_TABLET | Freq: Every day | ORAL | 1 refills | Status: DC
  Filled 2021-05-03 – 2021-05-05 (×2): qty 270, 90d supply, fill #0

## 2021-05-03 NOTE — Telephone Encounter (Signed)
Meds ordered this encounter  Medications   QUEtiapine (SEROQUEL) 25 MG tablet    Sig: Take 3 tablets (75 mg total) by mouth at bedtime.    Dispense:  270 tablet    Refill:  1

## 2021-05-05 ENCOUNTER — Other Ambulatory Visit (HOSPITAL_COMMUNITY): Payer: Self-pay

## 2021-05-06 ENCOUNTER — Other Ambulatory Visit (HOSPITAL_COMMUNITY): Payer: Self-pay

## 2021-05-06 DIAGNOSIS — G894 Chronic pain syndrome: Secondary | ICD-10-CM | POA: Diagnosis not present

## 2021-05-06 DIAGNOSIS — Z4542 Encounter for adjustment and management of neuropacemaker (brain) (peripheral nerve) (spinal cord): Secondary | ICD-10-CM | POA: Diagnosis not present

## 2021-05-06 DIAGNOSIS — T85112A Breakdown (mechanical) of implanted electronic neurostimulator (electrode) of spinal cord, initial encounter: Secondary | ICD-10-CM | POA: Diagnosis not present

## 2021-05-06 DIAGNOSIS — G90522 Complex regional pain syndrome I of left lower limb: Secondary | ICD-10-CM | POA: Diagnosis not present

## 2021-05-06 DIAGNOSIS — M792 Neuralgia and neuritis, unspecified: Secondary | ICD-10-CM | POA: Diagnosis not present

## 2021-05-06 DIAGNOSIS — M21612 Bunion of left foot: Secondary | ICD-10-CM | POA: Diagnosis not present

## 2021-05-06 MED ORDER — MORPHINE SULFATE 15 MG PO TABS
15.0000 mg | ORAL_TABLET | Freq: Two times a day (BID) | ORAL | 0 refills | Status: DC | PRN
  Filled 2021-05-06 – 2021-05-19 (×2): qty 60, 30d supply, fill #0

## 2021-05-06 MED ORDER — MORPHINE SULFATE ER 30 MG PO TBCR
30.0000 mg | EXTENDED_RELEASE_TABLET | Freq: Two times a day (BID) | ORAL | 0 refills | Status: DC
  Filled 2021-05-06: qty 60, 30d supply, fill #0
  Filled 2021-05-19: qty 51, 25d supply, fill #0
  Filled 2021-05-19: qty 9, 5d supply, fill #0

## 2021-05-19 ENCOUNTER — Other Ambulatory Visit (HOSPITAL_COMMUNITY): Payer: Self-pay

## 2021-05-20 ENCOUNTER — Other Ambulatory Visit (HOSPITAL_COMMUNITY): Payer: Self-pay

## 2021-06-16 ENCOUNTER — Other Ambulatory Visit (HOSPITAL_COMMUNITY): Payer: Self-pay

## 2021-06-16 DIAGNOSIS — H5213 Myopia, bilateral: Secondary | ICD-10-CM | POA: Diagnosis not present

## 2021-06-16 LAB — HM DIABETES EYE EXAM

## 2021-06-16 MED ORDER — RESTASIS 0.05 % OP EMUL
1.0000 [drp] | Freq: Two times a day (BID) | OPHTHALMIC | 3 refills | Status: DC
  Filled 2021-06-16: qty 180, 90d supply, fill #0

## 2021-06-21 ENCOUNTER — Other Ambulatory Visit (HOSPITAL_COMMUNITY): Payer: Self-pay

## 2021-06-21 MED ORDER — MORPHINE SULFATE ER 30 MG PO TBCR
30.0000 mg | EXTENDED_RELEASE_TABLET | Freq: Two times a day (BID) | ORAL | 0 refills | Status: DC
  Filled 2021-06-21: qty 60, 30d supply, fill #0

## 2021-07-07 DIAGNOSIS — H47093 Other disorders of optic nerve, not elsewhere classified, bilateral: Secondary | ICD-10-CM | POA: Diagnosis not present

## 2021-07-12 ENCOUNTER — Other Ambulatory Visit (HOSPITAL_COMMUNITY): Payer: Self-pay

## 2021-07-15 ENCOUNTER — Other Ambulatory Visit: Payer: Self-pay

## 2021-07-15 ENCOUNTER — Ambulatory Visit: Payer: 59 | Admitting: Family Medicine

## 2021-07-15 ENCOUNTER — Encounter: Payer: Self-pay | Admitting: Family Medicine

## 2021-07-15 ENCOUNTER — Other Ambulatory Visit (HOSPITAL_COMMUNITY): Payer: Self-pay

## 2021-07-15 VITALS — BP 128/69 | HR 94 | Ht 65.0 in | Wt 146.0 lb

## 2021-07-15 DIAGNOSIS — H47099 Other disorders of optic nerve, not elsewhere classified, unspecified eye: Secondary | ICD-10-CM

## 2021-07-15 DIAGNOSIS — H53459 Other localized visual field defect, unspecified eye: Secondary | ICD-10-CM

## 2021-07-15 DIAGNOSIS — F988 Other specified behavioral and emotional disorders with onset usually occurring in childhood and adolescence: Secondary | ICD-10-CM

## 2021-07-15 DIAGNOSIS — R42 Dizziness and giddiness: Secondary | ICD-10-CM

## 2021-07-15 DIAGNOSIS — F5104 Psychophysiologic insomnia: Secondary | ICD-10-CM | POA: Diagnosis not present

## 2021-07-15 DIAGNOSIS — G43009 Migraine without aura, not intractable, without status migrainosus: Secondary | ICD-10-CM

## 2021-07-15 MED ORDER — QUETIAPINE FUMARATE 50 MG PO TABS
100.0000 mg | ORAL_TABLET | Freq: Every day | ORAL | 1 refills | Status: DC
  Filled 2021-07-15: qty 180, 90d supply, fill #0
  Filled 2021-10-15: qty 180, 90d supply, fill #1

## 2021-07-15 MED ORDER — AMPHETAMINE-DEXTROAMPHETAMINE 20 MG PO TABS
20.0000 mg | ORAL_TABLET | Freq: Two times a day (BID) | ORAL | 0 refills | Status: DC
  Filled 2021-07-15: qty 60, 30d supply, fill #0
  Filled 2021-07-15: qty 180, 90d supply, fill #0
  Filled 2021-11-23: qty 60, 30d supply, fill #1
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
## 2021-07-16 MED ORDER — EMGALITY 120 MG/ML ~~LOC~~ SOAJ
240.0000 mg | Freq: Once | SUBCUTANEOUS | 1 refills | Status: AC
  Filled 2021-07-16: qty 2, 30d supply, fill #0
  Filled 2021-08-02: qty 1, 28d supply, fill #0

## 2021-07-16 NOTE — Assessment & Plan Note (Signed)
Not well controlled currently.  Will consider a trial of Emagality.  Has tried.Gabapentin, Topamax, Propranolol, Relpax and Imitrex.  F/U in 8 weeks after new start.

## 2021-07-16 NOTE — Assessment & Plan Note (Signed)
Increase seroquel to 100mg  nightly.

## 2021-07-16 NOTE — Assessment & Plan Note (Signed)
Take med occ but not daily.  RF sent today.

## 2021-07-20 ENCOUNTER — Other Ambulatory Visit (HOSPITAL_COMMUNITY): Payer: Self-pay

## 2021-07-20 DIAGNOSIS — G90522 Complex regional pain syndrome I of left lower limb: Secondary | ICD-10-CM | POA: Diagnosis not present

## 2021-07-20 DIAGNOSIS — G894 Chronic pain syndrome: Secondary | ICD-10-CM | POA: Diagnosis not present

## 2021-07-20 DIAGNOSIS — M21612 Bunion of left foot: Secondary | ICD-10-CM | POA: Diagnosis not present

## 2021-07-20 DIAGNOSIS — M792 Neuralgia and neuritis, unspecified: Secondary | ICD-10-CM | POA: Diagnosis not present

## 2021-07-20 DIAGNOSIS — T85112A Breakdown (mechanical) of implanted electronic neurostimulator (electrode) of spinal cord, initial encounter: Secondary | ICD-10-CM | POA: Diagnosis not present

## 2021-07-20 MED ORDER — MORPHINE SULFATE ER 30 MG PO TBCR
30.0000 mg | EXTENDED_RELEASE_TABLET | Freq: Three times a day (TID) | ORAL | 0 refills | Status: DC
  Filled 2021-07-20: qty 90, 30d supply, fill #0

## 2021-07-20 MED ORDER — MORPHINE SULFATE ER 30 MG PO TBCR
30.0000 mg | EXTENDED_RELEASE_TABLET | Freq: Three times a day (TID) | ORAL | 0 refills | Status: DC
  Filled 2021-08-19: qty 90, 30d supply, fill #0

## 2021-07-21 ENCOUNTER — Encounter: Payer: Self-pay | Admitting: Family Medicine

## 2021-07-21 DIAGNOSIS — M7062 Trochanteric bursitis, left hip: Secondary | ICD-10-CM

## 2021-07-21 NOTE — Telephone Encounter (Signed)
Referrals placed 

## 2021-07-27 ENCOUNTER — Other Ambulatory Visit (HOSPITAL_COMMUNITY): Payer: Self-pay

## 2021-07-27 ENCOUNTER — Other Ambulatory Visit: Payer: Self-pay | Admitting: Family Medicine

## 2021-07-27 MED ORDER — OMEPRAZOLE 40 MG PO CPDR
40.0000 mg | DELAYED_RELEASE_CAPSULE | Freq: Every day | ORAL | 1 refills | Status: DC
  Filled 2021-07-27: qty 90, 90d supply, fill #0
  Filled 2021-10-25: qty 90, 90d supply, fill #1

## 2021-08-02 ENCOUNTER — Other Ambulatory Visit (HOSPITAL_COMMUNITY): Payer: Self-pay

## 2021-08-04 ENCOUNTER — Other Ambulatory Visit: Payer: Self-pay | Admitting: Family Medicine

## 2021-08-04 ENCOUNTER — Other Ambulatory Visit (HOSPITAL_COMMUNITY): Payer: Self-pay

## 2021-08-04 MED ORDER — FETZIMA 40 MG PO CP24
1.0000 | ORAL_CAPSULE | Freq: Every day | ORAL | 1 refills | Status: DC
  Filled 2021-08-04: qty 90, 90d supply, fill #0
  Filled 2021-11-01: qty 90, 90d supply, fill #1

## 2021-08-05 ENCOUNTER — Encounter: Payer: Self-pay | Admitting: Orthopedic Surgery

## 2021-08-05 ENCOUNTER — Ambulatory Visit: Payer: 59 | Admitting: Orthopedic Surgery

## 2021-08-05 ENCOUNTER — Other Ambulatory Visit: Payer: Self-pay

## 2021-08-05 VITALS — BP 127/82 | HR 82 | Ht 65.0 in | Wt 145.2 lb

## 2021-08-05 DIAGNOSIS — M5137 Other intervertebral disc degeneration, lumbosacral region: Secondary | ICD-10-CM
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
## 2021-08-06 ENCOUNTER — Telehealth: Payer: Self-pay | Admitting: Family Medicine

## 2021-08-06 DIAGNOSIS — H47099 Other disorders of optic nerve, not elsewhere classified, unspecified eye: Secondary | ICD-10-CM

## 2021-08-06 DIAGNOSIS — G43009 Migraine without aura, not intractable, without status migrainosus: Secondary | ICD-10-CM

## 2021-08-06 DIAGNOSIS — M5136 Other intervertebral disc degeneration, lumbar region: Secondary | ICD-10-CM

## 2021-08-06 NOTE — Telephone Encounter (Signed)
-----   Message from Carole Civil, MD sent at 08/05/2021  4:34 PM EST ----- Dear Dr. Madilyn Fireman I have evaluated the patient for bursitis.  It appears that she does not have bursitis but radicular pain radiating from her L5-S1 disease and I would recommend that you send her for consultation with a spine specialist.

## 2021-08-06 NOTE — Telephone Encounter (Signed)
These call patient, I received a note from Dr. Aline Brochure stating that he did not think that the hip pain was really coming from your bursa he felt strongly that it was radiating from the lumbar spine particularly L5-S1.  And recommended that you see a spine specialist.  Are you interested in seeing an orthopedist spine specialist or maybe even a neurosurgeon?

## 2021-08-09 NOTE — Telephone Encounter (Signed)
LVM for pt to call to discuss.  T. Jelisha Weed, CMA  

## 2021-08-09 NOTE — Telephone Encounter (Signed)
Pt states that she cannot have an MRI because she has a stimulator in her back.  She states that she is only allowed to have a CT.  She needs an order for a CT of the brain.  She also would like to know if she should have any updated imaging on her spine since it has been since 2018 since she had any imaging done of her back.    Please advise.Charyl Bigger, CMA

## 2021-08-11 NOTE — Telephone Encounter (Signed)
Left detailed message advising of recommendations.  

## 2021-08-11 NOTE — Telephone Encounter (Signed)
CT ordered of the brain.  In regards to her back since she cannot have an MRI and really almost like her to see one of her orthopedist or back guys to make sure that we are ordering the best test to look at her spine.  She may need injected contrast.  And so just 1 to make sure that we really are ordering the right thing.  Please let me know if she has a preference for orthopedist, or if she wants to see someone like Dr. Darene Lamer who could make a recommendation I think that could be helpful as well.

## 2021-08-12 NOTE — Telephone Encounter (Signed)
[name] has agreed to go see another specialist. However, she has already seen orthopedic. The orthopedic suggested she see a spine specialist.   Note from Dr Aline Brochure.  I do not think she has hip bursitis  This seems to me to be a L5-S1 facet arthritis and L5-S1 degenerative disc disease with pain along the L5 and S1 dermatomes  Recommend consult with spine which I will let Dr. Madilyn Fireman know that the patient needs referral for.

## 2021-08-12 NOTE — Telephone Encounter (Signed)
Yes, please see the original note.  I recommended that she either see a neurosurgeon or an orthopedist who specializes in spine.  Not a general ortho  who does hips and knees.  I will place a referral for a neurosurgeon.  Orders Placed This Encounter  Procedures   CT HEAD W & WO CONTRAST (5MM)     CT of Brain due to possible MD or possible cerebral fluid leaking.    Standing Status:   Future    Standing Expiration Date:   08/10/2022    Order Specific Question:   If indicated for the ordered procedure, I authorize the administration of contrast media per Radiology protocol    Answer:   Yes    Order Specific Question:   Is patient pregnant?    Answer:   No    Order Specific Question:   Preferred imaging location?    Answer:   [facility]   Ambulatory referral to Neurosurgery    Referral Priority:   Routine    Referral Type:   Surgical    Referral Reason:   Specialty Services Required    Requested Specialty:   Neurosurgery    Number of Visits Requested:   1

## 2021-08-13 NOTE — Telephone Encounter (Signed)
Patient advised.

## 2021-08-17 ENCOUNTER — Ambulatory Visit (INDEPENDENT_AMBULATORY_CARE_PROVIDER_SITE_OTHER): Payer: 59

## 2021-08-17 ENCOUNTER — Other Ambulatory Visit: Payer: Self-pay

## 2021-08-17 DIAGNOSIS — H47099 Other disorders of optic nerve, not elsewhere classified, unspecified eye: Secondary | ICD-10-CM

## 2021-08-17 DIAGNOSIS — G43009 Migraine without aura, not intractable, without status migrainosus: Secondary | ICD-10-CM

## 2021-08-17 DIAGNOSIS — H469 Unspecified optic neuritis: Secondary | ICD-10-CM | POA: Diagnosis not present

## 2021-08-17 MED ORDER — IOHEXOL 300 MG/ML  SOLN
75.0000 mL | Freq: Once | INTRAMUSCULAR | Status: AC | PRN
  Administered 2021-08-17: 15:00:00 75 mL via INTRAVENOUS

## 2021-08-19 ENCOUNTER — Other Ambulatory Visit (HOSPITAL_COMMUNITY): Payer: Self-pay

## 2021-08-19 ENCOUNTER — Encounter: Payer: Self-pay | Admitting: Family Medicine

## 2021-08-19 NOTE — Progress Notes (Signed)
HI [name], good news in that everything looked OK on the scan.  I am not sure who your eye doctor is.  I was trying to look through my notes to remember who it was.  If they are an optician then we could always get you in with an ophthalmologist,MD eye doctor.  If you are already seeing an ophthalmologist then let me know so I can forward this report to them.  I can send it to your regular eye doctor to, just let me know.  I think it would be helpful for them.  Or they may want to see the images themselves, am not sure.

## 2021-08-20 ENCOUNTER — Other Ambulatory Visit (HOSPITAL_COMMUNITY): Payer: Self-pay

## 2021-08-20 MED ORDER — CARESTART COVID-19 HOME TEST VI KIT
PACK | 0 refills | Status: DC
  Filled 2021-08-20: qty 4, 4d supply, fill #0

## 2021-08-20 NOTE — Telephone Encounter (Signed)
Task completed. Recent CT results faxed to Dr. Hoyle Sauer at [phone]. Confirmation rec'd. Patient has been notified via Mychart message.

## 2021-08-20 NOTE — Telephone Encounter (Signed)
Pls send head Ct results to Dr. Jodi Mourning and let pt know when done

## 2021-08-24 DIAGNOSIS — M5442 Lumbago with sciatica, left side: Secondary | ICD-10-CM | POA: Diagnosis not present

## 2021-08-24 DIAGNOSIS — M25552 Pain in left hip: Secondary | ICD-10-CM | POA: Diagnosis not present

## 2021-08-26 ENCOUNTER — Other Ambulatory Visit: Payer: Self-pay | Admitting: Neurosurgery

## 2021-08-26 ENCOUNTER — Encounter: Payer: Self-pay | Admitting: Family Medicine

## 2021-08-26 ENCOUNTER — Other Ambulatory Visit (HOSPITAL_COMMUNITY): Payer: Self-pay | Admitting: Neurosurgery

## 2021-08-26 ENCOUNTER — Telehealth (INDEPENDENT_AMBULATORY_CARE_PROVIDER_SITE_OTHER): Payer: 59 | Admitting: Family Medicine

## 2021-08-26 ENCOUNTER — Other Ambulatory Visit (HOSPITAL_COMMUNITY): Payer: Self-pay

## 2021-08-26 VITALS — Ht 65.0 in | Wt 141.0 lb

## 2021-08-26 DIAGNOSIS — J0100 Acute maxillary sinusitis, unspecified: Secondary | ICD-10-CM

## 2021-08-26 DIAGNOSIS — M5442 Lumbago with sciatica, left side: Secondary | ICD-10-CM

## 2021-08-26 MED ORDER — DOXYCYCLINE HYCLATE 100 MG PO TABS
100.0000 mg | ORAL_TABLET | Freq: Two times a day (BID) | ORAL | 0 refills | Status: DC
  Filled 2021-08-26: qty 20, 10d supply, fill #0

## 2021-08-26 NOTE — Progress Notes (Signed)
° ° °  Virtual Visit via Video Note  I connected with [name] on 08/26/21 at  1:00 PM EST by a video enabled telemedicine application and verified that I am speaking with the correct person using two identifiers.   I discussed the limitations of evaluation and management by telemedicine and the availability of in person appointments. The patient expressed understanding and agreed to proceed.  Patient location: at home Provider location: in office  Subjective:    CC:   Chief Complaint  Patient presents with   Cough    Productive cough, nasal congestion, facial pressure, right ear pain. 2 weeks. Covid test negative 1 week ago.     HPI: Productive cough, nasal congestion, facial pressure, right ear pain. 2 weeks. Covid test negative 1 week ago.  She says it started out more like a cold but now she feels like it started more to a sinus infection she is getting a lot of discomfort near the nasal bridge and maxillary sinuses.  She is got a cough with productive white and green sputum.  Her right ear started to hurt.  She does feel little bit of short of breath with the cough but not in between.  Has had some swollen lymph nodes in the neck area   Past medical history, Surgical history, Family history not pertinant except as noted below, Social history, Allergies, and medications have been entered into the medical record, reviewed, and corrections made.    Objective:    General: Speaking clearly in complete sentences without any shortness of breath.  Alert and oriented x3.  Normal judgment. No apparent acute distress.    Impression and Recommendations:    Problem List Items Addressed This Visit   None Visit Diagnoses     Acute non-recurrent maxillary sinusitis    -  Primary   Relevant Medications   doxycycline (VIBRA-TABS) 100 MG tablet       No orders of the defined types were placed in this encounter.   Acute sinusitis-we will treat with doxycycline.  Call back if  not better after the weekend.  Okay to continue symptomatic care.  Meds ordered this encounter  Medications   doxycycline (VIBRA-TABS) 100 MG tablet    Sig: Take 1 tablet (100 mg total) by mouth 2 (two) times daily.    Dispense:  20 tablet    Refill:  0     I discussed the assessment and treatment plan with the patient. The patient was provided an opportunity to ask questions and all were answered. The patient agreed with the plan and demonstrated an understanding of the instructions.   The patient was advised to call back or seek an in-person evaluation if the symptoms worsen or if the condition fails to improve as anticipated.   Patricia Lecher, MD

## 2021-09-01 ENCOUNTER — Ambulatory Visit (HOSPITAL_COMMUNITY): Admission: RE | Admit: 2021-09-01 | Payer: 59 | Source: Ambulatory Visit

## 2021-09-01 ENCOUNTER — Inpatient Hospital Stay (HOSPITAL_COMMUNITY): Admission: RE | Admit: 2021-09-01 | Payer: 59 | Source: Ambulatory Visit

## 2021-09-02 ENCOUNTER — Ambulatory Visit (HOSPITAL_COMMUNITY): Payer: 59

## 2021-09-02 ENCOUNTER — Ambulatory Visit (HOSPITAL_COMMUNITY): Admission: RE | Admit: 2021-09-02 | Payer: 59 | Source: Ambulatory Visit

## 2021-09-02 ENCOUNTER — Encounter (HOSPITAL_COMMUNITY): Payer: Self-pay

## 2021-09-14 ENCOUNTER — Other Ambulatory Visit (HOSPITAL_COMMUNITY): Payer: Self-pay

## 2021-09-14 ENCOUNTER — Other Ambulatory Visit: Payer: Self-pay | Admitting: Family Medicine

## 2021-09-14 DIAGNOSIS — M792 Neuralgia and neuritis, unspecified: Secondary | ICD-10-CM | POA: Diagnosis not present

## 2021-09-14 DIAGNOSIS — Z79899 Other long term (current) drug therapy: Secondary | ICD-10-CM | POA: Diagnosis not present

## 2021-09-14 DIAGNOSIS — G894 Chronic pain syndrome: Secondary | ICD-10-CM | POA: Diagnosis not present

## 2021-09-14 DIAGNOSIS — Z5181 Encounter for therapeutic drug level monitoring: Secondary | ICD-10-CM | POA: Diagnosis not present

## 2021-09-14 DIAGNOSIS — M21612 Bunion of left foot: Secondary | ICD-10-CM | POA: Diagnosis not present

## 2021-09-14 DIAGNOSIS — G90522 Complex regional pain syndrome I of left lower limb: Secondary | ICD-10-CM | POA: Diagnosis not present

## 2021-09-14 DIAGNOSIS — T85112A Breakdown (mechanical) of implanted electronic neurostimulator (electrode) of spinal cord, initial encounter: Secondary | ICD-10-CM | POA: Diagnosis not present

## 2021-09-14 MED ORDER — LEVORPHANOL TARTRATE 2 MG PO TABS
2.0000 mg | ORAL_TABLET | Freq: Three times a day (TID) | ORAL | 0 refills | Status: DC | PRN
Start: 2021-09-14 — End: 2021-09-14
  Filled 2021-09-14: qty 90, 30d supply, fill #0

## 2021-09-15 ENCOUNTER — Other Ambulatory Visit (HOSPITAL_COMMUNITY): Payer: Self-pay

## 2021-09-15 MED ORDER — MORPHINE SULFATE ER 30 MG PO TBCR
30.0000 mg | EXTENDED_RELEASE_TABLET | Freq: Three times a day (TID) | ORAL | 0 refills | Status: DC
  Filled 2021-09-17: qty 21, 7d supply, fill #0

## 2021-09-15 MED ORDER — PROCHLORPERAZINE MALEATE 5 MG PO TABS
5.0000 mg | ORAL_TABLET | Freq: Three times a day (TID) | ORAL | 0 refills | Status: DC
  Filled 2021-09-15: qty 270, 90d supply, fill #0

## 2021-09-16 ENCOUNTER — Other Ambulatory Visit (HOSPITAL_COMMUNITY): Payer: Self-pay

## 2021-09-17 ENCOUNTER — Other Ambulatory Visit (HOSPITAL_COMMUNITY): Payer: Self-pay

## 2021-09-23 ENCOUNTER — Other Ambulatory Visit (HOSPITAL_COMMUNITY): Payer: Self-pay

## 2021-09-23 MED ORDER — MORPHINE SULFATE ER 30 MG PO TBCR
30.0000 mg | EXTENDED_RELEASE_TABLET | Freq: Three times a day (TID) | ORAL | 0 refills | Status: DC
Start: 2021-09-23 — End: 2021-10-20
  Filled 2021-09-23: qty 30, 10d supply, fill #0
  Filled 2021-09-24: qty 60, 20d supply, fill #0

## 2021-09-24 ENCOUNTER — Other Ambulatory Visit (HOSPITAL_COMMUNITY): Payer: Self-pay

## 2021-10-05 ENCOUNTER — Encounter: Payer: Self-pay | Admitting: Family Medicine

## 2021-10-05 ENCOUNTER — Other Ambulatory Visit (HOSPITAL_COMMUNITY): Payer: Self-pay

## 2021-10-05 MED ORDER — QUVIVIQ 25 MG PO TABS
1.0000 | ORAL_TABLET | Freq: Every day | ORAL | 1 refills | Status: DC
  Filled 2021-10-05: qty 30, 30d supply, fill #0
  Filled 2021-11-01: qty 30, 30d supply, fill #1

## 2021-10-06 ENCOUNTER — Other Ambulatory Visit (HOSPITAL_COMMUNITY): Payer: Self-pay

## 2021-10-15 ENCOUNTER — Other Ambulatory Visit (HOSPITAL_COMMUNITY): Payer: Self-pay

## 2021-10-20 ENCOUNTER — Other Ambulatory Visit (HOSPITAL_COMMUNITY): Payer: Self-pay

## 2021-10-20 DIAGNOSIS — G894 Chronic pain syndrome: Secondary | ICD-10-CM | POA: Diagnosis not present

## 2021-10-20 DIAGNOSIS — G90522 Complex regional pain syndrome I of left lower limb: Secondary | ICD-10-CM | POA: Diagnosis not present

## 2021-10-20 DIAGNOSIS — M21612 Bunion of left foot: Secondary | ICD-10-CM | POA: Diagnosis not present

## 2021-10-20 DIAGNOSIS — M792 Neuralgia and neuritis, unspecified: Secondary | ICD-10-CM | POA: Diagnosis not present

## 2021-10-20 MED ORDER — MORPHINE SULFATE 15 MG PO TABS
15.0000 mg | ORAL_TABLET | Freq: Two times a day (BID) | ORAL | 0 refills | Status: DC | PRN
  Filled 2021-10-20: qty 60, 30d supply, fill #0

## 2021-10-20 MED ORDER — MORPHINE SULFATE ER 30 MG PO TBCR
30.0000 mg | EXTENDED_RELEASE_TABLET | Freq: Three times a day (TID) | ORAL | 0 refills | Status: DC
  Filled 2021-11-23: qty 90, 30d supply, fill #0

## 2021-10-20 MED ORDER — MORPHINE SULFATE 15 MG PO TABS: 15.0000 mg | ORAL_TABLET | Freq: Two times a day (BID) | ORAL | 0 refills | Status: DC | PRN

## 2021-10-20 MED ORDER — MORPHINE SULFATE ER 30 MG PO TBCR
30.0000 mg | EXTENDED_RELEASE_TABLET | Freq: Three times a day (TID) | ORAL | 0 refills | Status: DC
  Filled 2021-10-20: qty 90, 30d supply, fill #0
  Filled 2021-10-25: qty 84, 28d supply, fill #0
  Filled 2021-10-25: qty 6, 2d supply, fill #0

## 2021-10-25 ENCOUNTER — Other Ambulatory Visit (HOSPITAL_COMMUNITY): Payer: Self-pay

## 2021-10-25 ENCOUNTER — Other Ambulatory Visit: Payer: Self-pay | Admitting: Family Medicine

## 2021-10-25 MED ORDER — PRIMIDONE 50 MG PO TABS
100.0000 mg | ORAL_TABLET | Freq: Every day | ORAL | 1 refills | Status: DC
Start: 2021-10-25 — End: 2022-05-02
  Filled 2021-10-25: qty 180, 90d supply, fill #0
  Filled 2022-01-24 – 2022-01-26 (×2): qty 180, 90d supply, fill #1

## 2021-11-01 ENCOUNTER — Other Ambulatory Visit (HOSPITAL_COMMUNITY): Payer: Self-pay

## 2021-11-23 ENCOUNTER — Other Ambulatory Visit: Payer: Self-pay | Admitting: Family Medicine

## 2021-11-23 ENCOUNTER — Other Ambulatory Visit (HOSPITAL_COMMUNITY): Payer: Self-pay

## 2021-11-23 DIAGNOSIS — F988 Other specified behavioral and emotional disorders with onset usually occurring in childhood and adolescence: Secondary | ICD-10-CM

## 2021-11-23 MED ORDER — AMPHETAMINE-DEXTROAMPHETAMINE 20 MG PO TABS
20.0000 mg | ORAL_TABLET | Freq: Two times a day (BID) | ORAL | 0 refills | Status: DC
  Filled 2021-11-23: qty 53, 26d supply, fill #0
  Filled 2021-11-23: qty 127, 64d supply, fill #0

## 2021-11-29 ENCOUNTER — Other Ambulatory Visit: Payer: Self-pay | Admitting: Family Medicine

## 2021-11-29 ENCOUNTER — Other Ambulatory Visit (HOSPITAL_COMMUNITY): Payer: Self-pay

## 2021-11-29 MED ORDER — QUVIVIQ 25 MG PO TABS
1.0000 | ORAL_TABLET | Freq: Every day | ORAL | 1 refills | Status: DC
Start: 2021-11-29 — End: 2022-02-01
  Filled 2021-11-29: qty 30, 30d supply, fill #0

## 2021-12-12 ENCOUNTER — Other Ambulatory Visit: Payer: Self-pay

## 2021-12-12 ENCOUNTER — Encounter: Payer: Self-pay | Admitting: Emergency Medicine

## 2021-12-12 ENCOUNTER — Ambulatory Visit
Admission: EM | Admit: 2021-12-12 | Discharge: 2021-12-12 | Disposition: A | Discharge status: Home / Self Care | Payer: 59 | Attending: Family Medicine | Admitting: Family Medicine

## 2021-12-12 ENCOUNTER — Ambulatory Visit (INDEPENDENT_AMBULATORY_CARE_PROVIDER_SITE_OTHER): Payer: 59

## 2021-12-12 DIAGNOSIS — R0602 Shortness of breath: Secondary | ICD-10-CM | POA: Diagnosis not present

## 2021-12-12 DIAGNOSIS — J189 Pneumonia, unspecified organism: Secondary | ICD-10-CM | POA: Diagnosis not present

## 2021-12-12 DIAGNOSIS — R062 Wheezing: Secondary | ICD-10-CM

## 2021-12-12 DIAGNOSIS — J069 Acute upper respiratory infection, unspecified: Secondary | ICD-10-CM | POA: Diagnosis not present

## 2021-12-12 DIAGNOSIS — R509 Fever, unspecified: Secondary | ICD-10-CM

## 2021-12-12 DIAGNOSIS — R059 Cough, unspecified: Secondary | ICD-10-CM | POA: Diagnosis not present

## 2021-12-12 MED ORDER — PREDNISONE 20 MG PO TABS: 40.0000 mg | ORAL_TABLET | Freq: Every day | ORAL | 0 refills | Status: DC

## 2021-12-12 MED ORDER — ALBUTEROL SULFATE HFA 108 (90 BASE) MCG/ACT IN AERS: 1.0000 | INHALATION_SPRAY | Freq: Four times a day (QID) | RESPIRATORY_TRACT | 0 refills | Status: DC | PRN

## 2021-12-12 MED ORDER — ALBUTEROL SULFATE (2.5 MG/3ML) 0.083% IN NEBU
2.5000 mg | INHALATION_SOLUTION | Freq: Once | RESPIRATORY_TRACT | Status: AC
  Administered 2021-12-12: 2.5 mg via RESPIRATORY_TRACT

## 2021-12-12 MED ORDER — DOXYCYCLINE HYCLATE 100 MG PO CAPS: 100.0000 mg | ORAL_CAPSULE | Freq: Two times a day (BID) | ORAL | 0 refills | Status: DC

## 2021-12-12 MED ORDER — PROMETHAZINE-DM 6.25-15 MG/5ML PO SYRP: 5.0000 mL | ORAL_SOLUTION | Freq: Four times a day (QID) | ORAL | 0 refills | Status: DC | PRN
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
## 2021-12-12 NOTE — ED Triage Notes (Addendum)
Pt reports nasal congestion and headache since Thursday. Pt reports "has gotten worse really quick." Pt reports bilateral ear fullness,body aches,productive cough with yellow phlegm, and reports continued fever. Last dose of tylenol 730 this am.

## 2021-12-13 LAB — COVID-19, FLU A+B NAA
Influenza A, NAA: NOT DETECTED
Influenza B, NAA: NOT DETECTED
SARS-CoV-2, NAA: NOT DETECTED

## 2021-12-16 ENCOUNTER — Other Ambulatory Visit: Payer: Self-pay

## 2021-12-16 ENCOUNTER — Encounter (HOSPITAL_COMMUNITY): Payer: Self-pay | Admitting: Emergency Medicine

## 2021-12-16 ENCOUNTER — Inpatient Hospital Stay (HOSPITAL_COMMUNITY)
Admission: EM | Admit: 2021-12-16 | Discharge: 2021-12-19 | DRG: 194 | Disposition: A | Discharge status: Home / Self Care | Payer: 59 | Attending: Family Medicine | Admitting: Family Medicine

## 2021-12-16 ENCOUNTER — Emergency Department (HOSPITAL_COMMUNITY): Payer: 59

## 2021-12-16 DIAGNOSIS — Z79899 Other long term (current) drug therapy: Secondary | ICD-10-CM | POA: Diagnosis not present

## 2021-12-16 DIAGNOSIS — Z880 Allergy status to penicillin: Secondary | ICD-10-CM

## 2021-12-16 DIAGNOSIS — E876 Hypokalemia: Secondary | ICD-10-CM | POA: Diagnosis present

## 2021-12-16 DIAGNOSIS — R9431 Abnormal electrocardiogram [ECG] [EKG]: Secondary | ICD-10-CM | POA: Diagnosis not present

## 2021-12-16 DIAGNOSIS — G43009 Migraine without aura, not intractable, without status migrainosus: Secondary | ICD-10-CM

## 2021-12-16 DIAGNOSIS — Z79891 Long term (current) use of opiate analgesic: Secondary | ICD-10-CM | POA: Diagnosis not present

## 2021-12-16 DIAGNOSIS — F32A Depression, unspecified: Secondary | ICD-10-CM | POA: Diagnosis not present

## 2021-12-16 DIAGNOSIS — Z20822 Contact with and (suspected) exposure to covid-19: Secondary | ICD-10-CM | POA: Diagnosis present

## 2021-12-16 DIAGNOSIS — G90529 Complex regional pain syndrome I of unspecified lower limb: Secondary | ICD-10-CM | POA: Diagnosis not present

## 2021-12-16 DIAGNOSIS — J189 Pneumonia, unspecified organism: Principal | ICD-10-CM | POA: Diagnosis present

## 2021-12-16 DIAGNOSIS — G250 Essential tremor: Secondary | ICD-10-CM | POA: Diagnosis not present

## 2021-12-16 DIAGNOSIS — E785 Hyperlipidemia, unspecified: Secondary | ICD-10-CM | POA: Diagnosis present

## 2021-12-16 DIAGNOSIS — K219 Gastro-esophageal reflux disease without esophagitis: Secondary | ICD-10-CM | POA: Diagnosis present

## 2021-12-16 DIAGNOSIS — R0602 Shortness of breath: Secondary | ICD-10-CM | POA: Diagnosis not present

## 2021-12-16 DIAGNOSIS — Z885 Allergy status to narcotic agent status: Secondary | ICD-10-CM

## 2021-12-16 DIAGNOSIS — F17210 Nicotine dependence, cigarettes, uncomplicated: Secondary | ICD-10-CM | POA: Diagnosis present

## 2021-12-16 DIAGNOSIS — G43909 Migraine, unspecified, not intractable, without status migrainosus: Secondary | ICD-10-CM | POA: Diagnosis not present

## 2021-12-16 DIAGNOSIS — Z882 Allergy status to sulfonamides status: Secondary | ICD-10-CM

## 2021-12-16 DIAGNOSIS — Z888 Allergy status to other drugs, medicaments and biological substances status: Secondary | ICD-10-CM | POA: Diagnosis not present

## 2021-12-16 LAB — CBC WITH DIFFERENTIAL/PLATELET
Abs Immature Granulocytes: 0.19 10*3/uL — ABNORMAL HIGH (ref 0.00–0.07)
Basophils Absolute: 0.1 10*3/uL (ref 0.0–0.1)
Basophils Relative: 0 %
Eosinophils Absolute: 0.2 10*3/uL (ref 0.0–0.5)
Eosinophils Relative: 1 %
HCT: 44.1 % (ref 36.0–46.0)
Hemoglobin: 14.6 g/dL (ref 12.0–15.0)
Immature Granulocytes: 1 %
Lymphocytes Relative: 10 %
Lymphs Abs: 2 10*3/uL (ref 0.7–4.0)
MCH: 28.7 pg (ref 26.0–34.0)
MCHC: 33.1 g/dL (ref 30.0–36.0)
MCV: 86.8 fL (ref 80.0–100.0)
Monocytes Absolute: 1.6 10*3/uL — ABNORMAL HIGH (ref 0.1–1.0)
Monocytes Relative: 8 %
Neutro Abs: 16 10*3/uL — ABNORMAL HIGH (ref 1.7–7.7)
Neutrophils Relative %: 80 %
Platelets: 429 10*3/uL — ABNORMAL HIGH (ref 150–400)
RBC: 5.08 MIL/uL (ref 3.87–5.11)
RDW: 12.4 % (ref 11.5–15.5)
WBC: 20 10*3/uL — ABNORMAL HIGH (ref 4.0–10.5)
nRBC: 0 % (ref 0.0–0.2)

## 2021-12-16 LAB — BASIC METABOLIC PANEL
Anion gap: 10 (ref 5–15)
BUN: 13 mg/dL (ref 6–20)
CO2: 29 mmol/L (ref 22–32)
Calcium: 8.7 mg/dL — ABNORMAL LOW (ref 8.9–10.3)
Chloride: 98 mmol/L (ref 98–111)
Creatinine, Ser: 0.63 mg/dL (ref 0.44–1.00)
GFR, Estimated: >60 mL/min (ref >60)
Glucose, Bld: 116 mg/dL — ABNORMAL HIGH (ref 70–99)
Potassium: 2.8 mmol/L — ABNORMAL LOW (ref 3.5–5.1)
Sodium: 137 mmol/L (ref 135–145)

## 2021-12-16 LAB — SARS CORONAVIRUS 2 BY RT PCR: SARS Coronavirus 2 by RT PCR: NEGATIVE

## 2021-12-16 MED ORDER — HYDROCODONE BIT-HOMATROP MBR 5-1.5 MG/5ML PO SOLN
5.0000 mL | Freq: Once | ORAL | Status: DC | PRN
Start: 2021-12-16 — End: 2021-12-17

## 2021-12-16 MED ORDER — ACETAMINOPHEN 325 MG PO TABS: 650.0000 mg | ORAL_TABLET | Freq: Four times a day (QID) | ORAL | Status: DC | PRN

## 2021-12-16 MED ORDER — SUMATRIPTAN SUCCINATE 50 MG PO TABS: 50.0000 mg | ORAL_TABLET | ORAL | Status: DC | PRN

## 2021-12-16 MED ORDER — ONDANSETRON HCL 4 MG/2ML IJ SOLN: 4.0000 mg | Freq: Four times a day (QID) | INTRAMUSCULAR | Status: DC | PRN

## 2021-12-16 MED ORDER — SODIUM CHLORIDE 0.9 % IV SOLN
2.0000 g | INTRAVENOUS | Status: DC
  Administered 2021-12-16 – 2021-12-18 (×3): 2 g via INTRAVENOUS
  Filled 2021-12-16 (×3): qty 20

## 2021-12-16 MED ORDER — LEVOMILNACIPRAN HCL ER 40 MG PO CP24
1.0000 | ORAL_CAPSULE | Freq: Every day | ORAL | Status: DC
  Administered 2021-12-18 – 2021-12-19 (×2): 1 via ORAL
  Filled 2021-12-16 (×3): qty 1

## 2021-12-16 MED ORDER — MORPHINE SULFATE ER 30 MG PO TBCR
30.0000 mg | EXTENDED_RELEASE_TABLET | Freq: Three times a day (TID) | ORAL | Status: DC
  Administered 2021-12-16 – 2021-12-19 (×8): 30 mg via ORAL
  Filled 2021-12-16 (×8): qty 1

## 2021-12-16 MED ORDER — ALBUTEROL SULFATE (2.5 MG/3ML) 0.083% IN NEBU
2.5000 mg | INHALATION_SOLUTION | Freq: Once | RESPIRATORY_TRACT | Status: AC
  Administered 2021-12-16: 2.5 mg via RESPIRATORY_TRACT
  Filled 2021-12-16: qty 3

## 2021-12-16 MED ORDER — POTASSIUM CHLORIDE 10 MEQ/100ML IV SOLN
10.0000 meq | INTRAVENOUS | Status: AC
  Administered 2021-12-16: 10 meq via INTRAVENOUS

## 2021-12-16 MED ORDER — IPRATROPIUM-ALBUTEROL 0.5-2.5 (3) MG/3ML IN SOLN: 3.0000 mL | RESPIRATORY_TRACT | Status: DC | PRN

## 2021-12-16 MED ORDER — SODIUM CHLORIDE 0.9 % IV BOLUS
500.0000 mL | Freq: Once | INTRAVENOUS | Status: AC
Start: 2021-12-16 — End: 2021-12-16
  Administered 2021-12-16: 500 mL via INTRAVENOUS

## 2021-12-16 MED ORDER — QUETIAPINE FUMARATE 100 MG PO TABS
100.0000 mg | ORAL_TABLET | Freq: Every day | ORAL | Status: DC
  Administered 2021-12-16 – 2021-12-18 (×3): 100 mg via ORAL
  Filled 2021-12-16 (×3): qty 1

## 2021-12-16 MED ORDER — POTASSIUM CHLORIDE 10 MEQ/100ML IV SOLN
10.0000 meq | INTRAVENOUS | Status: DC
  Filled 2021-12-16: qty 100

## 2021-12-16 MED ORDER — POTASSIUM CHLORIDE CRYS ER 20 MEQ PO TBCR
40.0000 meq | EXTENDED_RELEASE_TABLET | Freq: Once | ORAL | Status: AC
  Administered 2021-12-16: 40 meq via ORAL
  Filled 2021-12-16: qty 2

## 2021-12-16 MED ORDER — SODIUM CHLORIDE 0.9 % IV SOLN
500.0000 mg | INTRAVENOUS | Status: DC
  Filled 2021-12-16: qty 5

## 2021-12-16 MED ORDER — PRIMIDONE 50 MG PO TABS
100.0000 mg | ORAL_TABLET | Freq: Every day | ORAL | Status: DC
  Administered 2021-12-16 – 2021-12-18 (×3): 100 mg via ORAL
  Filled 2021-12-16 (×3): qty 2

## 2021-12-16 MED ORDER — PREDNISONE 20 MG PO TABS
40.0000 mg | ORAL_TABLET | Freq: Every day | ORAL | Status: DC
  Administered 2021-12-17: 40 mg via ORAL
  Filled 2021-12-16: qty 2

## 2021-12-16 MED ORDER — IPRATROPIUM-ALBUTEROL 0.5-2.5 (3) MG/3ML IN SOLN
3.0000 mL | Freq: Once | RESPIRATORY_TRACT | Status: AC
  Administered 2021-12-16: 3 mL via RESPIRATORY_TRACT
  Filled 2021-12-16: qty 3

## 2021-12-16 MED ORDER — SODIUM CHLORIDE 0.9 % IV SOLN
500.0000 mg | INTRAVENOUS | Status: DC
  Administered 2021-12-16 – 2021-12-18 (×3): 500 mg via INTRAVENOUS
  Filled 2021-12-16 (×2): qty 5

## 2021-12-16 MED ORDER — ENOXAPARIN SODIUM 40 MG/0.4ML IJ SOSY
40.0000 mg | PREFILLED_SYRINGE | INTRAMUSCULAR | Status: DC
  Administered 2021-12-16 – 2021-12-18 (×3): 40 mg via SUBCUTANEOUS
  Filled 2021-12-16 (×3): qty 0.4

## 2021-12-16 MED ORDER — PANTOPRAZOLE SODIUM 40 MG PO TBEC
40.0000 mg | DELAYED_RELEASE_TABLET | Freq: Every day | ORAL | Status: DC
Start: 2021-12-17 — End: 2021-12-19
  Administered 2021-12-17 – 2021-12-19 (×3): 40 mg via ORAL
  Filled 2021-12-16 (×3): qty 1

## 2021-12-16 NOTE — ED Notes (Signed)
Pt states that she was dx was PNA. Pt states tomorrow is her last day of taking the steroids and antibiotics. Pt states she is still having fevers up to 102 during the night and she has not been feeling better. Pt states "I thought I would be better by now."

## 2021-12-16 NOTE — Assessment & Plan Note (Addendum)
-  Continues to complain of shortness of breath, persistent cough, satting 95% on room air, -Temp 98.8, WBC 20 >> 19.2>> 16.3  - Failed outpatient treatment with doxycycline - Progression of right lung base pneumonia on chest x-ray - Start IV antibiotic>>  Rocephin Zithromax -to be continued - Strep and Legionella urine antigens - Culture sputum - As needed breathing treatments - Continue to monitor  -Continue mucolytic's and steroids

## 2021-12-16 NOTE — ED Provider Notes (Signed)
[facility] Provider Note   CSN: [id number] Arrival date & time: 12/16/21  1431     History  Chief Complaint  Patient presents with   Shortness of Breath    [name] is a 51 y.o. female with a history of recently diagnosed pneumonia, having been seen at a urgent care center 4 days ago , Diagnosed by chest x-ray and is currently on 40 mg of prednisone daily, along with an albuterol MDI and a prescription for doxycycline, presenting for evaluation of worsening shortness of breath, cough and generalized fatigue.  She additionally continues to have fevers that spike up to 102.5, this was last measured last night.  She has taken Tylenol for fever reduction, last dose was taken late morning today.  She states when she is trying to sleep she feels like she is going to drown secondary to all of the congestion, combination of nasal and postnasal drip but also with the thick frequent purulent sputum she is coughing up.  She also endorses wheezing which is not responding to the albuterol inhaler.  Patient is a smoker, but has cut way back recently, currently smoking less than half pack per day.  She has not smoked any in the past week.  She was COVID screened at her visit 4 days ago and was negative.  The history is provided by the patient.      Home Medications Prior to Admission medications   Medication Sig Start Date End Date Taking? Authorizing Provider  acyclovir (ZOVIRAX) 400 MG tablet TAKE ONE TABLET BY MOUTH THREE TIMES DAILY FOR 5 DAYS AS NEEDED FOR FLARES 12/15/20   Hali Marry, MD  albuterol (VENTOLIN HFA) 108 (90 Base) MCG/ACT inhaler Inhale 1-2 puffs into the lungs every 6 (six) hours as needed for wheezing or shortness of breath. 12/12/21   Volney American, PA-C  amphetamine-dextroamphetamine (ADDERALL) 20 MG tablet Take 1 tablet (20 mg total) by mouth 2 (two) times daily. 11/23/21   Hali Marry, MD  COVID-19 At Home Antigen Test  [facility] COVID-19 HOME TEST) KIT use as directed 08/20/21     Daridorexant HCl (QUVIVIQ) 25 MG TABS Take 1 tablet by mouth at bedtime. 11/29/21   Hali Marry, MD  doxycycline (VIBRA-TABS) 100 MG tablet Take 1 tablet (100 mg total) by mouth 2 (two) times daily. 08/26/21   Hali Marry, MD  doxycycline (VIBRAMYCIN) 100 MG capsule Take 1 capsule (100 mg total) by mouth 2 (two) times daily. 12/12/21   Volney American, PA-C  Levomilnacipran HCl ER (FETZIMA) 40 MG CP24 Take 1 capsule by mouth daily. 08/04/21   Hali Marry, MD  morphine (MS CONTIN) 30 MG 12 hr tablet Take 1 tablet (30 mg total) by mouth 3 (three) times daily. Max daily amount is 90 mg 07/20/21     morphine (MS CONTIN) 30 MG 12 hr tablet Take 1 tablet (30 mg total) by mouth 3 (three) times daily. Max daily dose is 90 mg. 08/19/21     morphine (MS CONTIN) 30 MG 12 hr tablet Take 1 tablet (30 mg total) by mouth 3 (three) times daily. 10/20/21     morphine (MS CONTIN) 30 MG 12 hr tablet Take 1 tablet (30 mg total) by mouth 3 (three) times daily. 11/19/21     morphine (MSIR) 15 MG tablet Take 1 tablet (15 mg total) by mouth 2 (two) times daily as needed for pain 10/20/21     morphine (MSIR) 15 MG  tablet Take 1 tablet (15 mg total) by mouth 2 (two) times daily as needed for pain 11/19/21     omeprazole (PRILOSEC) 40 MG capsule Take 1 capsule (40 mg total) by mouth daily. 07/27/21   Hali Marry, MD  predniSONE (DELTASONE) 20 MG tablet Take 2 tablets (40 mg total) by mouth daily with breakfast. 12/12/21   Volney American, PA-C  primidone (MYSOLINE) 50 MG tablet Take 2 tablets (100 mg total) by mouth at bedtime. 10/25/21   Hali Marry, MD  prochlorperazine (COMPAZINE) 5 MG tablet Take 1 tablet (5 mg total) by mouth 3 (three) times daily. 09/15/21   Hali Marry, MD  promethazine-dextromethorphan (PROMETHAZINE-DM) 6.25-15 MG/5ML syrup Take 5 mLs by mouth 4 (four) times daily as needed. 12/12/21    Volney American, PA-C  QUEtiapine (SEROQUEL) 50 MG tablet Take 2 tablets (100 mg total) by mouth at bedtime. 07/15/21   Hali Marry, MD  RESTASIS 0.05 % ophthalmic emulsion Place 1 drop into both eyes 2 (two) times daily as directed Patient taking differently: Place 1 drop into both eyes at bedtime. 06/16/21     SUMAtriptan (IMITREX) 50 MG tablet Take 1 tablet (50 mg total) by mouth at onset of headache. May repeat in 2 hours if needed. 12/08/20   Hali Marry, MD      Allergies    Chantix [varenicline], Gabapentin, Lyrica [pregabalin], Topamax [topiramate], Clavulanic acid, Codeine, Methadone, Other, Ranitidine hcl, Sulfa antibiotics, Hydrocodone-acetaminophen, Penicillins, Shingrix [zoster vac recomb adjuvanted], Tagamet [cimetidine], Tessalon [benzonatate], and Trazodone and nefazodone    Review of Systems   Review of Systems  Constitutional:  Positive for chills, fatigue and fever.  HENT:  Positive for postnasal drip and rhinorrhea. Negative for congestion and sore throat.   Eyes: Negative.   Respiratory:  Positive for cough, chest tightness, shortness of breath and wheezing.   Cardiovascular:  Negative for chest pain.  Gastrointestinal:  Negative for abdominal pain, nausea and vomiting.  Genitourinary: Negative.   Musculoskeletal:  Negative for arthralgias, joint swelling and neck pain.  Skin: Negative.  Negative for rash and wound.  Neurological:  Negative for dizziness, weakness, light-headedness, numbness and headaches.  Psychiatric/Behavioral: Negative.     Physical Exam Updated Vital Signs BP 116/83   Pulse (S) (!) 118 Comment: ambulating  Temp 98.1 F (36.7 C) (Oral)   Resp (!) 21   Ht 5' 5.5" (1.664 m)   Wt 64.4 kg   SpO2 (S) 92%   BMI 23.27 kg/m  Physical Exam Vitals and nursing note reviewed.  Constitutional:      Appearance: She is well-developed. She is not toxic-appearing.     Comments: Appears fatigued.  Frequent wet cough productive  of yellow/green sputum.  HENT:     Head: Normocephalic and atraumatic.  Eyes:     Conjunctiva/sclera: Conjunctivae normal.  Cardiovascular:     Rate and Rhythm: Normal rate and regular rhythm.     Heart sounds: Normal heart sounds.  Pulmonary:     Effort: Pulmonary effort is normal.     Breath sounds: Examination of the left-upper field reveals decreased breath sounds. Examination of the left-middle field reveals decreased breath sounds and rhonchi. Decreased breath sounds, wheezing and rhonchi present. No rales.  Chest:     Chest wall: No tenderness.  Abdominal:     General: Bowel sounds are normal.     Palpations: Abdomen is soft.     Tenderness: There is no abdominal tenderness.  Musculoskeletal:  General: Normal range of motion.     Cervical back: Normal range of motion.  Skin:    General: Skin is warm and dry.  Neurological:     General: No focal deficit present.     Mental Status: She is alert.    ED Results / Procedures / Treatments   Labs (all labs ordered are listed, but only abnormal results are displayed) Labs Reviewed  CBC WITH DIFFERENTIAL/PLATELET - Abnormal; Notable for the following components:      Result Value   WBC 20.0 (*)    Platelets 429 (*)    Neutro Abs 16.0 (*)    Monocytes Absolute 1.6 (*)    Abs Immature Granulocytes 0.19 (*)    All other components within normal limits  BASIC METABOLIC PANEL - Abnormal; Notable for the following components:   Potassium 2.8 (*)    Glucose, Bld 116 (*)    Calcium 8.7 (*)    All other components within normal limits  SARS CORONAVIRUS 2 BY RT PCR    EKG None  Radiology DG Chest 2 View  Result Date: 12/16/2021 CLINICAL DATA:  Short of breath EXAM: CHEST - 2 VIEW COMPARISON:  12/12/2021 FINDINGS: Airspace disease in the right lung base slightly more prominent. Possible mild left lower lobe airspace disease also noted. Heart size and vascularity normal.  No pleural effusion. Spinal cord stimulator lower  thoracic spine unchanged. IMPRESSION: Mild progression of right lower lobe airspace disease possible pneumonia. Question mild left lower lobe airspace disease. Electronically Signed   By: Franchot Gallo M.D.   On: 12/16/2021 15:54    Procedures Procedures    Medications Ordered in ED Medications  cefTRIAXone (ROCEPHIN) 2 g in sodium chloride 0.9 % 100 mL IVPB (has no administration in time range)  azithromycin (ZITHROMAX) 500 mg in sodium chloride 0.9 % 250 mL IVPB (has no administration in time range)  albuterol (PROVENTIL) (2.5 MG/3ML) 0.083% nebulizer solution 2.5 mg (2.5 mg Nebulization Given 12/16/21 1731)  ipratropium-albuterol (DUONEB) 0.5-2.5 (3) MG/3ML nebulizer solution 3 mL (3 mLs Nebulization Given 12/16/21 1731)  potassium chloride SA (KLOR-CON M) CR tablet 40 mEq (40 mEq Oral Given 12/16/21 1911)    ED Course/ Medical Decision Making/ A&P                           Medical Decision Making Patient who is currently on day 5 of doxycycline and prednisone for treatment of community-acquired pneumonia with increased shortness of breath, tachypnea and generalized fatigue.  She was seen at a urgent care center initially and placed on doxycycline and prednisone.  Patient states that she feels worse than when she was initially seen on Sunday.  She was COVID tested that day and was negative, today's COVID test is being repeated as she will need admission.  She was given albuterol and Atrovent neb here and she had no improvement in her work of breathing.  She is not significantly wheezing however after the treatment was given.  Fairly tachycardic at reexam, however she had just received a neb treatment.  She was ambulated and pulse ox dropped to 91% and she became tachypneic.  She has failed outpatient treatment and would benefit from admission.  Amount and/or Complexity of Data Reviewed Labs: ordered.    Details: Lab tests are significant for WBC count of 20,000, it is noted that she is  currently on 40 mg of prednisone daily.  She last took this medication this  morning.  She also has hypokalemia with a potassium of 2.8. Radiology: ordered.    Details: Chest x-ray indicates progression of a right lower lobe pneumonia. Discussion of management or test interpretation with external provider(s): Call placed to the hospitalist for admission.  Discussed with Dr. Clearence Ped who accepts pt for admission.  Risk Prescription drug management.           Final Clinical Impression(s) / ED Diagnoses Final diagnoses:  Community acquired pneumonia of right lower lobe of lung  Hypokalemia    Rx / DC Orders ED Discharge Orders     None         Landis Martins 12/16/21 Doran Heater    Fredia Sorrow, MD 12/31/21 1535

## 2021-12-16 NOTE — Assessment & Plan Note (Signed)
Continue PPI ?

## 2021-12-16 NOTE — Assessment & Plan Note (Addendum)
-   Potassium 2.8 - 40 mEq p.o. potassium given in the ED - Give 4 more rounds of potassium IV >>> could not tolerate due to burning -Another dose of 40 mg p.o. ordered - Monitor on telemetry

## 2021-12-16 NOTE — ED Triage Notes (Signed)
Pt diagnosed with Pneumonia on  Sunday and has completed most of ABT and still doesn't feel any better.

## 2021-12-16 NOTE — Assessment & Plan Note (Addendum)
-   Continue as needed sumatriptan -Stable

## 2021-12-16 NOTE — Assessment & Plan Note (Addendum)
-  Resolved - Most likely secondary to hypokalemia - Monitor on telemetry - Correct hypokalemia

## 2021-12-16 NOTE — Assessment & Plan Note (Addendum)
-   Continue Mysoline -Stable
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
## 2021-12-17 DIAGNOSIS — Z20822 Contact with and (suspected) exposure to covid-19: Secondary | ICD-10-CM | POA: Diagnosis present

## 2021-12-17 DIAGNOSIS — E785 Hyperlipidemia, unspecified: Secondary | ICD-10-CM | POA: Diagnosis present

## 2021-12-17 DIAGNOSIS — Z885 Allergy status to narcotic agent status: Secondary | ICD-10-CM | POA: Diagnosis not present

## 2021-12-17 DIAGNOSIS — F32A Depression, unspecified: Secondary | ICD-10-CM | POA: Diagnosis present

## 2021-12-17 DIAGNOSIS — G90529 Complex regional pain syndrome I of unspecified lower limb: Secondary | ICD-10-CM | POA: Diagnosis present

## 2021-12-17 DIAGNOSIS — G250 Essential tremor: Secondary | ICD-10-CM | POA: Diagnosis not present

## 2021-12-17 DIAGNOSIS — G43909 Migraine, unspecified, not intractable, without status migrainosus: Secondary | ICD-10-CM | POA: Diagnosis present

## 2021-12-17 DIAGNOSIS — E876 Hypokalemia: Secondary | ICD-10-CM | POA: Diagnosis not present

## 2021-12-17 DIAGNOSIS — Z882 Allergy status to sulfonamides status: Secondary | ICD-10-CM | POA: Diagnosis not present

## 2021-12-17 DIAGNOSIS — Z888 Allergy status to other drugs, medicaments and biological substances status: Secondary | ICD-10-CM | POA: Diagnosis not present

## 2021-12-17 DIAGNOSIS — K219 Gastro-esophageal reflux disease without esophagitis: Secondary | ICD-10-CM | POA: Diagnosis not present

## 2021-12-17 DIAGNOSIS — Z79899 Other long term (current) drug therapy: Secondary | ICD-10-CM | POA: Diagnosis not present

## 2021-12-17 DIAGNOSIS — G43009 Migraine without aura, not intractable, without status migrainosus: Secondary | ICD-10-CM | POA: Diagnosis not present

## 2021-12-17 DIAGNOSIS — Z79891 Long term (current) use of opiate analgesic: Secondary | ICD-10-CM | POA: Diagnosis not present

## 2021-12-17 DIAGNOSIS — F17210 Nicotine dependence, cigarettes, uncomplicated: Secondary | ICD-10-CM | POA: Diagnosis present

## 2021-12-17 DIAGNOSIS — Z880 Allergy status to penicillin: Secondary | ICD-10-CM | POA: Diagnosis not present

## 2021-12-17 DIAGNOSIS — J189 Pneumonia, unspecified organism: Secondary | ICD-10-CM | POA: Diagnosis not present

## 2021-12-17 DIAGNOSIS — R9431 Abnormal electrocardiogram [ECG] [EKG]: Secondary | ICD-10-CM | POA: Diagnosis not present

## 2021-12-17 LAB — EXPECTORATED SPUTUM ASSESSMENT W GRAM STAIN, RFLX TO RESP C

## 2021-12-17 LAB — COMPREHENSIVE METABOLIC PANEL
ALT: 16 U/L (ref 0–44)
AST: 12 U/L — ABNORMAL LOW (ref 15–41)
Albumin: 2.8 g/dL — ABNORMAL LOW (ref 3.5–5.0)
Alkaline Phosphatase: 85 U/L (ref 38–126)
Anion gap: 8 (ref 5–15)
BUN: 13 mg/dL (ref 6–20)
CO2: 29 mmol/L (ref 22–32)
Calcium: 8.2 mg/dL — ABNORMAL LOW (ref 8.9–10.3)
Chloride: 102 mmol/L (ref 98–111)
Creatinine, Ser: 0.52 mg/dL (ref 0.44–1.00)
GFR, Estimated: >60 mL/min (ref >60)
Glucose, Bld: 94 mg/dL (ref 70–99)
Potassium: 2.4 mmol/L — CL (ref 3.5–5.1)
Sodium: 139 mmol/L (ref 135–145)
Total Bilirubin: 0.2 mg/dL — ABNORMAL LOW (ref 0.3–1.2)
Total Protein: 5.7 g/dL — ABNORMAL LOW (ref 6.5–8.1)

## 2021-12-17 LAB — CBC WITH DIFFERENTIAL/PLATELET
Abs Immature Granulocytes: 0.31 10*3/uL — ABNORMAL HIGH (ref 0.00–0.07)
Basophils Absolute: 0.1 10*3/uL (ref 0.0–0.1)
Basophils Relative: 0 %
Eosinophils Absolute: 0.1 10*3/uL (ref 0.0–0.5)
Eosinophils Relative: 0 %
HCT: 34.4 % — ABNORMAL LOW (ref 36.0–46.0)
Hemoglobin: 11.2 g/dL — ABNORMAL LOW (ref 12.0–15.0)
Immature Granulocytes: 2 %
Lymphocytes Relative: 21 %
Lymphs Abs: 4 10*3/uL (ref 0.7–4.0)
MCH: 28.2 pg (ref 26.0–34.0)
MCHC: 32.6 g/dL (ref 30.0–36.0)
MCV: 86.6 fL (ref 80.0–100.0)
Monocytes Absolute: 1.6 10*3/uL — ABNORMAL HIGH (ref 0.1–1.0)
Monocytes Relative: 9 %
Neutro Abs: 13.2 10*3/uL — ABNORMAL HIGH (ref 1.7–7.7)
Neutrophils Relative %: 68 %
Platelets: 380 10*3/uL (ref 150–400)
RBC: 3.97 MIL/uL (ref 3.87–5.11)
RDW: 12.5 % (ref 11.5–15.5)
WBC: 19.2 10*3/uL — ABNORMAL HIGH (ref 4.0–10.5)
nRBC: 0 % (ref 0.0–0.2)

## 2021-12-17 LAB — HIV ANTIBODY (ROUTINE TESTING W REFLEX): HIV Screen 4th Generation wRfx: NONREACTIVE

## 2021-12-17 LAB — MAGNESIUM: Magnesium: 2.1 mg/dL (ref 1.7–2.4)

## 2021-12-17 LAB — STREP PNEUMONIAE URINARY ANTIGEN: Strep Pneumo Urinary Antigen: NEGATIVE

## 2021-12-17 LAB — PROCALCITONIN: Procalcitonin: <0.1 ng/mL

## 2021-12-17 MED ORDER — HYDROCODONE BIT-HOMATROP MBR 5-1.5 MG/5ML PO SOLN
5.0000 mL | Freq: Four times a day (QID) | ORAL | Status: DC | PRN
  Administered 2021-12-18: 5 mL via ORAL
  Filled 2021-12-17: qty 5

## 2021-12-17 MED ORDER — PREDNISONE 20 MG PO TABS
50.0000 mg | ORAL_TABLET | Freq: Every day | ORAL | Status: AC
  Administered 2021-12-18: 50 mg via ORAL
  Filled 2021-12-17: qty 1

## 2021-12-17 MED ORDER — IPRATROPIUM-ALBUTEROL 0.5-2.5 (3) MG/3ML IN SOLN
3.0000 mL | Freq: Once | RESPIRATORY_TRACT | Status: AC
  Administered 2021-12-17: 3 mL via RESPIRATORY_TRACT
  Filled 2021-12-17: qty 3

## 2021-12-17 MED ORDER — GUAIFENESIN-DM 100-10 MG/5ML PO SYRP
10.0000 mL | ORAL_SOLUTION | Freq: Three times a day (TID) | ORAL | Status: DC
  Administered 2021-12-17 – 2021-12-19 (×6): 10 mL via ORAL
  Filled 2021-12-17 (×6): qty 10

## 2021-12-17 MED ORDER — POTASSIUM CHLORIDE CRYS ER 20 MEQ PO TBCR
40.0000 meq | EXTENDED_RELEASE_TABLET | Freq: Once | ORAL | Status: AC
  Administered 2021-12-17: 40 meq via ORAL
  Filled 2021-12-17: qty 2

## 2021-12-17 MED ORDER — METHYLPREDNISOLONE SODIUM SUCC 40 MG IJ SOLR
40.0000 mg | Freq: Once | INTRAMUSCULAR | Status: AC
  Administered 2021-12-17: 40 mg via INTRAVENOUS
  Filled 2021-12-17: qty 1

## 2021-12-17 NOTE — Progress Notes (Signed)
Called into room, patient crying stating that IV was beyond burning with saline and potassium running together, paused pump. Advised I could slow the rate down and she still refused, states "I would just rather have it by mouth." Notified Dr Earnest Conroy. To make aware.

## 2021-12-17 NOTE — Hospital Course (Signed)
  [name] is a 51 y.o. female with medical history significant of CRPS, chronic headaches, depression, hyperlipidemia, GERD, essential tremor, and more presents to ED with chief complaint of "I have pneumonia."  Patient reports that she has had shortness of breath, cough, chest pain, fever, chills for 1 week today.  She had presented to the urgent care 4 days ago.  They prescribed her doxycycline, prednisone, and albuterol.  Patient reports that the prednisone and albuterol offered little to no relief.  At first albuterol helped her breathing, but then it did not make any difference after a couple of doses.  Patient reports that she has had a Tmax of 104 F on Sunday, and most recently at home she measured a fever of 102.7.  Patient reports that she has chest tightness that feels like an elephant sitting on her chest.  Her tightness is worse with a cough, but not necessarily worse with ambulation.  She complains of back pain "right where the long sit."  That pain feels like a stabbing pain.  It is worse with deep inspiration.  She has a cough that is productive of limegreen and white thick sputum.  She reports the sputum is in copious amounts.   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  Patient is a current smoker half a pack per day.  She reports she has not had a cigarette since the symptoms started.  She is ready to quit.  We discussed not doing a nicotine patch for cravings since she has not been smoking.  Patient has no other complaints at this time.  Patient does not drink alcohol, does not use illicit drugs, is vaccinated for COVID, is full code.

## 2021-12-17 NOTE — Progress Notes (Signed)
Date and time results received: 12/17/21 0637 Test: potassium Critical Value: 2.4 Name of Provider Notified: Dr. Roger Shelter Orders Received? Or Actions Taken?: awaiting new orders Deirdre Pippins, RN

## 2021-12-17 NOTE — Progress Notes (Signed)
PROGRESS NOTE    Patient: [name]                            PCP: Hali Marry, MD                    DOB: 12-17-1970            DOA: 12/16/2021 ZOX:[id number]             DOS: 12/17/2021, 11:34 AM   LOS: 0 days   Date of Service: The patient was seen and examined on 12/17/2021  Subjective:   The patient was seen and examined this morning. Stable at this time. Still complaining of : Persistent cough, shortness of breath, chest congestion Otherwise no issues overnight .  Brief Narrative:    [name] is a 51 y.o. female with medical history significant of CRPS, chronic headaches, depression, hyperlipidemia, GERD, essential tremor, and more presents to ED with chief complaint of "I have pneumonia."  Patient reports that she has had shortness of breath, cough, chest pain, fever, chills for 1 week today.  She had presented to the urgent care 4 days ago.  They prescribed her doxycycline, prednisone, and albuterol.  Patient reports that the prednisone and albuterol offered little to no relief.  At first albuterol helped her breathing, but then it did not make any difference after a couple of doses.  Patient reports that she has had a Tmax of 104 F on Sunday, and most recently at home she measured a fever of 102.7.  Patient reports that she has chest tightness that feels like an elephant sitting on her chest.  Her tightness is worse with a cough, but not necessarily worse with ambulation.  She complains of back pain "right where the long sit."  That pain feels like a stabbing pain.  It is worse with deep inspiration.  She has a cough that is productive of limegreen and white thick sputum.  She reports the sputum is in copious amounts.   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  Patient is a current smoker half a pack per day.  She reports she has not had a cigarette since the symptoms started.  She is ready to quit.  We discussed not doing a  nicotine patch for cravings since she has not been smoking.  Patient has no other complaints at this time.  Patient does not drink alcohol, does not use illicit drugs, is vaccinated for COVID, is full code.    Assessment & Plan:   Principal Problem:   CAP (community acquired pneumonia) Active Problems:   Migraine headache   Essential tremor   Hypokalemia   Prolonged QT interval   GERD (gastroesophageal reflux disease)     Assessment and Plan: * CAP (community acquired pneumonia) -Continue complain of cough, chest congestion, shortness of breath -Satting 93% on room air, -Temp 98.8, WBC 20 >> 19.2  - Failed outpatient treatment with doxycycline - Progression of right lung base pneumonia on chest x-ray - Start IV antibiotic>>  Rocephin Zithromax - Strep and Legionella urine antigens - Culture sputum - As needed breathing treatments - Continue to monitor  -We will add mucolytic's and quick taper steroids  GERD (gastroesophageal reflux disease) - Continue PPI  Prolonged QT interval - Most likely secondary to hypokalemia - Monitor on telemetry - Correct hypokalemia  Hypokalemia - Potassium 2.8 -  40 mEq p.o. potassium given in the ED - Give 4 more rounds of potassium IV >>> could not tolerate due to burning -Another dose of 40 mg p.o. ordered - Monitor on telemetry  Essential tremor - Continue Mysoline -Stable  Migraine headache - Continue as needed sumatriptan -Stable   ---------------------------------------------------------------------------------------------------------------- Nutritional status:  The patient's BMI is: Body mass index is 23.99 kg/m. I agree with the assessment and plan as outlined below: Nutrition Status:        --------------------------------------------------------------------------------------------------------------  DVT prophylaxis:  enoxaparin (LOVENOX) injection 40 mg Start: 12/16/21 2200 SCDs Start: 12/16/21  2035   Code Status:   Code Status: Full Code  Family Communication: No family member present at bedside- attempt will be made to update daily The above findings and plan of care has been discussed with patient (and family)  in detail,  they expressed understanding and agreement of above. -Advance care planning has been discussed.   Admission status:   Status is: Observation The patient remains OBS appropriate and will d/c before 2 midnights.     Procedures:   No admission procedures for hospital encounter.   Antimicrobials:  Anti-infectives (From admission, onward)    Start     Dose/Rate Route Frequency Ordered Stop   12/16/21 2200  azithromycin (ZITHROMAX) 500 mg in sodium chloride 0.9 % 250 mL IVPB        500 mg 250 mL/hr over 60 Minutes Intravenous Every 24 hours 12/16/21 2102     12/16/21 1930  cefTRIAXone (ROCEPHIN) 2 g in sodium chloride 0.9 % 100 mL IVPB        2 g 200 mL/hr over 30 Minutes Intravenous Every 24 hours 12/16/21 1922     12/16/21 1930  azithromycin (ZITHROMAX) 500 mg in sodium chloride 0.9 % 250 mL IVPB  Status:  Discontinued        500 mg 250 mL/hr over 60 Minutes Intravenous Every 24 hours 12/16/21 1922 12/16/21 2102        Medication:   enoxaparin (LOVENOX) injection  40 mg Subcutaneous Q24H   guaiFENesin-dextromethorphan  10 mL Oral Q8H   ipratropium-albuterol  3 mL Nebulization Once   Levomilnacipran HCl ER  1 capsule Oral Daily   methylPREDNISolone (SOLU-MEDROL) injection  40 mg Intravenous Once   morphine  30 mg Oral TID   pantoprazole  40 mg Oral Daily   [START ON 12/18/2021] predniSONE  50 mg Oral Q breakfast   primidone  100 mg Oral QHS   QUEtiapine  100 mg Oral QHS    acetaminophen, HYDROcodone bit-homatropine, ipratropium-albuterol, ondansetron (ZOFRAN) IV, SUMAtriptan   Objective:   Vitals:   12/16/21 2035 12/17/21 0034 12/17/21 0503 12/17/21 0913  BP: 134/78 114/66 122/75 118/80  Pulse: 87 84 84 89  Resp: '18 18 16    '$ Temp: 98.9 F (37.2 C) 98.3 F (36.8 C) 98.5 F (36.9 C) 98.9 F (37.2 C)  TempSrc: Oral Oral Oral Oral  SpO2: 97% 93% 93% 93%  Weight: 66.4 kg     Height: 5' 5.5" (1.664 m)       Intake/Output Summary (Last 24 hours) at 12/17/2021 1134 Last data filed at 12/17/2021 0900 Gross per 24 hour  Intake 486.85 ml  Output --  Net 486.85 ml   Filed Weights   12/16/21 1528 12/16/21 2035  Weight: 64.4 kg 66.4 kg     Examination:   Physical Exam  Constitution:  SOB with Coughing  Otherwise alert, cooperative, no distress,  Appears calm and comfortable  Psychiatric:   Normal and stable mood and affect, cognition intact,   HEENT:        Normocephalic, PERRL, otherwise with in Normal limits  Chest:         Chest symmetric Cardio vascular:  S1/S2, RRR, No murmure, No Rubs or Gallops  pulmonary: Clear to auscultation bilaterally, respirations unlabored, negative wheezes / crackles, diffuse rhonchi Abdomen: Soft, non-tender, non-distended, bowel sounds,no masses, no organomegaly Muscular skeletal: Limited exam - in bed, able to move all 4 extremities,   Neuro: CNII-XII intact. , normal motor and sensation, reflexes intact  Extremities: No pitting edema lower extremities, +2 pulses  Skin: Dry, warm to touch, negative for any Rashes, No open wounds Wounds: per nursing documentation   ------------------------------------------------------------------------------------------------------------------------------------------    LABs:     Latest Ref Rng & Units 12/17/2021    5:11 AM 12/16/2021    5:47 PM 12/11/2019   12:25 PM  CBC  WBC 4.0 - 10.5 K/uL 19.2   20.0   8.1    Hemoglobin 12.0 - 15.0 g/dL 11.2   14.6   13.6    Hematocrit 36.0 - 46.0 % 34.4   44.1   40.1    Platelets 150 - 400 K/uL 380   429   352        Latest Ref Rng & Units 12/17/2021    5:11 AM 12/16/2021    5:47 PM 12/11/2019   12:25 PM  CMP  Glucose 70 - 99 mg/dL 94   116   148    BUN 6 - 20 mg/dL '13   13   7     '$ Creatinine 0.44 - 1.00 mg/dL 0.52   0.63   0.73    Sodium 135 - 145 mmol/L 139   137   139    Potassium 3.5 - 5.1 mmol/L 2.4   2.8   4.5    Chloride 98 - 111 mmol/L 102   98   101    CO2 22 - 32 mmol/L '29   29   31    '$ Calcium 8.9 - 10.3 mg/dL 8.2   8.7   9.7    Total Protein 6.5 - 8.1 g/dL 5.7    6.9    Total Bilirubin 0.3 - 1.2 mg/dL 0.2    0.4    Alkaline Phos 38 - 126 U/L 85      AST 15 - 41 U/L 12    17    ALT 0 - 44 U/L 16    16         Micro Results Recent Results (from the past 240 hour(s))  Coronavirus (COVID-19) with Influenza A and Influenza B     Status: None   Collection Time: 12/12/21  9:29 AM   Specimen: Nasopharyngeal   Naso  Result Value Ref Range Status   SARS-CoV-2, NAA Not Detected Not Detected Final   Influenza A, NAA Not Detected Not Detected Final   Influenza B, NAA Not Detected Not Detected Final   Test Information: Comment  Final    Comment: This nucleic acid amplification test was developed and its performance characteristics determined by [employer]. Nucleic acid amplification tests include RT-PCR and TMA. This test has not been FDA cleared or approved. This test has been authorized by FDA under an Emergency Use Authorization (EUA). This test is only authorized for the duration of time the declaration that circumstances exist justifying the authorization of the emergency use of in vitro diagnostic tests for  detection of SARS-CoV-2 virus and/or diagnosis of COVID-19 infection under section 564(b)(1) of the Act, 21 U.S.C. 267TIW-5(Y) (1), unless the authorization is terminated or revoked sooner. When diagnostic testing is negative, the possibility of a false negative result should be considered in the context of a patient's recent exposures and the presence of clinical signs and symptoms consistent with COVID-19. An individual without symptoms of COVID-19 and who is not shedding SARS-CoV-2 virus wo uld expect to have a negative (not  detected) result in this assay.   SARS Coronavirus 2 by RT PCR (hospital order, performed in [facility] hospital lab) *cepheid single result test* Anterior Nasal Swab     Status: None   Collection Time: 12/16/21  7:06 PM   Specimen: Anterior Nasal Swab  Result Value Ref Range Status   SARS Coronavirus 2 by RT PCR NEGATIVE NEGATIVE Final    Comment: (NOTE) SARS-CoV-2 target nucleic acids are NOT DETECTED.  The SARS-CoV-2 RNA is generally detectable in upper and lower respiratory specimens during the acute phase of infection. The lowest concentration of SARS-CoV-2 viral copies this assay can detect is 250 copies / mL. A negative result does not preclude SARS-CoV-2 infection and should not be used as the sole basis for treatment or other patient management decisions.  A negative result may occur with improper specimen collection / handling, submission of specimen other than nasopharyngeal swab, presence of viral mutation(s) within the areas targeted by this assay, and inadequate number of viral copies (<250 copies / mL). A negative result must be combined with clinical observations, patient history, and epidemiological information.  Fact Sheet for Patients:   https://www.patel.info/  Fact Sheet for Healthcare Providers: https://hall.com/  This test is not yet approved or  cleared by the [place] FDA and has been authorized for detection and/or diagnosis of SARS-CoV-2 by FDA under an Emergency Use Authorization (EUA).  This EUA will remain in effect (meaning this test can be used) for the duration of the COVID-19 declaration under Section 564(b)(1) of the Act, 21 U.S.C. section 360bbb-3(b)(1), unless the authorization is terminated or revoked sooner.  Performed at [facility], [address], [place], [place] [address]   Expectorated Sputum Assessment w Gram Stain, Rflx to Resp Cult     Status: None   Collection Time: 12/17/21  9:33  AM   Specimen: Sputum  Result Value Ref Range Status   Specimen Description SPUTUM  Final   Special Requests NONE  Final   Sputum evaluation   Final    THIS SPECIMEN IS ACCEPTABLE FOR SPUTUM CULTURE Performed at [facility], [address], [place], [place] [address]    Report Status 12/17/2021 FINAL  Final    Radiology Reports DG Chest 2 View  Result Date: 12/16/2021 CLINICAL DATA:  Short of breath EXAM: CHEST - 2 VIEW COMPARISON:  12/12/2021 FINDINGS: Airspace disease in the right lung base slightly more prominent. Possible mild left lower lobe airspace disease also noted. Heart size and vascularity normal.  No pleural effusion. Spinal cord stimulator lower thoracic spine unchanged. IMPRESSION: Mild progression of right lower lobe airspace disease possible pneumonia. Question mild left lower lobe airspace disease. Electronically Signed   By: Franchot Gallo M.D.   On: 12/16/2021 15:54    SIGNED: Deatra James, MD, FHM. [facility],  Pager (please use [email or web address] to page/text) Please use Epic Secure Chat for non-urgent communication (7AM-7PM)  If 7PM-7AM, please contact night-coverage [email or web address], 12/17/2021, 11:34 AM

## 2021-12-17 NOTE — ED Notes (Signed)
  Transition of Care [facility]) Screening Note   Patient Details  Name: [name] Date of Birth: [birth year]   [facility] CM/SW Contact:    Iona Beard, [place] Phone Number: 12/17/2021, 11:11 AM    Transition of Care Department [facility]) has reviewed patient and no TOC needs have been identified at this time. We will continue to monitor patient advancement through interdisciplinary progression rounds. If new patient transition needs arise, please place a TOC consult.

## 2021-12-18 DIAGNOSIS — G250 Essential tremor: Secondary | ICD-10-CM | POA: Diagnosis not present

## 2021-12-18 DIAGNOSIS — G43009 Migraine without aura, not intractable, without status migrainosus: Secondary | ICD-10-CM | POA: Diagnosis not present

## 2021-12-18 DIAGNOSIS — K219 Gastro-esophageal reflux disease without esophagitis: Secondary | ICD-10-CM | POA: Diagnosis not present

## 2021-12-18 DIAGNOSIS — J189 Pneumonia, unspecified organism: Secondary | ICD-10-CM | POA: Diagnosis not present

## 2021-12-18 LAB — BASIC METABOLIC PANEL
Anion gap: 6 (ref 5–15)
BUN: 9 mg/dL (ref 6–20)
CO2: 30 mmol/L (ref 22–32)
Calcium: 8.4 mg/dL — ABNORMAL LOW (ref 8.9–10.3)
Chloride: 104 mmol/L (ref 98–111)
Creatinine, Ser: 0.63 mg/dL (ref 0.44–1.00)
GFR, Estimated: >60 mL/min (ref >60)
Glucose, Bld: 118 mg/dL — ABNORMAL HIGH (ref 70–99)
Potassium: 3.7 mmol/L (ref 3.5–5.1)
Sodium: 140 mmol/L (ref 135–145)

## 2021-12-18 LAB — CBC
HCT: 36.2 % (ref 36.0–46.0)
Hemoglobin: 11.9 g/dL — ABNORMAL LOW (ref 12.0–15.0)
MCH: 28.9 pg (ref 26.0–34.0)
MCHC: 32.9 g/dL (ref 30.0–36.0)
MCV: 87.9 fL (ref 80.0–100.0)
Platelets: 418 10*3/uL — ABNORMAL HIGH (ref 150–400)
RBC: 4.12 MIL/uL (ref 3.87–5.11)
RDW: 12.4 % (ref 11.5–15.5)
WBC: 16.3 10*3/uL — ABNORMAL HIGH (ref 4.0–10.5)
nRBC: 0 % (ref 0.0–0.2)

## 2021-12-18 MED ORDER — HOME MED STORE IN PYXIS: 1.0000 | Freq: Every day | Status: DC

## 2021-12-18 MED ORDER — METHYLPREDNISOLONE SODIUM SUCC 40 MG IJ SOLR
40.0000 mg | Freq: Once | INTRAMUSCULAR | Status: AC
  Administered 2021-12-18: 40 mg via INTRAVENOUS
  Filled 2021-12-18: qty 1

## 2021-12-18 NOTE — Progress Notes (Signed)
PROGRESS NOTE    Patient: [name]                            PCP: Hali Marry, MD                    DOB: September 14, 1970            DOA: 12/16/2021 TIR:[id number]             DOS: 12/18/2021, 10:46 AM   LOS: 1 day   Date of Service: The patient was seen and examined on 12/18/2021  Subjective:   The patient was seen and examined this morning, persistently coughing, with shortness of breath, especially with exertion diffuse wheezing and rhonchi noted  Remains afebrile, normotensive but satting only 95% on room air, improved WBC to 16.3  Brief Narrative:    [name] is a 51 y.o. female with medical history significant of CRPS, chronic headaches, depression, hyperlipidemia, GERD, essential tremor, and more presents to ED with chief complaint of "I have pneumonia."  Patient reports that she has had shortness of breath, cough, chest pain, fever, chills for 1 week today.  She had presented to the urgent care 4 days ago.  They prescribed her doxycycline, prednisone, and albuterol.  Patient reports that the prednisone and albuterol offered little to no relief.  At first albuterol helped her breathing, but then it did not make any difference after a couple of doses.  Patient reports that she has had a Tmax of 104 F on Sunday, and most recently at home she measured a fever of 102.7.  Patient reports that she has chest tightness that feels like an elephant sitting on her chest.  Her tightness is worse with a cough, but not necessarily worse with ambulation.  She complains of back pain "right where the long sit."  That pain feels like a stabbing pain.  It is worse with deep inspiration.  She has a cough that is productive of limegreen and white thick sputum.  She reports the sputum is in copious amounts.   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  Patient is a current smoker half a pack per day.  She reports she has not had a cigarette since the symptoms  started.  She is ready to quit.  We discussed not doing a nicotine patch for cravings since she has not been smoking.  Patient has no other complaints at this time.  Patient does not drink alcohol, does not use illicit drugs, is vaccinated for COVID, is full code.    Assessment & Plan:   Principal Problem:   CAP (community acquired pneumonia) Active Problems:   Migraine headache   Essential tremor   Hypokalemia   Prolonged QT interval   GERD (gastroesophageal reflux disease)     Assessment and Plan: * CAP (community acquired pneumonia) -Continues to complain of shortness of breath, persistent cough, satting 95% on room air, -Temp 98.8, WBC 20 >> 19.2>> 16.3  - Failed outpatient treatment with doxycycline - Progression of right lung base pneumonia on chest x-ray - Start IV antibiotic>>  Rocephin Zithromax -to be continued - Strep and Legionella urine antigens - Culture sputum - As needed breathing treatments - Continue to monitor  -Continue mucolytic's and steroids  GERD (gastroesophageal reflux disease) - Continue PPI  Prolonged QT interval - Most likely secondary to hypokalemia - Monitor on  telemetry - Correct hypokalemia  Hypokalemia - Potassium 2.8 >>> 3.7 today - Status post 40 mEq p.o. potassium given in the ED  - Monitor on telemetry  Essential tremor - Continue Mysoline -Stable  Migraine headache - Continue as needed sumatriptan -Stable   ---------------------------------------------------------------------------------------------------------------- Nutritional status:  The patient's BMI is: Body mass index is 23.99 kg/m. I agree with the assessment and plan as outlined below: Nutrition Status:        --------------------------------------------------------------------------------------------------------------  DVT prophylaxis:  enoxaparin (LOVENOX) injection 40 mg Start: 12/16/21 2200 SCDs Start: 12/16/21 2035   Code Status:   Code  Status: Full Code  Family Communication: No family member present at bedside- attempt will be made to update daily The above findings and plan of care has been discussed with patient (and family)  in detail,  they expressed understanding and agreement of above. -Advance care planning has been discussed.   Admission status:   Status is: Observation The patient remains OBS appropriate and will d/c before 2 midnights.     Procedures:   No admission procedures for hospital encounter.   Antimicrobials:  Anti-infectives (From admission, onward)    Start     Dose/Rate Route Frequency Ordered Stop   12/16/21 2200  azithromycin (ZITHROMAX) 500 mg in sodium chloride 0.9 % 250 mL IVPB        500 mg 250 mL/hr over 60 Minutes Intravenous Every 24 hours 12/16/21 2102     12/16/21 1930  cefTRIAXone (ROCEPHIN) 2 g in sodium chloride 0.9 % 100 mL IVPB        2 g 200 mL/hr over 30 Minutes Intravenous Every 24 hours 12/16/21 1922     12/16/21 1930  azithromycin (ZITHROMAX) 500 mg in sodium chloride 0.9 % 250 mL IVPB  Status:  Discontinued        500 mg 250 mL/hr over 60 Minutes Intravenous Every 24 hours 12/16/21 1922 12/16/21 2102        Medication:   enoxaparin (LOVENOX) injection  40 mg Subcutaneous Q24H   guaiFENesin-dextromethorphan  10 mL Oral Q8H   Levomilnacipran HCl ER  1 capsule Oral Daily   morphine  30 mg Oral TID   pantoprazole  40 mg Oral Daily   primidone  100 mg Oral QHS   QUEtiapine  100 mg Oral QHS    acetaminophen, HYDROcodone bit-homatropine, ipratropium-albuterol, ondansetron (ZOFRAN) IV, SUMAtriptan   Objective:   Vitals:   12/17/21 1157 12/17/21 1326 12/17/21 1953 12/18/21 0500  BP:  110/74 140/81 122/80  Pulse:  83 85 67  Resp:   20 18  Temp:  98.9 F (37.2 C) 98.8 F (37.1 C) 97.7 F (36.5 C)  TempSrc:  Oral Oral Oral  SpO2: 94% 97% 95% 95%  Weight:      Height:        Intake/Output Summary (Last 24 hours) at 12/18/2021 1046 Last data filed at  12/18/2021 1000 Gross per 24 hour  Intake 1150 ml  Output --  Net 1150 ml   Filed Weights   12/16/21 1528 12/16/21 2035  Weight: 64.4 kg 66.4 kg     Examination:      Physical Exam:   General:  AAO x 3,  cooperative, no distress;   HEENT:  Normocephalic, PERRL, otherwise with in Normal limits   Neuro:  CNII-XII intact. , normal motor and sensation, reflexes intact   Lungs:   Clear to auscultation BL, Respirations unlabored,  Diffuse wheezing, rhonchi  Cardio:    S1/S2, RRR, No  murmure, No Rubs or Gallops   Abdomen:  Soft, non-tender, bowel sounds active all four quadrants, no guarding or peritoneal signs.  Muscular  skeletal:  Limited exam -global generalized weaknesses - in bed, able to move all 4 extremities,   2+ pulses,  symmetric, No pitting edema  Skin:  Dry, warm to touch, negative for any Rashes,  Wounds: Please see nursing documentation        ----------------------------------------------------------------------------------------------------------------    LABs:     Latest Ref Rng & Units 12/18/2021    5:37 AM 12/17/2021    5:11 AM 12/16/2021    5:47 PM  CBC  WBC 4.0 - 10.5 K/uL 16.3   19.2   20.0    Hemoglobin 12.0 - 15.0 g/dL 11.9   11.2   14.6    Hematocrit 36.0 - 46.0 % 36.2   34.4   44.1    Platelets 150 - 400 K/uL 418   380   429        Latest Ref Rng & Units 12/18/2021    5:37 AM 12/17/2021    5:11 AM 12/16/2021    5:47 PM  CMP  Glucose 70 - 99 mg/dL 118   94   116    BUN 6 - 20 mg/dL '9   13   13    '$ Creatinine 0.44 - 1.00 mg/dL 0.63   0.52   0.63    Sodium 135 - 145 mmol/L 140   139   137    Potassium 3.5 - 5.1 mmol/L 3.7   2.4   2.8    Chloride 98 - 111 mmol/L 104   102   98    CO2 22 - 32 mmol/L '30   29   29    '$ Calcium 8.9 - 10.3 mg/dL 8.4   8.2   8.7    Total Protein 6.5 - 8.1 g/dL  5.7     Total Bilirubin 0.3 - 1.2 mg/dL  0.2     Alkaline Phos 38 - 126 U/L  85     AST 15 - 41 U/L  12     ALT 0 - 44 U/L  16          Micro  Results Recent Results (from the past 240 hour(s))  Coronavirus (COVID-19) with Influenza A and Influenza B     Status: None   Collection Time: 12/12/21  9:29 AM   Specimen: Nasopharyngeal   Naso  Result Value Ref Range Status   SARS-CoV-2, NAA Not Detected Not Detected Final   Influenza A, NAA Not Detected Not Detected Final   Influenza B, NAA Not Detected Not Detected Final   Test Information: Comment  Final    Comment: This nucleic acid amplification test was developed and its performance characteristics determined by [employer]. Nucleic acid amplification tests include RT-PCR and TMA. This test has not been FDA cleared or approved. This test has been authorized by FDA under an Emergency Use Authorization (EUA). This test is only authorized for the duration of time the declaration that circumstances exist justifying the authorization of the emergency use of in vitro diagnostic tests for detection of SARS-CoV-2 virus and/or diagnosis of COVID-19 infection under section 564(b)(1) of the Act, 21 U.S.C. 578ION-6(E) (1), unless the authorization is terminated or revoked sooner. When diagnostic testing is negative, the possibility of a false negative result should be considered in the context of a patient's recent exposures and the presence of clinical signs and symptoms consistent  with COVID-19. An individual without symptoms of COVID-19 and who is not shedding SARS-CoV-2 virus wo uld expect to have a negative (not detected) result in this assay.   SARS Coronavirus 2 by RT PCR (hospital order, performed in [facility] hospital lab) *cepheid single result test* Anterior Nasal Swab     Status: None   Collection Time: 12/16/21  7:06 PM   Specimen: Anterior Nasal Swab  Result Value Ref Range Status   SARS Coronavirus 2 by RT PCR NEGATIVE NEGATIVE Final    Comment: (NOTE) SARS-CoV-2 target nucleic acids are NOT DETECTED.  The SARS-CoV-2 RNA is generally detectable in upper and  lower respiratory specimens during the acute phase of infection. The lowest concentration of SARS-CoV-2 viral copies this assay can detect is 250 copies / mL. A negative result does not preclude SARS-CoV-2 infection and should not be used as the sole basis for treatment or other patient management decisions.  A negative result may occur with improper specimen collection / handling, submission of specimen other than nasopharyngeal swab, presence of viral mutation(s) within the areas targeted by this assay, and inadequate number of viral copies (<250 copies / mL). A negative result must be combined with clinical observations, patient history, and epidemiological information.  Fact Sheet for Patients:   https://www.patel.info/  Fact Sheet for Healthcare Providers: https://hall.com/  This test is not yet approved or  cleared by the [place] FDA and has been authorized for detection and/or diagnosis of SARS-CoV-2 by FDA under an Emergency Use Authorization (EUA).  This EUA will remain in effect (meaning this test can be used) for the duration of the COVID-19 declaration under Section 564(b)(1) of the Act, 21 U.S.C. section 360bbb-3(b)(1), unless the authorization is terminated or revoked sooner.  Performed at [facility], [address] [place], [place] [address]   Expectorated Sputum Assessment w Gram Stain, Rflx to Resp Cult     Status: None   Collection Time: 12/17/21  9:33 AM   Specimen: Sputum  Result Value Ref Range Status   Specimen Description SPUTUM  Final   Special Requests NONE  Final   Sputum evaluation   Final    THIS SPECIMEN IS ACCEPTABLE FOR SPUTUM CULTURE Performed at [facility], [address], [place], [place] [address]    Report Status 12/17/2021 FINAL  Final  Culture, Respiratory w Gram Stain     Status: None (Preliminary result)   Collection Time: 12/17/21  9:33 AM   Specimen: SPU  Result Value Ref Range  Status   Specimen Description   Final    SPUTUM Performed at [facility], [address], [place], [place] [address]    Special Requests   Final    NONE Reflexed from [id number] Performed at [facility], [address] [place], [place] [address]    Gram Stain NO WBC SEEN NO ORGANISMS SEEN   Final   Culture   Final    CULTURE REINCUBATED FOR BETTER GROWTH Performed at [facility], [place] [address], [place], [place] [address]    Report Status PENDING  Incomplete    Radiology Reports No results found.  SIGNED: Deatra James, MD, FHM. [facility],  Pager (please use [email or web address] to page/text) Please use Epic Secure Chat for non-urgent communication (7AM-7PM)  If 7PM-7AM, please contact night-coverage [email or web address], 12/18/2021, 10:46 AM

## 2021-12-19 DIAGNOSIS — K219 Gastro-esophageal reflux disease without esophagitis: Secondary | ICD-10-CM | POA: Diagnosis not present

## 2021-12-19 DIAGNOSIS — G250 Essential tremor: Secondary | ICD-10-CM | POA: Diagnosis not present

## 2021-12-19 DIAGNOSIS — E876 Hypokalemia: Secondary | ICD-10-CM | POA: Diagnosis not present

## 2021-12-19 DIAGNOSIS — J189 Pneumonia, unspecified organism: Secondary | ICD-10-CM | POA: Diagnosis not present

## 2021-12-19 LAB — CBC
HCT: 36 % (ref 36.0–46.0)
Hemoglobin: 11.8 g/dL — ABNORMAL LOW (ref 12.0–15.0)
MCH: 28.6 pg (ref 26.0–34.0)
MCHC: 32.8 g/dL (ref 30.0–36.0)
MCV: 87.4 fL (ref 80.0–100.0)
Platelets: 447 10*3/uL — ABNORMAL HIGH (ref 150–400)
RBC: 4.12 MIL/uL (ref 3.87–5.11)
RDW: 12.4 % (ref 11.5–15.5)
WBC: 16.5 10*3/uL — ABNORMAL HIGH (ref 4.0–10.5)
nRBC: 0 % (ref 0.0–0.2)

## 2021-12-19 LAB — BASIC METABOLIC PANEL
Anion gap: 4 — ABNORMAL LOW (ref 5–15)
BUN: 10 mg/dL (ref 6–20)
CO2: 32 mmol/L (ref 22–32)
Calcium: 8.5 mg/dL — ABNORMAL LOW (ref 8.9–10.3)
Chloride: 103 mmol/L (ref 98–111)
Creatinine, Ser: 0.61 mg/dL (ref 0.44–1.00)
GFR, Estimated: >60 mL/min (ref >60)
Glucose, Bld: 93 mg/dL (ref 70–99)
Potassium: 3.9 mmol/L (ref 3.5–5.1)
Sodium: 139 mmol/L (ref 135–145)

## 2021-12-19 MED ORDER — LEVOFLOXACIN 750 MG PO TABS: 750.0000 mg | ORAL_TABLET | Freq: Every day | ORAL | 0 refills | Status: AC

## 2021-12-19 MED ORDER — PREDNISONE 20 MG PO TABS
50.0000 mg | ORAL_TABLET | Freq: Every day | ORAL | Status: AC
  Administered 2021-12-19: 50 mg via ORAL
  Filled 2021-12-19: qty 1

## 2021-12-19 MED ORDER — AZITHROMYCIN 250 MG PO TABS: 500.0000 mg | ORAL_TABLET | Freq: Every day | ORAL | Status: DC

## 2021-12-19 MED ORDER — PROMETHAZINE-DM 6.25-15 MG/5ML PO SYRP: 5.0000 mL | ORAL_SOLUTION | Freq: Four times a day (QID) | ORAL | 0 refills | Status: DC | PRN

## 2021-12-19 MED ORDER — METHYLPREDNISOLONE 4 MG PO TBPK: ORAL_TABLET | ORAL | 0 refills | Status: DC

## 2021-12-19 NOTE — Discharge Summary (Signed)
Physician Discharge Summary   Patient: [name] MRN: [id number] DOB: [birth year]  Admit date:     12/16/2021  Discharge date: 12/19/21  Discharge Physician: Deatra James   PCP: Hali Marry, MD   Recommendations at discharge:    Follow-up with PCP within 1 week Continue current antibiotics and quick taper steroids Continue inhalers  Discharge Diagnoses: Principal Problem:   CAP (community acquired pneumonia) Active Problems:   Migraine headache   Essential tremor   Hypokalemia   Prolonged QT interval   GERD (gastroesophageal reflux disease)  Resolved Problems:   * No resolved hospital problems. *  Hospital Course:  [name] is a 51 y.o. female with medical history significant of CRPS, chronic headaches, depression, hyperlipidemia, GERD, essential tremor, and more presents to ED with chief complaint of "I have pneumonia."  Patient reports that she has had shortness of breath, cough, chest pain, fever, chills for 1 week today.  She had presented to the urgent care 4 days ago.  They prescribed her doxycycline, prednisone, and albuterol.  Patient reports that the prednisone and albuterol offered little to no relief.  At first albuterol helped her breathing, but then it did not make any difference after a couple of doses.  Patient reports that she has had a Tmax of 104 F on Sunday, and most recently at home she measured a fever of 102.7.  Patient reports that she has chest tightness that feels like an elephant sitting on her chest.  Her tightness is worse with a cough, but not necessarily worse with ambulation.  She complains of back pain "right where the long sit."  That pain feels like a stabbing pain.  It is worse with deep inspiration.  She has a cough that is productive of limegreen and white thick sputum.  She reports the sputum is in copious amounts.   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  Patient is a current smoker half  a pack per day.  She reports she has not had a cigarette since the symptoms started.  She is ready to quit.  We discussed not doing a nicotine patch for cravings since she has not been smoking.  Patient has no other complaints at this time.  Patient does not drink alcohol, does not use illicit drugs, is vaccinated for COVID, is full code.  Assessment and Plan: * CAP (community acquired pneumonia) -- Improved shortness of breath, continues to have cough  -Satting 93% on room air -Temp 98.8, WBC 20 >> 19.2>> 16.3, 16.5    - Failed outpatient treatment with doxycycline - Progression of right lung base pneumonia on chest x-ray - Start IV antibiotic>>  Rocephin Zithromax -switch to p.o. Levaquin - Strep and Legionella urine antigens - Culture sputum -was not obtained - Continue albuterol inhaler every 4-6 hours   -Continue mucolytic's and steroids-Medrol Dosepak ordered  GERD (gastroesophageal reflux disease) - Continue PPI  Prolonged QT interval -Resolved - Most likely secondary to hypokalemia - Monitor on telemetry - Correct hypokalemia  Hypokalemia - Potassium 2.8 >>> 3.7, 3.9  today - Status post 40 mEq p.o. potassium given in the ED    Essential tremor - Continue Mysoline -Stable  Migraine headache - Continue as needed sumatriptan -Stable   Consultants: none Procedures performed: none  Disposition: Home Diet recommendation:  Discharge Diet Orders (From admission, onward)     Start     Ordered   12/19/21 0000  Diet -  low sodium heart healthy        12/19/21 1038           Regular diet DISCHARGE MEDICATION: Allergies as of 12/19/2021       Reactions   Chantix [varenicline] Other (See Comments)   Mood changes, severe   Gabapentin Other (See Comments)   Memory problems   Lyrica [pregabalin] Other (See Comments)   Memory problems   Topamax [topiramate] Other (See Comments)   Memory loss   Clavulanic Acid    Codeine    Methadone Other (See Comments)    SVT   Other    Ranitidine Hcl    Sulfa Antibiotics    Hydrocodone-acetaminophen Nausea And Vomiting       Penicillins Rash       Shingrix [zoster Vac Recomb Adjuvanted] Rash   Tagamet [cimetidine] Rash   Tessalon [benzonatate] Rash   Trazodone And Nefazodone Other (See Comments)   Restless leg syndrome        Medication List     STOP taking these medications    acyclovir 400 MG tablet Commonly known as: ZOVIRAX   doxycycline 100 MG capsule Commonly known as: VIBRAMYCIN   doxycycline 100 MG tablet Commonly known as: VIBRA-TABS   predniSONE 20 MG tablet Commonly known as: DELTASONE   prochlorperazine 5 MG tablet Commonly known as: COMPAZINE       TAKE these medications    albuterol 108 (90 Base) MCG/ACT inhaler Commonly known as: VENTOLIN HFA Inhale 1-2 puffs into the lungs every 6 (six) hours as needed for wheezing or shortness of breath.   amphetamine-dextroamphetamine 20 MG tablet Commonly known as: ADDERALL Take 1 tablet (20 mg total) by mouth 2 (two) times daily.   Carestart COVID-19 Home Test Kit Generic drug: COVID-19 At Home Antigen Test use as directed   diclofenac Sodium 1 % Gel Commonly known as: VOLTAREN Apply topically 4 (four) times daily.   Fetzima 40 MG Cp24 Generic drug: Levomilnacipran HCl ER Take 1 capsule by mouth daily.   levofloxacin 750 MG tablet Commonly known as: Levaquin Take 1 tablet (750 mg total) by mouth daily for 4 days.   methylPREDNISolone 4 MG Tbpk tablet Commonly known as: MEDROL DOSEPAK Medrol Dosepak take as instructed   morphine 30 MG 12 hr tablet Commonly known as: MS CONTIN Take 1 tablet (30 mg total) by mouth 3 (three) times daily. Max daily amount is 90 mg What changed: Another medication with the same name was removed. Continue taking this medication, and follow the directions you see here.   morphine 15 MG tablet Commonly known as: MSIR Take 1 tablet (15 mg total) by mouth 2 (two) times daily as  needed for pain What changed: Another medication with the same name was removed. Continue taking this medication, and follow the directions you see here.   omeprazole 40 MG capsule Commonly known as: PRILOSEC Take 1 capsule (40 mg total) by mouth daily.   primidone 50 MG tablet Commonly known as: MYSOLINE Take 2 tablets (100 mg total) by mouth at bedtime.   promethazine-dextromethorphan 6.25-15 MG/5ML syrup Commonly known as: PROMETHAZINE-DM Take 5 mLs by mouth 4 (four) times daily as needed for up to 10 days.   QUEtiapine 50 MG tablet Commonly known as: SEROQUEL Take 2 tablets (100 mg total) by mouth at bedtime.   Quviviq 25 MG Tabs Generic drug: Daridorexant HCl Take 1 tablet by mouth at bedtime.   Restasis 0.05 % ophthalmic emulsion Generic drug: cycloSPORINE Place 1 drop  into both eyes 2 (two) times daily as directed What changed: when to take this   SUMAtriptan 50 MG tablet Commonly known as: IMITREX Take 1 tablet (50 mg total) by mouth at onset of headache. May repeat in 2 hours if needed.        Discharge Exam: Filed Weights   12/16/21 1528 12/16/21 2035  Weight: 64.4 kg 66.4 kg      Physical Exam:   General:  AAO x 3,  cooperative, no distress;   HEENT:  Normocephalic, PERRL, otherwise with in Normal limits   Neuro:  CNII-XII intact. , normal motor and sensation, reflexes intact   Lungs:   Clear to auscultation BL, Respirations unlabored,  Improved but diffuse wheezes / no  crackles  Cardio:    S1/S2, RRR, No murmure, No Rubs or Gallops   Abdomen:  Soft, non-tender, bowel sounds active all four quadrants, no guarding or peritoneal signs.  Muscular  skeletal:  Limited exam -global generalized weaknesses - in bed, able to move all 4 extremities,   2+ pulses,  symmetric, No pitting edema  Skin:  Dry, warm to touch, negative for any Rashes,  Wounds: Please see nursing documentation          Condition at discharge: good  The results of  significant diagnostics from this hospitalization (including imaging, microbiology, ancillary and laboratory) are listed below for reference.   Imaging Studies: DG Chest 2 View  Result Date: 12/16/2021 CLINICAL DATA:  Short of breath EXAM: CHEST - 2 VIEW COMPARISON:  12/12/2021 FINDINGS: Airspace disease in the right lung base slightly more prominent. Possible mild left lower lobe airspace disease also noted. Heart size and vascularity normal.  No pleural effusion. Spinal cord stimulator lower thoracic spine unchanged. IMPRESSION: Mild progression of right lower lobe airspace disease possible pneumonia. Question mild left lower lobe airspace disease. Electronically Signed   By: Franchot Gallo M.D.   On: 12/16/2021 15:54   DG Chest 2 View  Result Date: 12/12/2021 CLINICAL DATA:  Cough and fever EXAM: CHEST - 2 VIEW COMPARISON:  July 22, 2020 FINDINGS: There may be subtle infiltrate in the right base. The heart, hila, mediastinum, lungs, and pleura are otherwise unchanged and unremarkable. No pneumothorax. IMPRESSION: Suspected subtle infiltrate/pneumonia in the right base. Recommend short-term follow-up after treatment to ensure resolution. Electronically Signed   By: Dorise Bullion III M.D.   On: 12/12/2021 09:20    Microbiology: Results for orders placed or performed during the hospital encounter of 12/16/21  SARS Coronavirus 2 by RT PCR (hospital order, performed in [facility] hospital lab) *cepheid single result test* Anterior Nasal Swab     Status: None   Collection Time: 12/16/21  7:06 PM   Specimen: Anterior Nasal Swab  Result Value Ref Range Status   SARS Coronavirus 2 by RT PCR NEGATIVE NEGATIVE Final    Comment: (NOTE) SARS-CoV-2 target nucleic acids are NOT DETECTED.  The SARS-CoV-2 RNA is generally detectable in upper and lower respiratory specimens during the acute phase of infection. The lowest concentration of SARS-CoV-2 viral copies this assay can detect is 250 copies /  mL. A negative result does not preclude SARS-CoV-2 infection and should not be used as the sole basis for treatment or other patient management decisions.  A negative result may occur with improper specimen collection / handling, submission of specimen other than nasopharyngeal swab, presence of viral mutation(s) within the areas targeted by this assay, and inadequate number of viral copies (<250 copies / mL).  A negative result must be combined with clinical observations, patient history, and epidemiological information.  Fact Sheet for Patients:   https://www.patel.info/  Fact Sheet for Healthcare Providers: https://hall.com/  This test is not yet approved or  cleared by the [place] FDA and has been authorized for detection and/or diagnosis of SARS-CoV-2 by FDA under an Emergency Use Authorization (EUA).  This EUA will remain in effect (meaning this test can be used) for the duration of the COVID-19 declaration under Section 564(b)(1) of the Act, 21 U.S.C. section 360bbb-3(b)(1), unless the authorization is terminated or revoked sooner.  Performed at [facility], [address], [place], [place] [address]   Expectorated Sputum Assessment w Gram Stain, Rflx to Resp Cult     Status: None   Collection Time: 12/17/21  9:33 AM   Specimen: Sputum  Result Value Ref Range Status   Specimen Description SPUTUM  Final   Special Requests NONE  Final   Sputum evaluation   Final    THIS SPECIMEN IS ACCEPTABLE FOR SPUTUM CULTURE Performed at [facility], [address], [place], [place] [address]    Report Status 12/17/2021 FINAL  Final  Culture, Respiratory w Gram Stain     Status: None (Preliminary result)   Collection Time: 12/17/21  9:33 AM   Specimen: SPU  Result Value Ref Range Status   Specimen Description   Final    SPUTUM Performed at [facility], [address] [place], [place] [address]    Special Requests   Final    NONE  Reflexed from [id number] Performed at [facility], [address], [place], [place] [address]    Gram Stain NO WBC SEEN NO ORGANISMS SEEN   Final   Culture   Final    CULTURE REINCUBATED FOR BETTER GROWTH Performed at [facility], [place] [address] [place], [place] [address]    Report Status PENDING  Incomplete    Labs: CBC: Recent Labs  Lab 12/16/21 1747 12/17/21 0511 12/18/21 0537 12/19/21 0416  WBC 20.0* 19.2* 16.3* 16.5*  NEUTROABS 16.0* 13.2*  --   --   HGB 14.6 11.2* 11.9* 11.8*  HCT 44.1 34.4* 36.2 36.0  MCV 86.8 86.6 87.9 87.4  PLT 429* 380 418* 944*   Basic Metabolic Panel: Recent Labs  Lab 12/16/21 1747 12/17/21 0511 12/18/21 0537 12/19/21 0416  NA 137 139 140 139  K 2.8* 2.4* 3.7 3.9  CL 98 102 104 103  CO2 _0 32  GLUCOSE 116* 94 118* 93  BUN _1 CREATININE 0.63 0.52 0.63 0.61  CALCIUM 8.7* 8.2* 8.4* 8.5*  MG  --  2.1  --   --    Liver Function Tests: Recent Labs  Lab 12/17/21 0511  AST 12*  ALT 16  ALKPHOS 85  BILITOT 0.2*  PROT 5.7*  ALBUMIN 2.8*   CBG: No results for input(s): GLUCAP in the last 168 hours.  Discharge time spent: greater than 30 minutes.  Signed: Deatra James, MD [facility] 12/19/2021

## 2021-12-19 NOTE — Progress Notes (Signed)
Patient discharged home today, patient transported self home. Discharge paperwork went over with patient, patient verbalized understanding. Belongings sent home with patient. Patients home medication in pharmacy  returned to patient which included Levomilnacipran.

## 2021-12-21 LAB — CULTURE, RESPIRATORY W GRAM STAIN
Culture: NORMAL
Gram Stain: NONE SEEN

## 2021-12-21 LAB — LEGIONELLA PNEUMOPHILA SEROGP 1 UR AG: L. pneumophila Serogp 1 Ur Ag: NEGATIVE

## 2021-12-27 ENCOUNTER — Other Ambulatory Visit (HOSPITAL_COMMUNITY): Payer: Self-pay

## 2021-12-27 MED ORDER — MORPHINE SULFATE ER 30 MG PO TBCR
30.0000 mg | EXTENDED_RELEASE_TABLET | Freq: Three times a day (TID) | ORAL | 0 refills | Status: DC
  Filled 2021-12-27: qty 90, 30d supply, fill #0

## 2021-12-28 ENCOUNTER — Other Ambulatory Visit (HOSPITAL_COMMUNITY): Payer: Self-pay

## 2021-12-29 ENCOUNTER — Encounter: Payer: Self-pay | Admitting: Family Medicine

## 2021-12-29 ENCOUNTER — Ambulatory Visit: Payer: 59 | Admitting: Family Medicine

## 2021-12-29 VITALS — BP 126/72 | HR 92 | Resp 16 | Ht 65.5 in | Wt 144.0 lb

## 2021-12-29 DIAGNOSIS — J189 Pneumonia, unspecified organism: Secondary | ICD-10-CM | POA: Diagnosis not present

## 2021-12-29 DIAGNOSIS — R9431 Abnormal electrocardiogram [ECG] [EKG]: Secondary | ICD-10-CM | POA: Diagnosis not present

## 2021-12-29 DIAGNOSIS — E876 Hypokalemia: Secondary | ICD-10-CM | POA: Diagnosis not present

## 2021-12-29 NOTE — Progress Notes (Signed)
Established Patient Office Visit  Subjective   Patient ID: [name], female    DOB: [birth year]  Age: 51 y.o. MRN: [id number]  Chief Complaint  Patient presents with   Hospital Follow up    Pneumonia. Patient stated she is still not 100 percent. Patient stated she still has a cough, fatigue and shortness of breath     HPI Here today for hospital follow-up from recent hospitalization at [facility].  She was admitted on May 25 for pneumonia and discharged home 3 days later on May 28.  She actually failed outpatient treatment doxycycline.  She was started on IV antibiotics and then discharged home on Levaquin.  Her potassium was extremely low on admission.  It was 2.0.  She had barely eaten or had anything to drink for most 4 days at that point and had been running really high fevers around 104.  She was negative for Legionella.  Respiratory culture was also negative.  Stated she is still not 100 percent but mostly does feel better.  Patient stated she still has a cough, fatigue and shortness of breath.  She will finish up her steroid taper tomorrow.  She did complete her antibiotics already.  She still using her albuterol every 4 hours as an expectorant to help move out the mucus.  She is mostly now just getting white clearish sputum.    ROS    Objective:     BP 126/72   Pulse 92   Resp 16   Ht 5' 5.5" (1.664 m)   Wt 144 lb (65.3 kg)   SpO2 100%   BMI 23.60 kg/m    Physical Exam Vitals and nursing note reviewed.  Constitutional:      Appearance: She is well-developed.  HENT:     Head: Normocephalic and atraumatic.  Cardiovascular:     Rate and Rhythm: Normal rate and regular rhythm.     Heart sounds: Normal heart sounds.  Pulmonary:     Effort: Pulmonary effort is normal.     Comments: Expiratory wheezing at the left lung base nad right upper lobe. Improved after deep breathing Skin:    General: Skin is warm and dry.  Neurological:     Mental Status: She is  alert and oriented to person, place, and time.  Psychiatric:        Behavior: Behavior normal.     No results found for any visits on 12/29/21.    The ASCVD Risk score (Arnett DK, et al., 2019) failed to calculate for the following reasons:   Cannot find a previous HDL lab   Cannot find a previous total cholesterol lab    Assessment & Plan:   Problem List Items Addressed This Visit       Other   Hypokalemia   Relevant Orders   BASIC METABOLIC PANEL WITH GFR   Other Visit Diagnoses     Abnormal EKG    -  Primary   Pneumonia of both lower lobes due to infectious organism       Relevant Orders   DG Chest 2 View      Bilateral pneumonia-chest x-ray in the hospital showed progression of a right lower lobe pneumonia as well as possible early mild left lower lobe.  She is completed her antibiotics.  She actually has good air movement today with just a little bit of mucus.  She finishes up her prednisone tomorrow.  Plan to get a repeat chest x-ray in about 2 weeks.  Continue as needed use of albuterol.  Call if any new or recurring symptoms or if not continuing to improve.  Work on deep breathing exercises.  Hypokalemia-plan to recheck potassium level today.  We will call with results once available.  Abnormal EKG-she was told that she had an abnormal EKG in the hospital.  Showed some borderline ST depression.  Repeat EKG today shows rate of 84 bpm, normal sinus rhythm with no acute changes and the ST depression has resolved.   No follow-ups on file.    Beatrice Lecher, MD

## 2021-12-29 NOTE — Patient Instructions (Signed)
Just keep working on doing deep breathing exercises during the day to really open up the bottom of your lungs. Would like to get a repeat chest x-ray in about 2 weeks to make sure that you have completely cleared the infection.

## 2021-12-31 ENCOUNTER — Other Ambulatory Visit: Payer: Self-pay | Admitting: Family Medicine

## 2021-12-31 ENCOUNTER — Other Ambulatory Visit (HOSPITAL_COMMUNITY): Payer: Self-pay

## 2021-12-31 ENCOUNTER — Encounter: Payer: Self-pay | Admitting: Family Medicine

## 2021-12-31 DIAGNOSIS — E876 Hypokalemia: Secondary | ICD-10-CM

## 2021-12-31 LAB — BASIC METABOLIC PANEL WITH GFR
BUN: 15 mg/dL (ref 7–25)
CO2: 26 mmol/L (ref 20–32)
Calcium: 9.9 mg/dL (ref 8.6–10.4)
Chloride: 99 mmol/L (ref 98–110)
Creat: 0.72 mg/dL (ref 0.50–1.03)
Glucose, Bld: 77 mg/dL (ref 65–99)
Potassium: 6 mmol/L — ABNORMAL HIGH (ref 3.5–5.3)
Sodium: 138 mmol/L (ref 135–146)
eGFR: 101 mL/min/{1.73_m2} (ref >=60)

## 2021-12-31 LAB — SPECIMEN COMPROMISED

## 2021-12-31 MED ORDER — QUETIAPINE FUMARATE 50 MG PO TABS
100.0000 mg | ORAL_TABLET | Freq: Every day | ORAL | 1 refills | Status: DC
Start: 2021-12-31 — End: 2022-02-01
  Filled 2021-12-31: qty 180, 90d supply, fill #0

## 2021-12-31 NOTE — Progress Notes (Signed)
Hi [name], your potassium was up a little. Maybe from hemolysis of the sample. But everything else looked good.  Can we repeat the potassium on Monday.  Is there a lab closer to you that we can fax the order?

## 2022-01-03 NOTE — Addendum Note (Signed)
Addended by: Narda Rutherford on: 01/03/2022 11:22 AM   Modules accepted: Orders

## 2022-01-14 ENCOUNTER — Ambulatory Visit (INDEPENDENT_AMBULATORY_CARE_PROVIDER_SITE_OTHER): Payer: 59

## 2022-01-14 DIAGNOSIS — J189 Pneumonia, unspecified organism: Secondary | ICD-10-CM

## 2022-01-17 ENCOUNTER — Other Ambulatory Visit (HOSPITAL_COMMUNITY): Payer: Self-pay

## 2022-01-17 ENCOUNTER — Encounter: Payer: Self-pay | Admitting: Family Medicine

## 2022-01-17 DIAGNOSIS — G90522 Complex regional pain syndrome I of left lower limb: Secondary | ICD-10-CM | POA: Diagnosis not present

## 2022-01-17 DIAGNOSIS — E876 Hypokalemia: Secondary | ICD-10-CM | POA: Diagnosis not present

## 2022-01-17 MED ORDER — MORPHINE SULFATE 15 MG PO TABS
15.0000 mg | ORAL_TABLET | Freq: Two times a day (BID) | ORAL | 0 refills | Status: DC | PRN
  Filled 2022-03-25: qty 60, 30d supply, fill #0

## 2022-01-17 MED ORDER — MORPHINE SULFATE ER 30 MG PO TBCR: 30.0000 mg | EXTENDED_RELEASE_TABLET | Freq: Three times a day (TID) | ORAL | 0 refills | Status: DC

## 2022-01-17 MED ORDER — MORPHINE SULFATE ER 30 MG PO TBCR
30.0000 mg | EXTENDED_RELEASE_TABLET | Freq: Three times a day (TID) | ORAL | 0 refills | Status: DC
  Filled 2022-01-26: qty 90, 30d supply, fill #0

## 2022-01-17 MED ORDER — MORPHINE SULFATE 15 MG PO TABS
15.0000 mg | ORAL_TABLET | Freq: Two times a day (BID) | ORAL | 0 refills | Status: DC | PRN
  Filled 2022-01-17: qty 60, 30d supply, fill #0

## 2022-01-17 MED ORDER — MORPHINE SULFATE 15 MG PO TABS
15.0000 mg | ORAL_TABLET | Freq: Two times a day (BID) | ORAL | 0 refills | Status: DC | PRN
  Filled 2022-05-02: qty 60, 30d supply, fill #0

## 2022-01-18 LAB — COMPLETE METABOLIC PANEL WITH GFR
AG Ratio: 2.1 (calc) (ref 1.0–2.5)
ALT: 14 U/L (ref 6–29)
AST: 15 U/L (ref 10–35)
Albumin: 4.4 g/dL (ref 3.6–5.1)
Alkaline phosphatase (APISO): 93 U/L (ref 37–153)
BUN: 13 mg/dL (ref 7–25)
CO2: 28 mmol/L (ref 20–32)
Calcium: 9.2 mg/dL (ref 8.6–10.4)
Chloride: 104 mmol/L (ref 98–110)
Creat: 0.66 mg/dL (ref 0.50–1.03)
Globulin: 2.1 g/dL (calc) (ref 1.9–3.7)
Glucose, Bld: 101 mg/dL (ref 65–139)
Potassium: 4.8 mmol/L (ref 3.5–5.3)
Sodium: 139 mmol/L (ref 135–146)
Total Bilirubin: 0.3 mg/dL (ref 0.2–1.2)
Total Protein: 6.5 g/dL (ref 6.1–8.1)
eGFR: 105 mL/min/{1.73_m2} (ref >=60)

## 2022-01-24 ENCOUNTER — Other Ambulatory Visit (HOSPITAL_COMMUNITY): Payer: Self-pay

## 2022-01-24 ENCOUNTER — Other Ambulatory Visit: Payer: Self-pay | Admitting: Family Medicine

## 2022-01-26 ENCOUNTER — Other Ambulatory Visit (HOSPITAL_COMMUNITY): Payer: Self-pay

## 2022-01-26 ENCOUNTER — Other Ambulatory Visit: Payer: Self-pay | Admitting: Family Medicine

## 2022-01-26 MED ORDER — OMEPRAZOLE 40 MG PO CPDR
40.0000 mg | DELAYED_RELEASE_CAPSULE | Freq: Every day | ORAL | 0 refills | Status: DC
  Filled 2022-01-26: qty 90, 90d supply, fill #0

## 2022-01-31 ENCOUNTER — Other Ambulatory Visit (HOSPITAL_COMMUNITY): Payer: Self-pay

## 2022-01-31 ENCOUNTER — Other Ambulatory Visit: Payer: Self-pay | Admitting: Family Medicine

## 2022-01-31 ENCOUNTER — Encounter: Payer: Self-pay | Admitting: Family Medicine

## 2022-01-31 MED ORDER — FETZIMA 40 MG PO CP24
1.0000 | ORAL_CAPSULE | Freq: Every day | ORAL | 1 refills | Status: DC
  Filled 2022-01-31: qty 90, 90d supply, fill #0
  Filled 2022-05-02: qty 90, 90d supply, fill #1

## 2022-02-01 ENCOUNTER — Other Ambulatory Visit (HOSPITAL_COMMUNITY): Payer: Self-pay

## 2022-02-01 MED ORDER — QUETIAPINE FUMARATE 50 MG PO TABS
150.0000 mg | ORAL_TABLET | Freq: Every day | ORAL | 1 refills | Status: DC
  Filled 2022-02-01 – 2022-02-24 (×2): qty 270, 90d supply, fill #0
  Filled 2022-05-26: qty 270, 90d supply, fill #1

## 2022-02-03 ENCOUNTER — Encounter: Payer: Self-pay | Admitting: Family Medicine

## 2022-02-03 ENCOUNTER — Telehealth (INDEPENDENT_AMBULATORY_CARE_PROVIDER_SITE_OTHER): Payer: 59 | Admitting: Family Medicine

## 2022-02-03 VITALS — Temp 97.8°F | Wt 152.0 lb

## 2022-02-03 DIAGNOSIS — J22 Unspecified acute lower respiratory infection: Secondary | ICD-10-CM | POA: Diagnosis not present

## 2022-02-03 MED ORDER — DOXYCYCLINE HYCLATE 100 MG PO TABS: 100.0000 mg | ORAL_TABLET | Freq: Two times a day (BID) | ORAL | 0 refills | Status: DC

## 2022-02-03 NOTE — Progress Notes (Signed)
Pt has been continuously coughing and now its green. She hasn't been taking anything for this. She stated that the cough is all day and sporadic. She also has a runny nose that has clear drainage. Denies any f/s/c. She has been tired.   She states that her mid back bilaterally is painful more on the left than the right.

## 2022-02-03 NOTE — Progress Notes (Signed)
    Virtual Visit via Video Note  I connected with [name] on 02/03/22 at 11:30 AM EDT by a video enabled telemedicine application and verified that I am speaking with the correct person using two identifiers.   I discussed the limitations of evaluation and management by telemedicine and the availability of in person appointments. The patient expressed understanding and agreed to proceed.  Patient location: at home Provider location: in office  Subjective:    CC:  No chief complaint on file.   HPI:  Pt has been continuously coughing and now its green. She hasn't been taking anything for this. She stated that the cough is all day and sporadic. She also has a runny nose that has clear drainage. Denies any f/s/c. She has been tired.    She states that her mid back bilaterally is painful more on the left than the right.  She had pneumonia back in May and did have a follow-up chest x-ray on June 23 which was negative for any residual infection.  The cough and the fatigue never completely went away but the sputum was clear so she was not too worried about it until it started turning green again this week.  Past medical history, Surgical history, Family history not pertinant except as noted below, Social history, Allergies, and medications have been entered into the medical record, reviewed, and corrections made.    Objective:    General: Speaking clearly in complete sentences without any shortness of breath.  Alert and oriented x3.  Normal judgment. No apparent acute distress.    Impression and Recommendations:    Problem List Items Addressed This Visit   None Visit Diagnoses     Lower respiratory infection    -  Primary      Respiratory tract infection-discussed options.  She is very worried about the possibility of going to pneumonia again especially quickly.  She has not had any significant shortness of breath or wheezing we will go ahead and treat with doxycycline  we will hold off on any additional prednisone.  Call if not improving, getting suddenly worse or developing new symptoms.  Continue with symptomatic care.  No orders of the defined types were placed in this encounter.   Meds ordered this encounter  Medications   doxycycline (VIBRA-TABS) 100 MG tablet    Sig: Take 1 tablet (100 mg total) by mouth 2 (two) times daily.    Dispense:  14 tablet    Refill:  0     I discussed the assessment and treatment plan with the patient. The patient was provided an opportunity to ask questions and all were answered. The patient agreed with the plan and demonstrated an understanding of the instructions.   The patient was advised to call back or seek an in-person evaluation if the symptoms worsen or if the condition fails to improve as anticipated.   Beatrice Lecher, MD

## 2022-02-24 ENCOUNTER — Other Ambulatory Visit (HOSPITAL_COMMUNITY): Payer: Self-pay

## 2022-02-24 MED ORDER — MORPHINE SULFATE ER 30 MG PO TBCR
30.0000 mg | EXTENDED_RELEASE_TABLET | Freq: Three times a day (TID) | ORAL | 0 refills | Status: DC
  Filled 2022-02-24: qty 90, 30d supply, fill #0

## 2022-03-25 ENCOUNTER — Other Ambulatory Visit (HOSPITAL_COMMUNITY): Payer: Self-pay

## 2022-03-25 MED ORDER — MORPHINE SULFATE ER 30 MG PO TBCR
30.0000 mg | EXTENDED_RELEASE_TABLET | Freq: Three times a day (TID) | ORAL | 0 refills | Status: DC
  Filled 2022-03-25: qty 90, 30d supply, fill #0

## 2022-03-25 MED ORDER — MORPHINE SULFATE ER 30 MG PO TBCR
30.0000 mg | EXTENDED_RELEASE_TABLET | Freq: Three times a day (TID) | ORAL | 0 refills | Status: DC
  Filled 2022-03-25: qty 21, 7d supply, fill #0

## 2022-03-25 MED ORDER — NALOXONE HCL 4 MG/0.1ML NA LIQD
1.0000 | Freq: Once | NASAL | 0 refills | Status: AC
  Filled 2022-03-25: qty 2, 30d supply, fill #0

## 2022-03-29 ENCOUNTER — Other Ambulatory Visit (HOSPITAL_COMMUNITY): Payer: Self-pay

## 2022-04-05 ENCOUNTER — Other Ambulatory Visit (HOSPITAL_COMMUNITY): Payer: Self-pay

## 2022-04-05 DIAGNOSIS — Z5181 Encounter for therapeutic drug level monitoring: Secondary | ICD-10-CM | POA: Diagnosis not present

## 2022-04-05 DIAGNOSIS — Z4542 Encounter for adjustment and management of neuropacemaker (brain) (peripheral nerve) (spinal cord): Secondary | ICD-10-CM | POA: Diagnosis not present

## 2022-04-05 DIAGNOSIS — G90522 Complex regional pain syndrome I of left lower limb: Secondary | ICD-10-CM | POA: Diagnosis not present

## 2022-04-05 DIAGNOSIS — Z79899 Other long term (current) drug therapy: Secondary | ICD-10-CM | POA: Diagnosis not present

## 2022-04-05 MED ORDER — MORPHINE SULFATE 15 MG PO TABS: 15.0000 mg | ORAL_TABLET | Freq: Two times a day (BID) | ORAL | 0 refills | Status: DC

## 2022-04-05 MED ORDER — MORPHINE SULFATE ER 30 MG PO TBCR
30.0000 mg | EXTENDED_RELEASE_TABLET | Freq: Three times a day (TID) | ORAL | 0 refills | Status: DC
  Filled 2022-05-05: qty 90, 30d supply, fill #0

## 2022-04-05 MED ORDER — MORPHINE SULFATE 15 MG PO TABS
15.0000 mg | ORAL_TABLET | Freq: Two times a day (BID) | ORAL | 0 refills | Status: DC | PRN
  Filled 2022-06-03: qty 60, 30d supply, fill #0

## 2022-04-05 MED ORDER — MORPHINE SULFATE ER 30 MG PO TBCR
30.0000 mg | EXTENDED_RELEASE_TABLET | Freq: Three times a day (TID) | ORAL | 0 refills | Status: DC
  Filled 2022-04-05: qty 90, 30d supply, fill #0

## 2022-04-15 DIAGNOSIS — G43109 Migraine with aura, not intractable, without status migrainosus: Secondary | ICD-10-CM | POA: Diagnosis not present

## 2022-04-15 DIAGNOSIS — M9901 Segmental and somatic dysfunction of cervical region: Secondary | ICD-10-CM | POA: Diagnosis not present

## 2022-04-15 DIAGNOSIS — M9902 Segmental and somatic dysfunction of thoracic region: Secondary | ICD-10-CM | POA: Diagnosis not present

## 2022-04-15 DIAGNOSIS — M542 Cervicalgia: Secondary | ICD-10-CM | POA: Diagnosis not present

## 2022-04-15 DIAGNOSIS — M546 Pain in thoracic spine: Secondary | ICD-10-CM | POA: Diagnosis not present

## 2022-04-20 DIAGNOSIS — G43109 Migraine with aura, not intractable, without status migrainosus: Secondary | ICD-10-CM | POA: Diagnosis not present

## 2022-04-20 DIAGNOSIS — M546 Pain in thoracic spine: Secondary | ICD-10-CM | POA: Diagnosis not present

## 2022-04-20 DIAGNOSIS — M9902 Segmental and somatic dysfunction of thoracic region: Secondary | ICD-10-CM | POA: Diagnosis not present

## 2022-04-20 DIAGNOSIS — M542 Cervicalgia: Secondary | ICD-10-CM | POA: Diagnosis not present

## 2022-04-20 DIAGNOSIS — M9901 Segmental and somatic dysfunction of cervical region: Secondary | ICD-10-CM | POA: Diagnosis not present

## 2022-04-25 DIAGNOSIS — M542 Cervicalgia: Secondary | ICD-10-CM | POA: Diagnosis not present

## 2022-04-25 DIAGNOSIS — M9902 Segmental and somatic dysfunction of thoracic region: Secondary | ICD-10-CM | POA: Diagnosis not present

## 2022-04-25 DIAGNOSIS — M546 Pain in thoracic spine: Secondary | ICD-10-CM | POA: Diagnosis not present

## 2022-04-25 DIAGNOSIS — M9901 Segmental and somatic dysfunction of cervical region: Secondary | ICD-10-CM | POA: Diagnosis not present

## 2022-04-25 DIAGNOSIS — G43109 Migraine with aura, not intractable, without status migrainosus: Secondary | ICD-10-CM | POA: Diagnosis not present

## 2022-05-02 ENCOUNTER — Other Ambulatory Visit: Payer: Self-pay | Admitting: Family Medicine

## 2022-05-02 ENCOUNTER — Other Ambulatory Visit (HOSPITAL_COMMUNITY): Payer: Self-pay

## 2022-05-02 MED ORDER — PRIMIDONE 50 MG PO TABS
100.0000 mg | ORAL_TABLET | Freq: Every day | ORAL | 1 refills | Status: DC
  Filled 2022-05-02: qty 180, 90d supply, fill #0

## 2022-05-02 MED ORDER — OMEPRAZOLE 40 MG PO CPDR
40.0000 mg | DELAYED_RELEASE_CAPSULE | Freq: Every day | ORAL | 0 refills | Status: DC
  Filled 2022-05-02: qty 90, 90d supply, fill #0

## 2022-05-04 ENCOUNTER — Other Ambulatory Visit: Payer: Self-pay

## 2022-05-04 ENCOUNTER — Other Ambulatory Visit (HOSPITAL_COMMUNITY): Payer: Self-pay

## 2022-05-04 MED ORDER — PROCHLORPERAZINE MALEATE 5 MG PO TABS
5.0000 mg | ORAL_TABLET | Freq: Three times a day (TID) | ORAL | 0 refills | Status: DC
  Filled 2022-05-04: qty 270, 90d supply, fill #0

## 2022-05-04 NOTE — Telephone Encounter (Signed)
Compazine was taken off her medication list by the hospital. [name] states she needs this medication. Pended prescription.      Discontinued by: Deatra James, MD on 12/19/2021 11:15  Reason: Stop Taking at Discharge

## 2022-05-05 ENCOUNTER — Other Ambulatory Visit (HOSPITAL_COMMUNITY): Payer: Self-pay

## 2022-05-17 ENCOUNTER — Encounter (HOSPITAL_COMMUNITY): Payer: Self-pay | Admitting: Neurosurgery

## 2022-05-26 ENCOUNTER — Other Ambulatory Visit (HOSPITAL_COMMUNITY): Payer: Self-pay

## 2022-05-26 DIAGNOSIS — G90522 Complex regional pain syndrome I of left lower limb: Secondary | ICD-10-CM | POA: Diagnosis not present

## 2022-05-26 DIAGNOSIS — M792 Neuralgia and neuritis, unspecified: Secondary | ICD-10-CM | POA: Diagnosis not present

## 2022-05-26 DIAGNOSIS — M21612 Bunion of left foot: Secondary | ICD-10-CM | POA: Diagnosis not present

## 2022-05-26 DIAGNOSIS — G894 Chronic pain syndrome: Secondary | ICD-10-CM | POA: Diagnosis not present

## 2022-05-26 MED ORDER — MORPHINE SULFATE 15 MG PO TABS: 15.0000 mg | ORAL_TABLET | Freq: Two times a day (BID) | ORAL | 0 refills | Status: DC | PRN

## 2022-05-26 MED ORDER — MORPHINE SULFATE 15 MG PO TABS: 15.0000 mg | ORAL_TABLET | Freq: Two times a day (BID) | ORAL | 0 refills | Status: AC | PRN

## 2022-05-26 MED ORDER — MORPHINE SULFATE ER 30 MG PO TBCR
30.0000 mg | EXTENDED_RELEASE_TABLET | Freq: Three times a day (TID) | ORAL | 0 refills | Status: DC
  Filled 2022-06-06: qty 90, 30d supply, fill #0

## 2022-05-26 MED ORDER — MORPHINE SULFATE ER 30 MG PO TBCR: 30.0000 mg | EXTENDED_RELEASE_TABLET | Freq: Three times a day (TID) | ORAL | 0 refills | Status: AC

## 2022-05-26 MED ORDER — MORPHINE SULFATE ER 30 MG PO TBCR: 30.0000 mg | EXTENDED_RELEASE_TABLET | Freq: Three times a day (TID) | ORAL | 0 refills | Status: DC

## 2022-05-27 ENCOUNTER — Other Ambulatory Visit (HOSPITAL_COMMUNITY): Payer: Self-pay

## 2022-06-03 ENCOUNTER — Other Ambulatory Visit (HOSPITAL_COMMUNITY): Payer: Self-pay

## 2022-06-06 ENCOUNTER — Other Ambulatory Visit (HOSPITAL_COMMUNITY): Payer: Self-pay

## 2022-06-09 ENCOUNTER — Ambulatory Visit: Payer: 59 | Admitting: Family Medicine

## 2022-07-12 ENCOUNTER — Encounter: Payer: Self-pay | Admitting: Family Medicine

## 2022-07-12 DIAGNOSIS — F988 Other specified behavioral and emotional disorders with onset usually occurring in childhood and adolescence: Secondary | ICD-10-CM

## 2022-07-12 MED ORDER — AMPHETAMINE-DEXTROAMPHETAMINE 20 MG PO TABS: 20.0000 mg | ORAL_TABLET | Freq: Two times a day (BID) | ORAL | 0 refills | Status: DC

## 2022-07-12 NOTE — Telephone Encounter (Signed)
Meds ordered this encounter  Medications   amphetamine-dextroamphetamine (ADDERALL) 20 MG tablet    Sig: Take 1 tablet (20 mg total) by mouth 2 (two) times daily.    Dispense:  180 tablet    Refill:  0

## 2022-07-27 ENCOUNTER — Encounter: Payer: Self-pay | Admitting: Family Medicine

## 2022-07-28 MED ORDER — PRIMIDONE 50 MG PO TABS: 100.0000 mg | ORAL_TABLET | Freq: Every day | ORAL | 1 refills | Status: DC

## 2022-07-28 MED ORDER — OMEPRAZOLE 40 MG PO CPDR: 40.0000 mg | DELAYED_RELEASE_CAPSULE | Freq: Every day | ORAL | 1 refills | Status: DC

## 2022-08-04 ENCOUNTER — Encounter: Payer: Self-pay | Admitting: Family Medicine

## 2022-08-05 ENCOUNTER — Telehealth: Payer: Self-pay

## 2022-08-05 MED ORDER — FETZIMA 40 MG PO CP24: 1.0000 | ORAL_CAPSULE | Freq: Every day | ORAL | 0 refills | Status: DC

## 2022-08-05 MED ORDER — VENLAFAXINE HCL ER 75 MG PO CP24: 75.0000 mg | ORAL_CAPSULE | Freq: Every day | ORAL | 2 refills | Status: DC

## 2022-08-05 NOTE — Telephone Encounter (Signed)
[name] called and states the [place] cost too much. She is without insurance for the next month. She wanted to know if she could switch to Effexor for a month.

## 2022-08-05 NOTE — Telephone Encounter (Signed)
Left message advising of recommendations.  

## 2022-08-05 NOTE — Telephone Encounter (Signed)
Sent in prescription for 75 mg Effexor.  It should be very close and dose to the 40 mg on the [facility] so she should just be able to switch from 1 day to the next.  She still can feel little wonky probably for about a week.  It is not a smooth transition completely but I hate to take her all the way down and then work all the way up.  It is pretty safe to switch.  But just wanted to give her a heads up.

## 2022-08-16 ENCOUNTER — Encounter: Payer: Self-pay | Admitting: Family Medicine

## 2022-08-16 MED ORDER — QUETIAPINE FUMARATE 50 MG PO TABS: 150.0000 mg | ORAL_TABLET | Freq: Every day | ORAL | 1 refills | Status: DC

## 2022-09-06 ENCOUNTER — Ambulatory Visit: Payer: No Typology Code available for payment source | Admitting: Family Medicine

## 2022-09-06 ENCOUNTER — Encounter: Payer: Self-pay | Admitting: Family Medicine

## 2022-09-06 VITALS — BP 153/84 | HR 74 | Wt 156.1 lb

## 2022-09-06 DIAGNOSIS — R142 Eructation: Secondary | ICD-10-CM

## 2022-09-06 DIAGNOSIS — F988 Other specified behavioral and emotional disorders with onset usually occurring in childhood and adolescence: Secondary | ICD-10-CM

## 2022-09-06 DIAGNOSIS — F339 Major depressive disorder, recurrent, unspecified: Secondary | ICD-10-CM

## 2022-09-06 DIAGNOSIS — R1011 Right upper quadrant pain: Secondary | ICD-10-CM | POA: Diagnosis not present

## 2022-09-06 DIAGNOSIS — R1310 Dysphagia, unspecified: Secondary | ICD-10-CM

## 2022-09-06 MED ORDER — FETZIMA 40 MG PO CP24: 40.0000 mg | ORAL_CAPSULE | Freq: Every day | ORAL | 1 refills | Status: DC

## 2022-09-06 MED ORDER — FETZIMA 20 MG PO CP24: 20.0000 mg | ORAL_CAPSULE | Freq: Every day | ORAL | 0 refills | Status: DC

## 2022-09-06 NOTE — Assessment & Plan Note (Signed)
Switch back to [facility] off of Effexor.

## 2022-09-06 NOTE — Progress Notes (Signed)
Established Patient Office Visit  Subjective   Patient ID: [name], female    DOB: November 13, 1970  Age: 52 y.o. MRN: [id number]  Chief Complaint  Patient presents with   Hernia   Medication Refill    HPI  ADD - Reports symptoms are well controlled on current regime. Denies any problems with insomnia, chest pain, palpitations, or SOB.  Still has some tabs of does not need a refill today.  Dealing with some chronic constipation she has been taking a softener daily and that has helped some she leaves has 1 bowel movement weekly.  And she also recently started a magnesium supplement.  Is also been having some right upper quadrant pain right over the rib area just below the breast.  She says at times it feels like a stabbing twisting pain especially if she flexes at the waist or if she rotates at the waist.  She is also noticed some occasional swelling in that area that comes and goes.  She is also been experiencing more difficulty swallowing for the last couple weeks so she has been eating a lot of ice cream and she has had a lot more belching than usual.  Struggling with insomnia but it has been worse since she has been on the Effexor she is waking up every 1 to 1-1/2 hours.  Also taking quetiapine.  Follow-up depression-she would like to switch back to the [facility].  She has always done much better with this particular medication but we had to switch because she was without insurance temporarily.  Now with her new job her insurance is enrolled again.  She is currently on Effexor 75 mg.    ROS    Objective:     BP (!) 153/84 (BP Location: Left Arm, Patient Position: Sitting, Cuff Size: Small)   Pulse 74   Wt 156 lb 1.3 oz (70.8 kg)   SpO2 97%   BMI 25.58 kg/m    Physical Exam Vitals and nursing note reviewed.  Constitutional:      Appearance: She is well-developed.  HENT:     Head: Normocephalic and atraumatic.  Cardiovascular:     Rate and Rhythm: Normal rate and  regular rhythm.     Heart sounds: Normal heart sounds.  Pulmonary:     Effort: Pulmonary effort is normal.     Breath sounds: Normal breath sounds.  Skin:    General: Skin is warm and dry.  Neurological:     Mental Status: She is alert and oriented to person, place, and time.  Psychiatric:        Behavior: Behavior normal.      No results found for any visits on 09/06/22.    The ASCVD Risk score (Arnett DK, et al., 2019) failed to calculate for the following reasons:   Cannot find a previous HDL lab   Cannot find a previous total cholesterol lab    Assessment & Plan:   Problem List Items Addressed This Visit       Other   Depression, recurrent ([place])    Switch back to [place] off of Effexor.      Relevant Medications   Levomilnacipran HCl ER (FETZIMA) 40 MG CP24   Levomilnacipran HCl ER (FETZIMA) 20 MG CP24   ADD (attention deficit disorder) - Primary    Well on current regimen she really only uses it on workdays.      Other Visit Diagnoses     RUQ pain  Relevant Orders   CT Abdomen Pelvis W Contrast   Belching       Relevant Orders   Ambulatory referral to Gastroenterology   CT Abdomen Pelvis W Contrast   Dysphagia, unspecified type       Relevant Orders   Ambulatory referral to Gastroenterology   CT Abdomen Pelvis W Contrast      Dysphagia-refer back to GI.  It could be that she has an esophageal stricture or worsening hiatal hernia certainly possible.  She already is on omeprazole 40 mg.  Right upper quadrant pain/belching-she does still have a gallbladder.  Will start with CAT scan for further evaluation she also has history of chronic constipation which could be contributing. Sure to increase her stool softener to twice a day.   No follow-ups on file.    Beatrice Lecher, MD

## 2022-09-06 NOTE — Assessment & Plan Note (Signed)
Well on current regimen she really only uses it on workdays.

## 2022-09-07 ENCOUNTER — Telehealth: Payer: Self-pay | Admitting: Family Medicine

## 2022-09-07 DIAGNOSIS — R142 Eructation: Secondary | ICD-10-CM

## 2022-09-07 DIAGNOSIS — R1310 Dysphagia, unspecified: Secondary | ICD-10-CM

## 2022-09-07 DIAGNOSIS — E876 Hypokalemia: Secondary | ICD-10-CM

## 2022-09-07 DIAGNOSIS — R1011 Right upper quadrant pain: Secondary | ICD-10-CM

## 2022-09-07 NOTE — Telephone Encounter (Signed)
Orders Placed This Encounter  Procedures   Lipid Panel w/reflex Direct LDL   BASIC METABOLIC PANEL WITH GFR   CBC with Differential/Platelet

## 2022-09-07 NOTE — Telephone Encounter (Signed)
Patient called stated her insurance would pay if she was sent to [facility] [place] phone number is [phone] and she can use [facility] or [facility]

## 2022-09-07 NOTE — Addendum Note (Signed)
Addended by: Beatrice Lecher D on: 09/07/2022 01:17 PM   Modules accepted: Orders

## 2022-09-08 ENCOUNTER — Encounter: Payer: Self-pay | Admitting: Family Medicine

## 2022-09-08 NOTE — Telephone Encounter (Signed)
CT referral order updated to preferred imaging location. Auth process pending approval.

## 2022-09-09 ENCOUNTER — Telehealth: Payer: Self-pay

## 2022-09-09 NOTE — Telephone Encounter (Signed)
Per Irving Copas, additional information must be sent for auth request. Insurance is requesting if the member has any of the following: U/S, CBC, CMP and/or urinalysis. Please advise, thanks. A MyChart msg was sent to the patient regarding the following update.

## 2022-09-09 NOTE — Telephone Encounter (Signed)
We did order labs so if she wants to try to go ahead and get those done that we will at least include a CBC and a CMP.  But if they are requiring that we get an ultrasound first we can do so.

## 2022-09-12 NOTE — Telephone Encounter (Signed)
Mychart message sent to patient in regards to completing the lab order. Patient is aware of the most recent imaging study status. [name] is still under a medical review.

## 2022-09-15 ENCOUNTER — Emergency Department (HOSPITAL_COMMUNITY): Payer: PRIVATE HEALTH INSURANCE

## 2022-09-15 ENCOUNTER — Emergency Department (HOSPITAL_COMMUNITY)
Admission: EM | Admit: 2022-09-15 | Discharge: 2022-09-15 | Disposition: A | Discharge status: Home / Self Care | Payer: PRIVATE HEALTH INSURANCE | Attending: Emergency Medicine | Admitting: Emergency Medicine

## 2022-09-15 ENCOUNTER — Other Ambulatory Visit: Payer: Self-pay

## 2022-09-15 ENCOUNTER — Encounter (HOSPITAL_COMMUNITY): Payer: Self-pay | Admitting: Emergency Medicine

## 2022-09-15 DIAGNOSIS — K5900 Constipation, unspecified: Secondary | ICD-10-CM | POA: Diagnosis not present

## 2022-09-15 DIAGNOSIS — R1010 Upper abdominal pain, unspecified: Secondary | ICD-10-CM

## 2022-09-15 DIAGNOSIS — R0981 Nasal congestion: Secondary | ICD-10-CM | POA: Diagnosis not present

## 2022-09-15 DIAGNOSIS — R1084 Generalized abdominal pain: Secondary | ICD-10-CM | POA: Diagnosis present

## 2022-09-15 LAB — COMPREHENSIVE METABOLIC PANEL
ALT: 13 U/L (ref 0–44)
AST: 17 U/L (ref 15–41)
Albumin: 3.7 g/dL (ref 3.5–5.0)
Alkaline Phosphatase: 81 U/L (ref 38–126)
Anion gap: 9 (ref 5–15)
BUN: 12 mg/dL (ref 6–20)
CO2: 27 mmol/L (ref 22–32)
Calcium: 8.4 mg/dL — ABNORMAL LOW (ref 8.9–10.3)
Chloride: 101 mmol/L (ref 98–111)
Creatinine, Ser: 0.7 mg/dL (ref 0.44–1.00)
GFR, Estimated: >60 mL/min (ref >60)
Glucose, Bld: 95 mg/dL (ref 70–99)
Potassium: 3.9 mmol/L (ref 3.5–5.1)
Sodium: 137 mmol/L (ref 135–145)
Total Bilirubin: 0.4 mg/dL (ref 0.3–1.2)
Total Protein: 6.3 g/dL — ABNORMAL LOW (ref 6.5–8.1)

## 2022-09-15 LAB — CBC WITH DIFFERENTIAL/PLATELET
Abs Immature Granulocytes: 0.01 10*3/uL (ref 0.00–0.07)
Basophils Absolute: 0 10*3/uL (ref 0.0–0.1)
Basophils Relative: 1 %
Eosinophils Absolute: 0.1 10*3/uL (ref 0.0–0.5)
Eosinophils Relative: 2 %
HCT: 40.6 % (ref 36.0–46.0)
Hemoglobin: 13.5 g/dL (ref 12.0–15.0)
Immature Granulocytes: 0 %
Lymphocytes Relative: 37 %
Lymphs Abs: 2.3 10*3/uL (ref 0.7–4.0)
MCH: 29.9 pg (ref 26.0–34.0)
MCHC: 33.3 g/dL (ref 30.0–36.0)
MCV: 89.8 fL (ref 80.0–100.0)
Monocytes Absolute: 0.6 10*3/uL (ref 0.1–1.0)
Monocytes Relative: 10 %
Neutro Abs: 3.1 10*3/uL (ref 1.7–7.7)
Neutrophils Relative %: 50 %
Platelets: 306 10*3/uL (ref 150–400)
RBC: 4.52 MIL/uL (ref 3.87–5.11)
RDW: 12.5 % (ref 11.5–15.5)
WBC: 6.2 10*3/uL (ref 4.0–10.5)
nRBC: 0 % (ref 0.0–0.2)

## 2022-09-15 LAB — LIPASE, BLOOD: Lipase: 25 U/L (ref 11–51)

## 2022-09-15 MED ORDER — DICYCLOMINE HCL 20 MG PO TABS: ORAL_TABLET | ORAL | 1 refills | Status: DC

## 2022-09-15 MED ORDER — PANTOPRAZOLE SODIUM 40 MG IV SOLR
40.0000 mg | Freq: Once | INTRAVENOUS | Status: AC
  Administered 2022-09-15: 40 mg via INTRAVENOUS
  Filled 2022-09-15: qty 10

## 2022-09-15 MED ORDER — IOHEXOL 300 MG/ML  SOLN
100.0000 mL | Freq: Once | INTRAMUSCULAR | Status: AC | PRN
  Administered 2022-09-15: 100 mL via INTRAVENOUS

## 2022-09-15 NOTE — ED Triage Notes (Signed)
Pt states she has had abd pain x72month Pt has hx of hiatal hernia and has been referred to GI and to have a outpatient CT scan by her primary Doctor. Pt states she has not heard from either and the pain has gotten worse over the last few days. Unable to tolerate food. States it feels like It gets stuck in her chest then she has a sharp pain where she believes the food is passing through. Pt states she stays constipated.

## 2022-09-15 NOTE — Discharge Instructions (Signed)
Follow-up with your GI doctor as planned.  Return sooner if problems

## 2022-09-15 NOTE — ED Provider Notes (Signed)
[facility] Provider Note   CSN: [id number] Arrival date & time: 09/15/22  0757     History  Chief Complaint  Patient presents with   Abdominal Pain    [name] is a 52 y.o. female.  Patient has a history of irritable bowel, constipation and anemia.  Patient complains of abdominal discomfort  The history is provided by the patient. No language interpreter was used.  Abdominal Pain Pain location:  Generalized Pain quality: aching   Pain radiates to:  Does not radiate Pain severity:  Moderate Onset quality:  Sudden Timing:  Constant Progression:  Worsening Chronicity:  Recurrent Context: not alcohol use   Relieved by:  Nothing Worsened by:  Nothing Associated symptoms: no chest pain, no cough, no diarrhea, no fatigue and no hematuria        Home Medications Prior to Admission medications   Medication Sig Start Date End Date Taking? Authorizing Provider  albuterol (VENTOLIN HFA) 108 (90 Base) MCG/ACT inhaler Inhale 1-2 puffs into the lungs every 6 (six) hours as needed for wheezing or shortness of breath. 12/12/21  Yes Volney American, PA-C  amphetamine-dextroamphetamine (ADDERALL) 20 MG tablet Take 1 tablet (20 mg total) by mouth 2 (two) times daily. 07/12/22  Yes Hali Marry, MD  dicyclomine (BENTYL) 20 MG tablet Take 1 every 8 hours as needed for abdominal discomfort. 09/15/22  Yes Milton Ferguson, MD  Levomilnacipran HCl ER (FETZIMA) 40 MG CP24 Take 40 mg by mouth daily. 09/06/22  Yes Hali Marry, MD  morphine (MS CONTIN) 30 MG 12 hr tablet Take 1 tablet (30 mg total) by mouth 3 (three) times daily for 30 days. Max Daily Amount: 90 mg 08/03/22  Yes   morphine (MSIR) 15 MG tablet Take 1 tablet (15 mg total) by mouth 2 (two) times daily as needed for up to 30 days. 07/31/22  Yes   omeprazole (PRILOSEC) 40 MG capsule Take 1 capsule (40 mg total) by mouth daily. 07/28/22  Yes Hali Marry, MD   primidone (MYSOLINE) 50 MG tablet Take 2 tablets (100 mg total) by mouth at bedtime. 07/28/22  Yes Hali Marry, MD  QUEtiapine (SEROQUEL) 50 MG tablet Take 3 tablets (150 mg total) by mouth at bedtime. 08/16/22  Yes Hali Marry, MD  prochlorperazine (COMPAZINE) 5 MG tablet Take 1 tablet (5 mg total) by mouth 3 (three) times daily. 05/04/22 09/06/22  Hali Marry, MD  RESTASIS 0.05 % ophthalmic emulsion Place 1 drop into both eyes 2 (two) times daily as directed Patient not taking: Reported on 09/15/2022 06/16/21         Allergies    Chantix [varenicline], Gabapentin, Lyrica [pregabalin], Topamax [topiramate], Clavulanic acid, Codeine, Methadone, Other, Ranitidine hcl, Sulfa antibiotics, Hydrocodone-acetaminophen, Penicillins, Shingrix [zoster vac recomb adjuvanted], Tagamet [cimetidine], Tessalon [benzonatate], and Trazodone and nefazodone    Review of Systems   Review of Systems  Constitutional:  Negative for appetite change and fatigue.  HENT:  Negative for congestion, ear discharge and sinus pressure.   Eyes:  Negative for discharge.  Respiratory:  Negative for cough.   Cardiovascular:  Negative for chest pain.  Gastrointestinal:  Positive for abdominal pain. Negative for diarrhea.  Genitourinary:  Negative for frequency and hematuria.  Musculoskeletal:  Negative for back pain.  Skin:  Negative for rash.  Neurological:  Negative for seizures and headaches.  Psychiatric/Behavioral:  Negative for hallucinations.     Physical Exam Updated Vital Signs BP 109/72  Pulse 64   Temp 98.3 F (36.8 C) (Oral)   Resp 15   Ht '5\' 5"'$  (1.651 m)   Wt 70.8 kg   SpO2 97%   BMI 25.96 kg/m  Physical Exam Vitals and nursing note reviewed.  Constitutional:      Appearance: She is well-developed.  HENT:     Head: Normocephalic.     Nose: Congestion present.  Eyes:     General: No scleral icterus.    Conjunctiva/sclera: Conjunctivae normal.  Neck:     Thyroid: No  thyromegaly.  Cardiovascular:     Rate and Rhythm: Normal rate and regular rhythm.     Heart sounds: No murmur heard.    No friction rub. No gallop.  Pulmonary:     Breath sounds: No stridor. No wheezing or rales.  Chest:     Chest wall: No tenderness.  Abdominal:     General: There is no distension.     Tenderness: There is abdominal tenderness. There is no rebound.  Musculoskeletal:        General: Normal range of motion.     Cervical back: Neck supple.  Lymphadenopathy:     Cervical: No cervical adenopathy.  Skin:    Findings: No erythema or rash.  Neurological:     Mental Status: She is alert and oriented to person, place, and time.     Motor: No abnormal muscle tone.     Coordination: Coordination normal.  Psychiatric:        Behavior: Behavior normal.     ED Results / Procedures / Treatments   Labs (all labs ordered are listed, but only abnormal results are displayed) Labs Reviewed  COMPREHENSIVE METABOLIC PANEL - Abnormal; Notable for the following components:      Result Value   Calcium 8.4 (*)    Total Protein 6.3 (*)    All other components within normal limits  CBC WITH DIFFERENTIAL/PLATELET  LIPASE, BLOOD  URINALYSIS, ROUTINE W REFLEX MICROSCOPIC    EKG None  Radiology US Abdomen Complete  Result Date: 09/15/2022 CLINICAL DATA:  Abdominal pain and nausea for 1 month. EXAM: ABDOMEN ULTRASOUND COMPLETE COMPARISON:  CT scan, same date. FINDINGS: Gallbladder: No gallstones or wall thickening visualized. No sonographic Murphy sign noted by sonographer. Common bile duct: Diameter: 6.0 mm Liver: Normal echogenicity without focal lesion or biliary dilatation. Portal vein is patent on color Doppler imaging with normal direction of blood flow towards the liver. IVC: Normal caliber Pancreas: Normal Spleen: Normal Right Kidney: Length: 10.4 cm. Normal renal cortical thickness and echogenicity without focal lesions or hydronephrosis. Left Kidney: Length: 9.2 cm. Normal  renal cortical thickness and echogenicity without focal lesions or hydronephrosis. Abdominal aorta: Normal caliber Other findings: None. IMPRESSION: Unremarkable abdominal ultrasound examination. Electronically Signed   By: Marijo Sanes M.D.   On: 09/15/2022 10:38   CT ABDOMEN PELVIS W CONTRAST  Result Date: 09/15/2022 CLINICAL DATA:  52 year old female with increasing abdominal pain for 1 month. "Hiatal hernia". EXAM: CT ABDOMEN AND PELVIS WITH CONTRAST TECHNIQUE: Multidetector CT imaging of the abdomen and pelvis was performed using the standard protocol following bolus administration of intravenous contrast. RADIATION DOSE REDUCTION: This exam was performed according to the departmental dose-optimization program which includes automated exposure control, adjustment of the mA and/or kV according to patient size and/or use of iterative reconstruction technique. CONTRAST:  13m OMNIPAQUE IOHEXOL 300 MG/ML  SOLN COMPARISON:  CT Abdomen and Pelvis 01/02/2020. FINDINGS: Lower chest: Mild respiratory motion at the lung bases  which appear to remain clear. No pericardial or pleural effusion. And no evidence of gastric hiatal hernia. Hepatobiliary: Negative liver and gallbladder. Pancreas: Negative. Spleen: Negative. Adrenals/Urinary Tract: Normal adrenal glands. Kidneys appears symmetric and normal. Normal renal contrast excretion. No nephrolithiasis. Decompressed ureters and bladder. Incidental pelvic phleboliths. Stomach/Bowel: Abundant retained stool throughout redundant large bowel in the abdomen and pelvis. No large bowel wall thickening. Normal appendix on series 2, image 64. Decompressed terminal ileum, and no dilated small bowel. Decompressed stomach with no evidence of hiatal hernia. However, there is evidence of a small Juxta phrenic gastric diverticulum on series 2, image 18 (14 mm. No associated inflammation. Duodenum appears negative. No free air, free fluid, or mesenteric inflammation identified. No  ventral abdominal hernia identified. Vascular/Lymphatic: Mild Calcified aortic atherosclerosis. Normal caliber abdominal aorta. Major arterial structures in the abdomen and pelvis are patent. Portal venous system is patent. No lymphadenopathy identified. Reproductive: Surgically absent uterus. Diminutive or absent ovaries. Other: No pelvis free fluid. Musculoskeletal: Lower thoracic spinal stimulator, with generator device over the posterior right flank. No acute osseous abnormality identified. IMPRESSION: 1. No inflammatory process identified in the abdomen or pelvis. And negative for gastric hiatal hernia, although there is a small juxta-phrenic gastric diverticulum, but clinical significance is doubtful. 2. Abundant retained stool throughout redundant large bowel. Consider constipation. 3. Thoracic spinal stimulator. Electronically Signed   By: Genevie Ann M.D.   On: 09/15/2022 09:35    Procedures Procedures    Medications Ordered in ED Medications  pantoprazole (PROTONIX) injection 40 mg (40 mg Intravenous Given 09/15/22 1026)  iohexol (OMNIPAQUE) 300 MG/ML solution 100 mL (100 mLs Intravenous Contrast Given 09/15/22 0911)    ED Course/ Medical Decision Making/ A&P                             Medical Decision Making Amount and/or Complexity of Data Reviewed Labs: ordered. Radiology: ordered.  Risk Prescription drug management.  This patient presents to the ED for concern of abdominal pain, this involves an extensive number of treatment options, and is a complaint that carries with it a high risk of complications and morbidity.  The differential diagnosis includes irritable bowel, appendicitis, constipation   Co morbidities that complicate the patient evaluation  Anemia and irritable bowel   Additional history obtained:  Additional history obtained from patient External records from outside source obtained and reviewed including full records   Lab Tests:  I Ordered, and  personally interpreted labs.  The pertinent results include: CBC and chemistries negative   Imaging Studies ordered:  I ordered imaging studies including CT abdomen and ultrasound I independently visualized and interpreted imaging which showed constipation I agree with the radiologist interpretation   Cardiac Monitoring: / EKG:  The patient was maintained on a cardiac monitor.  I personally viewed and interpreted the cardiac monitored which showed an underlying rhythm of: Normal sinus rhythm   Consultations Obtained:  No consultants Problem List / ED Course / Critical interventions / Medication management  Abdominal pain, constipation, irritable bowel I ordered medication including Protonix and normal saline Reevaluation of the patient after these medicines showed that the patient improved I have reviewed the patients home medicines and have made adjustments as needed   Social Determinants of Health:  None   Test / Admission - Considered:  None  Patient with chronic abdominal pain and constipation.  She is given some Bentyl and follow-up with GI in 2 weeks  Final Clinical Impression(s) / ED Diagnoses Final diagnoses:  Constipation, unspecified constipation type  Pain of upper abdomen    Rx / DC Orders ED Discharge Orders          Ordered    dicyclomine (BENTYL) 20 MG tablet        09/15/22 1251              Milton Ferguson, MD 09/16/22 [phone]

## 2022-09-16 ENCOUNTER — Telehealth: Payer: Self-pay | Admitting: General Practice

## 2022-09-16 NOTE — Transitions of Care (Post Inpatient/ED Visit) (Signed)
   09/16/2022  Name: KIMBERY KIETZMAN MRN: [id number] DOB: Jul 16, 1971  Today's TOC FU Call Status: Today's TOC FU Call Status:: Unsuccessul Call (1st Attempt) Unsuccessful Call (1st Attempt) Date: 09/16/22  Attempted to reach the patient regarding the most recent Inpatient/ED visit.  Follow Up Plan: Additional outreach attempts will be made to reach the patient to complete the Transitions of Care (Post Inpatient/ED visit) call.   Signature Tinnie Gens, RN BSN

## 2022-09-19 NOTE — Transitions of Care (Post Inpatient/ED Visit) (Signed)
   09/19/2022  Name: [name] MRN: [id number] DOB: 07-Oct-1970  Today's TOC FU Call Status: Today's TOC FU Call Status:: Unsuccessful Call (2nd Attempt) Unsuccessful Call (1st Attempt) Date: 09/16/22 Unsuccessful Call (2nd Attempt) Date: 09/19/22  Attempted to reach the patient regarding the most recent Inpatient/ED visit.  Follow Up Plan: Additional outreach attempts will be made to reach the patient to complete the Transitions of Care (Post Inpatient/ED visit) call.   Signature Tinnie Gens, RN BSN

## 2022-09-22 ENCOUNTER — Encounter: Payer: Self-pay | Admitting: Radiology

## 2022-09-23 NOTE — Transitions of Care (Post Inpatient/ED Visit) (Signed)
   09/23/2022  Name: [name] MRN: [id number] DOB: 01-Mar-1971  Today's TOC FU Call Status: Today's TOC FU Call Status:: Unsuccessful Call (3rd Attempt) Unsuccessful Call (1st Attempt) Date: 09/16/22 Unsuccessful Call (2nd Attempt) Date: 09/19/22 Unsuccessful Call (3rd Attempt) Date: 09/23/22  Attempted to reach the patient regarding the most recent Inpatient/ED visit.  Follow Up Plan: No further outreach attempts will be made at this time. We have been unable to contact the patient.  Signature Tinnie Gens, RN

## 2022-09-27 ENCOUNTER — Ambulatory Visit: Payer: No Typology Code available for payment source | Admitting: Nurse Practitioner

## 2022-10-04 ENCOUNTER — Other Ambulatory Visit: Payer: Self-pay

## 2022-10-04 ENCOUNTER — Ambulatory Visit
Admission: EM | Admit: 2022-10-04 | Discharge: 2022-10-04 | Disposition: A | Discharge status: Home / Self Care | Payer: No Typology Code available for payment source | Attending: Nurse Practitioner | Admitting: Nurse Practitioner

## 2022-10-04 ENCOUNTER — Encounter: Payer: Self-pay | Admitting: Emergency Medicine

## 2022-10-04 ENCOUNTER — Ambulatory Visit: Payer: No Typology Code available for payment source

## 2022-10-04 DIAGNOSIS — R059 Cough, unspecified: Secondary | ICD-10-CM

## 2022-10-04 DIAGNOSIS — Z1152 Encounter for screening for COVID-19: Secondary | ICD-10-CM | POA: Diagnosis not present

## 2022-10-04 DIAGNOSIS — J069 Acute upper respiratory infection, unspecified: Secondary | ICD-10-CM | POA: Insufficient documentation

## 2022-10-04 MED ORDER — PROMETHAZINE-DM 6.25-15 MG/5ML PO SYRP: 5.0000 mL | ORAL_SOLUTION | Freq: Every evening | ORAL | 0 refills | Status: DC | PRN

## 2022-10-04 NOTE — ED Triage Notes (Addendum)
Pt reports fever, chills, cough, body aches x1 week. Pt reports broke out in a sweat and upper right side back pain since last night.   Home covid test on Wednesday was negative.

## 2022-10-04 NOTE — Discharge Instructions (Signed)
You have a viral upper respiratory infection.  Symptoms should improve over the next week to 10 days.  If you develop chest pain or shortness of breath, go to the emergency room.  We have tested you today for COVID-19.  You will see the results in Mychart and we will call you with positive results.  Please stay home and isolate until you are aware of the results.    Some things that can make you feel better are: - Increased rest - Increasing fluid with water/sugar free electrolytes - Acetaminophen and ibuprofen as needed for fever/pain - Salt water gargling, chloraseptic spray and throat lozenges for sore throat - OTC guaifenesin (Mucinex) 600 mg twice daily for congestion - Saline sinus flushes or a neti pot - Humidifying the air - cough syrup at nighttime as needed for dry cough
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
## 2022-10-05 LAB — SARS CORONAVIRUS 2 (TAT 6-24 HRS): SARS Coronavirus 2: NEGATIVE

## 2022-11-02 ENCOUNTER — Encounter: Payer: Self-pay | Admitting: Gastroenterology

## 2022-11-02 ENCOUNTER — Ambulatory Visit: Payer: No Typology Code available for payment source | Admitting: Gastroenterology

## 2022-11-02 VITALS — BP 116/64 | HR 92 | Ht 65.5 in | Wt 158.5 lb

## 2022-11-02 DIAGNOSIS — Z1211 Encounter for screening for malignant neoplasm of colon: Secondary | ICD-10-CM | POA: Diagnosis not present

## 2022-11-02 DIAGNOSIS — K5909 Other constipation: Secondary | ICD-10-CM

## 2022-11-02 DIAGNOSIS — Z860100 Personal history of colon polyps, unspecified: Secondary | ICD-10-CM | POA: Insufficient documentation

## 2022-11-02 MED ORDER — MOTEGRITY 2 MG PO TABS: 1.0000 | ORAL_TABLET | Freq: Every day | ORAL | 3 refills | Status: DC

## 2022-11-02 MED ORDER — PLENVU 140 G PO SOLR: 1.0000 | ORAL | 0 refills | Status: DC
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
## 2022-11-02 NOTE — Patient Instructions (Addendum)
_______________________________________________________  If your blood pressure at your visit was 140/90 or greater, please contact your primary care physician to follow up on this.  _______________________________________________________  If you are age 54 or older, your body mass index should be between 23-30. Your Body mass index is 25.97 kg/m. If this is out of the aforementioned range listed, please consider follow up with your Primary Care Provider.  If you are age 36 or younger, your body mass index should be between 19-25. Your Body mass index is 25.97 kg/m. If this is out of the aformentioned range listed, please consider follow up with your Primary Care Provider.   ________________________________________________________  The [facility] providers would like to encourage you to use [facility] to communicate with providers for non-urgent requests or questions.  Due to long hold times on the telephone, sending your provider a message by [facility] may be a faster and more efficient way to get a response.  Please allow 48 business hours for a response.  Please remember that this is for non-urgent requests.  _______________________________________________________  Doug Sou, PA-C  recommends that you complete a bowel purge (to clean out your bowels). Please do the following: Purchase a bottle of Miralax over the counter as well as a box of 5 mg dulcolax tablets. Take 4 dulcolax tablets. Wait 1 hour. You will then drink 6-8 capfuls of Miralax mixed in an adequate amount of water/juice/gatorade (you may choose which of these liquids to drink) over the next 2-3 hours. You should expect results within 1 to 6 hours after completing the bowel purge.  We have sent the following medications to your pharmacy for you to pick up at your convenience: Motegrity  You have been scheduled for a colonoscopy. Please follow written instructions given to you at your visit today.  Please pick up your prep  supplies at the pharmacy within the next 1-3 days. If you use inhalers (even only as needed), please bring them with you on the day of your procedure.  We have sent the following medications to your pharmacy for you to pick up at your convenience: Plenvu  Please call with any questions or concerns.  It was a pleasure to see you today!  Thank you for trusting me with your gastrointestinal care!

## 2022-11-03 NOTE — Progress Notes (Signed)
Agree with the assessment and plan as outlined by Jessica Zehr, PA-C. ? ?Kaliyah Gladman, DO, FACG ? ?

## 2022-12-12 ENCOUNTER — Telehealth: Payer: Self-pay | Admitting: Gastroenterology

## 2022-12-12 NOTE — Telephone Encounter (Signed)
Patient called stating at her office visit appointment on 04/10 she was instructed to do bowel prep. States that it made her very sick and she was vomiting. She also states that the medication that was prescribed has also not been helping. States she is worried about her colonoscopy on 05/30. Please advise, thank you.

## 2022-12-13 NOTE — Telephone Encounter (Signed)
The pt states that the bowel purge she completed caused her to have nausea and did not seem to help very much.  She is taking motegrity but also thinks that is not helping very much.  I have advised that she can try mag citrate or fleet enema to try and get things moving and see if that allows the motegrity to work.  She will call if this does not help and she will keep her appt as planned for colon.

## 2022-12-15 ENCOUNTER — Encounter: Payer: Self-pay | Admitting: Family Medicine

## 2022-12-15 DIAGNOSIS — F339 Major depressive disorder, recurrent, unspecified: Secondary | ICD-10-CM

## 2022-12-15 MED ORDER — PROCHLORPERAZINE MALEATE 5 MG PO TABS: 5.0000 mg | ORAL_TABLET | Freq: Three times a day (TID) | ORAL | 0 refills | Status: DC

## 2022-12-15 MED ORDER — FETZIMA 40 MG PO CP24: 40.0000 mg | ORAL_CAPSULE | Freq: Every day | ORAL | 1 refills | Status: DC

## 2022-12-15 MED ORDER — OMEPRAZOLE 40 MG PO CPDR: 40.0000 mg | DELAYED_RELEASE_CAPSULE | Freq: Every day | ORAL | 1 refills | Status: DC

## 2022-12-15 MED ORDER — QUETIAPINE FUMARATE 50 MG PO TABS: 150.0000 mg | ORAL_TABLET | Freq: Every day | ORAL | 1 refills | Status: DC

## 2022-12-22 ENCOUNTER — Ambulatory Visit: Payer: No Typology Code available for payment source | Admitting: Gastroenterology

## 2022-12-22 ENCOUNTER — Encounter: Payer: Self-pay | Admitting: Gastroenterology

## 2022-12-22 VITALS — BP 118/66 | HR 75 | Temp 99.1°F | Resp 12 | Ht 65.0 in | Wt 158.0 lb

## 2022-12-22 DIAGNOSIS — Z1211 Encounter for screening for malignant neoplasm of colon: Secondary | ICD-10-CM

## 2022-12-22 DIAGNOSIS — K5909 Other constipation: Secondary | ICD-10-CM

## 2022-12-22 MED ORDER — SODIUM CHLORIDE 0.9 % IV SOLN: 500.0000 mL | Freq: Once | INTRAVENOUS | Status: DC

## 2022-12-22 NOTE — Op Note (Signed)
[facility] Patient Name: [name] Procedure Date: 12/22/2022 9:35 AM MRN: [id number] Endoscopist: Doristine Locks , MD, [id number] Age: 52 Referring MD:  Date of Birth: Dec 21, 1970 Gender: Female Account #: [id number] Procedure:                Colonoscopy Indications:              Screening for colorectal malignant neoplasm. Last                            Colonoscopy was 02/17/2016 and notable for Internal                            hemorrhoids. Repeat 5 years due to limited prep.                           Separately, she has a history of chronic                            constipation. Medicines:                Monitored Anesthesia Care Procedure:                Pre-Anesthesia Assessment:                           - Prior to the procedure, a History and Physical                            was performed, and patient medications and                            allergies were reviewed. The patient's tolerance of                            previous anesthesia was also reviewed. The risks                            and benefits of the procedure and the sedation                            options and risks were discussed with the patient.                            All questions were answered, and informed consent                            was obtained. Prior Anticoagulants: The patient has                            taken no anticoagulant or antiplatelet agents. ASA                            Grade Assessment: II - A patient with mild systemic  disease. After reviewing the risks and benefits,                            the patient was deemed in satisfactory condition to                            undergo the procedure.                           After obtaining informed consent, the colonoscope                            was passed under direct vision. Throughout the                            procedure, the patient's blood pressure, pulse, and                             oxygen saturations were monitored continuously. The                            PCF-HQ190L Colonoscope [id number] was introduced                            through the anus and advanced to the the cecum,                            identified by appendiceal orifice and ileocecal                            valve. The colonoscopy was technically difficult                            and complex due to poor bowel prep. The patient                            tolerated the procedure well. The quality of the                            bowel preparation was poor. The ileocecal valve,                            appendiceal orifice, and rectum were photographed. Scope In: 9:45:31 AM Scope Out: 9:55:02 AM Scope Withdrawal Time: 0 hours 4 minutes 10 seconds  Total Procedure Duration: 0 hours 9 minutes 31 seconds  Findings:                 The perianal and digital rectal examinations were                            normal.                           A large amount of stool was found in the entire  colon, precluding visualization. Lavage of the area                            was performed using copious amounts of tap water,                            resulting in incomplete clearance with continued                            poor visualization. Complications:            No immediate complications. Estimated Blood Loss:     Estimated blood loss: none. Impression:               - Preparation of the colon was poor.                           - Stool in the entire examined colon. Very limited                            views of the colon due to poor prep. Cannot rule                            out the presence of polyps on this study.                           - No specimens collected. Recommendation:           - Patient has a contact number available for                            emergencies. The signs and symptoms of potential                             delayed complications were discussed with the                            patient. Return to normal activities tomorrow.                            Written discharge instructions were provided to the                            patient.                           - Resume previous diet.                           - Continue present medications.                           - Repeat colonoscopy within 1 year because the                            bowel preparation was poor. Will need to discuss  alternative prep strategies, as the 2-day prep for                            this study was not successful.                           - Return to GI office PRN. Doristine Locks, MD 12/22/2022 10:00:44 AM

## 2022-12-22 NOTE — Progress Notes (Signed)
Uneventful anesthetic. Report to pacu rn. Vss. Care resumed by rn. 

## 2022-12-22 NOTE — Patient Instructions (Signed)
Resume previous diet Continue present medications You will need another screening colonoscopy in 1 year, with a 2 day prep plus other strategies to help with cleaning out your colon. In one year,  you will receive a letter to let you know to schedule the procedure.   Please call us at [phone] if you have a change in bowel habits, change in family history of colo-rectal cancer, rectal bleeding or other GI concern before that time. Handout given for chronic constipation  YOU HAD AN ENDOSCOPIC PROCEDURE TODAY AT THE [facility]:   Refer to the procedure report that was given to you for any specific questions about what was found during the examination.  If the procedure report does not answer your questions, please call your gastroenterologist to clarify.  If you requested that your care partner not be given the details of your procedure findings, then the procedure report has been included in a sealed envelope for you to review at your convenience later.  YOU SHOULD EXPECT: Some feelings of bloating in the abdomen. Passage of more gas than usual.  Walking can help get rid of the air that was put into your GI tract during the procedure and reduce the bloating. If you had a lower endoscopy (such as a colonoscopy or flexible sigmoidoscopy) you may notice spotting of blood in your stool or on the toilet paper. If you underwent a bowel prep for your procedure, you may not have a normal bowel movement for a few days.  Please Note:  You might notice some irritation and congestion in your nose or some drainage.  This is from the oxygen used during your procedure.  There is no need for concern and it should clear up in a day or so.  SYMPTOMS TO REPORT IMMEDIATELY:  Following lower endoscopy (colonoscopy):  Excessive amounts of blood in the stool  Significant tenderness or worsening of abdominal pains  Swelling of the abdomen that is new, acute  Fever of 100F or higher  For urgent or  emergent issues, a gastroenterologist can be reached at any hour by calling [phone]. Do not use MyChart messaging for urgent concerns.    DIET:  We do recommend a small meal at first, but then you may proceed to your regular diet.  Drink plenty of fluids but you should avoid alcoholic beverages for 24 hours.  ACTIVITY:  You should plan to take it easy for the rest of today and you should NOT DRIVE or use heavy machinery until tomorrow (because of the sedation medicines used during the test).    FOLLOW UP: Our staff will call the number listed on your records the next business day following your procedure.  We will call around 7:15- 8:00 am to check on you and address any questions or concerns that you may have regarding the information given to you following your procedure. If we do not reach you, we will leave a message.      SIGNATURES/CONFIDENTIALITY: You and/or your care partner have signed paperwork which will be entered into your electronic medical record.  These signatures attest to the fact that that the information above on your After Visit Summary has been reviewed and is understood.  Full responsibility of the confidentiality of this discharge information lies with you and/or your care-partner.
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
## 2022-12-23 ENCOUNTER — Telehealth: Payer: Self-pay | Admitting: Gastroenterology

## 2022-12-23 ENCOUNTER — Telehealth: Payer: Self-pay

## 2022-12-23 DIAGNOSIS — K5909 Other constipation: Secondary | ICD-10-CM

## 2022-12-23 DIAGNOSIS — Z1211 Encounter for screening for malignant neoplasm of colon: Secondary | ICD-10-CM

## 2022-12-23 DIAGNOSIS — G8929 Other chronic pain: Secondary | ICD-10-CM

## 2022-12-23 NOTE — Telephone Encounter (Signed)
Cologuard test ordered. Patient is notified.  Patient stated she feels like she has a blockage would like to have that checked with an xray. Please advise. Stated just had CT done on 09/15/22 may not be necessary to redo at this time.

## 2022-12-23 NOTE — Telephone Encounter (Signed)
Attempted to reach patient for post-procedure f/u call. No answer. Left message for her to please not hesitate to call if she has any questions/concerns regarding her care. 

## 2022-12-23 NOTE — Telephone Encounter (Signed)
-----   Message from [facility] V, DO sent at 12/22/2022  3:50 PM EDT ----- Patient had another failed prep colonoscopy today. Last was in 2017. Please try to coordinate the following:  - Cologuard test to complete CRC screening - CT abdomen/pelvis with contrast to evaluate chronic generalized/upper abdominal pain  Thanks!

## 2022-12-23 NOTE — Telephone Encounter (Signed)
Returned call to patient. Left patient a detailed message informing her that based on poor prep from yesterdays exam, Dr. Barron Alvine has recommended repeating colonoscopy within a year. Pt will need an alternative prep since she still had stool in her colon with a 2 day prep. I advised patient to call back if she has any questions.

## 2022-12-23 NOTE — Telephone Encounter (Signed)
Patient called requesting to speak about unsuccessful colonoscopy yesterday 5/30. Would like to discuss next steps to take. Please advise, thank you.

## 2022-12-26 NOTE — Telephone Encounter (Signed)
Per Dr. Barron Alvine abdominal xray is ordered. Patient is notified to come to our office at the basement level at her convenience to have xray done.

## 2022-12-27 ENCOUNTER — Ambulatory Visit (INDEPENDENT_AMBULATORY_CARE_PROVIDER_SITE_OTHER)
Admission: RE | Admit: 2022-12-27 | Discharge: 2022-12-27 | Disposition: A | Discharge status: Home / Self Care | Payer: No Typology Code available for payment source | Source: Ambulatory Visit | Attending: Gastroenterology | Admitting: Gastroenterology

## 2022-12-27 DIAGNOSIS — K5909 Other constipation: Secondary | ICD-10-CM | POA: Diagnosis not present

## 2022-12-27 DIAGNOSIS — R1010 Upper abdominal pain, unspecified: Secondary | ICD-10-CM | POA: Diagnosis not present

## 2022-12-27 DIAGNOSIS — G8929 Other chronic pain: Secondary | ICD-10-CM | POA: Diagnosis not present

## 2023-01-17 ENCOUNTER — Encounter: Payer: Self-pay | Admitting: Family Medicine

## 2023-01-17 MED ORDER — PRIMIDONE 50 MG PO TABS: 100.0000 mg | ORAL_TABLET | Freq: Every day | ORAL | 1 refills | Status: DC

## 2023-01-17 MED ORDER — PRIMIDONE 50 MG PO TABS: 100.0000 mg | ORAL_TABLET | Freq: Every day | ORAL | 0 refills | Status: DC

## 2023-02-14 ENCOUNTER — Encounter: Payer: Self-pay | Admitting: Family Medicine

## 2023-02-14 ENCOUNTER — Ambulatory Visit (INDEPENDENT_AMBULATORY_CARE_PROVIDER_SITE_OTHER): Payer: No Typology Code available for payment source

## 2023-02-14 ENCOUNTER — Ambulatory Visit (INDEPENDENT_AMBULATORY_CARE_PROVIDER_SITE_OTHER): Payer: No Typology Code available for payment source | Admitting: Family Medicine

## 2023-02-14 VITALS — BP 122/79 | HR 80 | Resp 16 | Wt 154.1 lb

## 2023-02-14 DIAGNOSIS — R519 Headache, unspecified: Secondary | ICD-10-CM

## 2023-02-14 DIAGNOSIS — G250 Essential tremor: Secondary | ICD-10-CM | POA: Diagnosis not present

## 2023-02-14 DIAGNOSIS — K5901 Slow transit constipation: Secondary | ICD-10-CM

## 2023-02-14 DIAGNOSIS — R635 Abnormal weight gain: Secondary | ICD-10-CM | POA: Diagnosis not present

## 2023-02-14 DIAGNOSIS — M542 Cervicalgia: Secondary | ICD-10-CM | POA: Diagnosis not present

## 2023-02-14 DIAGNOSIS — F339 Major depressive disorder, recurrent, unspecified: Secondary | ICD-10-CM

## 2023-02-14 DIAGNOSIS — N951 Menopausal and female climacteric states: Secondary | ICD-10-CM

## 2023-02-14 DIAGNOSIS — R5383 Other fatigue: Secondary | ICD-10-CM

## 2023-02-14 DIAGNOSIS — G43009 Migraine without aura, not intractable, without status migrainosus: Secondary | ICD-10-CM | POA: Diagnosis not present

## 2023-02-14 DIAGNOSIS — F988 Other specified behavioral and emotional disorders with onset usually occurring in childhood and adolescence: Secondary | ICD-10-CM

## 2023-02-14 MED ORDER — AMPHETAMINE-DEXTROAMPHETAMINE 20 MG PO TABS
20.0000 mg | ORAL_TABLET | Freq: Two times a day (BID) | ORAL | 0 refills | Status: DC
Start: 2023-02-14 — End: 2023-02-14

## 2023-02-14 MED ORDER — PRIMIDONE 50 MG PO TABS
100.0000 mg | ORAL_TABLET | Freq: Every day | ORAL | 3 refills | Status: DC
Start: 2023-02-14 — End: 2023-02-20

## 2023-02-14 MED ORDER — PREDNISONE 10 MG PO TABS: ORAL_TABLET | ORAL | 0 refills | Status: DC

## 2023-02-14 MED ORDER — AMPHETAMINE-DEXTROAMPHETAMINE 20 MG PO TABS
20.0000 mg | ORAL_TABLET | Freq: Two times a day (BID) | ORAL | 0 refills | Status: DC
Start: 2023-02-14 — End: 2023-02-24

## 2023-02-14 MED ORDER — PROCHLORPERAZINE MALEATE 5 MG PO TABS: 5.0000 mg | ORAL_TABLET | Freq: Three times a day (TID) | ORAL | 1 refills | Status: DC

## 2023-02-14 NOTE — Assessment & Plan Note (Addendum)
It could be triggering from her right side of neck.  Get some plain films to start to see if there have been any significant changes in her cervical spine.  They should be a great candidate for formal PT and maybe even some dry needling.  Also consider starting one of the newer Gepants for better migraine control.  Discussed doing a rapid steroid taper to try to break the headache.  Has tried NSAIDs, muscle relaxers, tryptans, steroid, the max which caused memory problems, chronic pain medication, propanolol was too sedating.

## 2023-02-14 NOTE — Assessment & Plan Note (Signed)
Stable on current regimen on Fetzima.

## 2023-02-14 NOTE — Progress Notes (Addendum)
Established Patient Office Visit  Subjective   Patient ID: [name], female    DOB: 07/09/1971  Age: 52 y.o. MRN: [id number]  Chief Complaint  Patient presents with   Medication followup and headaches    HPI F/U HA's -she is been having for one of his daily headaches and at least 2 migraines per week for quite some time.  She is been having some right-sided neck pain and wonders if that could be triggering some of the headaches.  Years ago she was having left-sided neck pain and then after she had surgery they significantly went away.  She was taking too much of the Imitrex her to stop taking it completely and just switch to Excedrin Migraine. Gets occ numbness/tingling in the right arm.   Is also been struggling with fatigue and weight gain.  Still dealing with chronic constipation issues and has been trying to work on that with GI.  Even after 3-day prep she was unable to get a good cleanout for her last colonoscopy.  She will easily break out in a sweat overall up from her torso up to her forehead.  And she has been noticing some heart palpitations later in the evenings well after her Adderall has worn off.  F/U tremor - currently in primidone.   ADD - Reports symptoms are well controlled on current regime. Denies any problems with insomnia, chest pain, palpitations, or SOB.     Flowsheet Row Office Visit from 02/14/2023 in [facility] at [facility] Total Score 0       ROS    Objective:     BP 122/79 (BP Location: Left Arm, Patient Position: Sitting, Cuff Size: Normal)   Pulse 80   Resp 16   Wt 154 lb 1.3 oz (69.9 kg)   SpO2 100%   BMI 25.64 kg/m     Physical Exam Vitals and nursing note reviewed.  Constitutional:      Appearance: She is well-developed.  HENT:     Head: Normocephalic and atraumatic.  Cardiovascular:     Rate and Rhythm: Normal rate and regular rhythm.     Heart sounds: Normal heart sounds.   Pulmonary:     Effort: Pulmonary effort is normal.     Breath sounds: Normal breath sounds.  Skin:    General: Skin is warm and dry.  Neurological:     Mental Status: She is alert and oriented to person, place, and time.  Psychiatric:        Behavior: Behavior normal.      No results found for any visits on 02/14/23.     The ASCVD Risk score (Arnett DK, et al., 2019) failed to calculate for the following reasons:   Cannot find a previous HDL lab   Cannot find a previous total cholesterol lab    Assessment & Plan:   Problem List Items Addressed This Visit       Cardiovascular and Mediastinum   Migraine headache - Primary    It could be triggering from her right side of neck.  Get some plain films to start to see if there have been any significant changes in her cervical spine.  They should be a great candidate for formal PT and maybe even some dry needling.  Also consider starting one of the newer Gepants for better migraine control.  Discussed doing a rapid steroid taper to try to break the headache.  Has tried NSAIDs, muscle  relaxers, tryptans, steroid, the max which caused memory problems, chronic pain medication, propanolol was too sedating.      Relevant Medications   primidone (MYSOLINE) 50 MG tablet   Other Relevant Orders   CMP14+EGFR   Lipid panel   TSH   Cortisol     Nervous and Auditory   Essential tremor    Continue current regimen.  Refilled for 1 year.      Relevant Medications   primidone (MYSOLINE) 50 MG tablet   Other Relevant Orders   CMP14+EGFR   Lipid panel   TSH   Cortisol     Other   Constipation (Chronic)    Working with GI on some options. Tries to use laxatives on the weekends. Wonders if could have OgilvieSyndrome       Depression, recurrent (HCC)    Stable on current regimen on Fetzima.       ADD (attention deficit disorder)    Well controlled. Continue current regimen. Follow up in  46mo       Relevant Medications    amphetamine-dextroamphetamine (ADDERALL) 20 MG tablet   Other Visit Diagnoses     Hot flash, menopausal       Relevant Orders   CMP14+EGFR   Lipid panel   TSH   Cortisol   Abnormal weight gain       Relevant Orders   CMP14+EGFR   Lipid panel   TSH   Cortisol   Other fatigue       Relevant Orders   CMP14+EGFR   Lipid panel   TSH   Cortisol   DG Cervical Spine Complete   Cervical pain       Relevant Orders   DG Cervical Spine Complete      Right-sided cervical pain-will get up-to-date films today as it has been going on for several months and is likely triggering some of the more frequent headaches and migraines.  Fatigue/weight gain/hot flashes and sweating-certainly could be menopausal related or possible bleed due to abnormal cortisol levels.  Will check blood work today.  Also evaluate for thyroid dysfunction.  Return in about 4 months (around 06/17/2023) for Migraines .    Nani Gasser, MD

## 2023-02-14 NOTE — Assessment & Plan Note (Signed)
Well controlled. Continue current regimen. Follow up in  6 mo  

## 2023-02-14 NOTE — Assessment & Plan Note (Signed)
Continue current regimen.  Refilled for 1 year.

## 2023-02-14 NOTE — Assessment & Plan Note (Addendum)
Working with GI on some options. Tries to use laxatives on the weekends. Wonders if could have [employer]

## 2023-02-15 LAB — CMP14+EGFR
ALT: 12 IU/L (ref 0–32)
AST: 18 IU/L (ref 0–40)
Albumin: 4.5 g/dL (ref 3.8–4.9)
Alkaline Phosphatase: 116 IU/L (ref 44–121)
BUN/Creatinine Ratio: 14 (ref 9–23)
BUN: 9 mg/dL (ref 6–24)
Bilirubin Total: 0.3 mg/dL (ref 0.0–1.2)
CO2: 25 mmol/L (ref 20–29)
Calcium: 9.5 mg/dL (ref 8.7–10.2)
Chloride: 99 mmol/L (ref 96–106)
Creatinine, Ser: 0.64 mg/dL (ref 0.57–1.00)
Globulin, Total: 2.1 g/dL (ref 1.5–4.5)
Glucose: 79 mg/dL (ref 70–99)
Potassium: 4.4 mmol/L (ref 3.5–5.2)
Sodium: 139 mmol/L (ref 134–144)
Total Protein: 6.6 g/dL (ref 6.0–8.5)
eGFR: 105 mL/min/{1.73_m2} (ref >59)

## 2023-02-15 LAB — CORTISOL: Cortisol: 10.9 ug/dL (ref 6.2–19.4)

## 2023-02-15 LAB — LIPID PANEL
Chol/HDL Ratio: 3.6 ratio (ref 0.0–4.4)
Cholesterol, Total: 260 mg/dL — ABNORMAL HIGH (ref 100–199)
HDL: 72 mg/dL (ref >39)
LDL Chol Calc (NIH): 166 mg/dL — ABNORMAL HIGH (ref 0–99)
Triglycerides: 126 mg/dL (ref 0–149)
VLDL Cholesterol Cal: 22 mg/dL (ref 5–40)

## 2023-02-15 LAB — TSH: TSH: 1.34 u[IU]/mL (ref 0.450–4.500)

## 2023-02-15 NOTE — Progress Notes (Signed)
Hi [name], your LDL is high at 166, goal under 100. Work on [employer] and staying active.  Thyroid looks great and metabolic panel is normal. Please schedule your mammo when you get a chance.   The 10-year ASCVD risk score (Arnett DK, et al., 2019) is: 4.2%   Values used to calculate the score:     Age: 52 years     Sex: Female     Is Non-Hispanic African American: No     Diabetic: No     Tobacco smoker: Yes     Systolic Blood Pressure: 122 mmHg     Is BP treated: No     HDL Cholesterol: 72 mg/dL     Total Cholesterol: 260 mg/dL

## 2023-02-20 ENCOUNTER — Encounter: Payer: Self-pay | Admitting: Family Medicine

## 2023-02-20 DIAGNOSIS — G250 Essential tremor: Secondary | ICD-10-CM

## 2023-02-20 MED ORDER — PRIMIDONE 50 MG PO TABS: 100.0000 mg | ORAL_TABLET | Freq: Every day | ORAL | 0 refills | Status: DC

## 2023-02-22 NOTE — Progress Notes (Signed)
HI [name], for the delay in getting back to you on the results.  When we switched over labs are in baskets have been a bit of a mess but things have leveled off this week and so they were just getting caught back up.  They do see some arthritis at the facet joints where the hinge parts of the spine at multiple levels but that part appears to be mild.  No fractures or subluxations which is great.  Think formal physical therapy would be a great option and maybe even with some dry needling.  Do you want to consider doing that somewhere close to home or work?  I know we also talked about the newer class of injectable medications for migraine headaches.  Be interested in doing that?  We could see which ones might be covered with your plan.

## 2023-02-23 ENCOUNTER — Telehealth: Payer: Self-pay | Admitting: Family Medicine

## 2023-02-23 ENCOUNTER — Encounter: Payer: Self-pay | Admitting: Family Medicine

## 2023-02-23 DIAGNOSIS — G43009 Migraine without aura, not intractable, without status migrainosus: Secondary | ICD-10-CM

## 2023-02-23 DIAGNOSIS — F988 Other specified behavioral and emotional disorders with onset usually occurring in childhood and adolescence: Secondary | ICD-10-CM

## 2023-02-23 NOTE — Telephone Encounter (Signed)
Patient called in for a 30 day supply instead for Adderall. Pharmacy states that he cannot fill that QTY for Adderall. Please Advise.  [facility] [address] [place], [place]

## 2023-02-24 MED ORDER — NURTEC 75 MG PO TBDP
1.0000 | ORAL_TABLET | ORAL | 1 refills | Status: DC
Start: 2023-02-24 — End: 2023-04-20

## 2023-02-24 MED ORDER — AMPHETAMINE-DEXTROAMPHETAMINE 20 MG PO TABS
20.0000 mg | ORAL_TABLET | Freq: Two times a day (BID) | ORAL | 0 refills | Status: DC
Start: 2023-03-25 — End: 2024-03-19

## 2023-02-24 MED ORDER — AMPHETAMINE-DEXTROAMPHETAMINE 20 MG PO TABS
20.0000 mg | ORAL_TABLET | Freq: Two times a day (BID) | ORAL | 0 refills | Status: DC
Start: 2023-04-24 — End: 2023-06-12

## 2023-02-24 NOTE — Telephone Encounter (Signed)
Meds ordered this encounter  Medications   amphetamine-dextroamphetamine (ADDERALL) 20 MG tablet    Sig: Take 1 tablet (20 mg total) by mouth 2 (two) times daily.    Dispense:  60 tablet    Refill:  0   amphetamine-dextroamphetamine (ADDERALL) 20 MG tablet    Sig: Take 1 tablet (20 mg total) by mouth 2 (two) times daily.    Dispense:  60 tablet    Refill:  0    Okay, please call patient and let her know just in case she is not aware.  And let her know I went ahead and sent the next 2 prescriptions so they should have the next 2 on file going forward.

## 2023-02-24 NOTE — Telephone Encounter (Signed)
Left message with patient

## 2023-03-07 ENCOUNTER — Telehealth: Payer: Self-pay | Admitting: Family Medicine

## 2023-03-07 ENCOUNTER — Telehealth: Payer: Self-pay | Admitting: Gastroenterology

## 2023-03-07 DIAGNOSIS — F339 Major depressive disorder, recurrent, unspecified: Secondary | ICD-10-CM

## 2023-03-07 MED ORDER — FETZIMA 40 MG PO CP24
40.0000 mg | ORAL_CAPSULE | Freq: Every day | ORAL | 1 refills | Status: DC
Start: 2023-03-07 — End: 2023-03-17

## 2023-03-07 NOTE — Telephone Encounter (Signed)
Called in for a refill on Fetzima 40mg . Needs it sent to a different Pharmacy. Pharmacy does not accept electronic rx's. Must be manually faxed   [facility] [phone] F: [phone]

## 2023-03-07 NOTE — Telephone Encounter (Signed)
Rx printed and placed in provider's box

## 2023-03-07 NOTE — Telephone Encounter (Signed)
Inbound call from [facility] regarding refill for Motegrity for patient. Stated patient has a new mailing pharmacy she  would like prescription sent to. Requesting a call back to the patient. Please advise, thank you.

## 2023-03-08 MED ORDER — MOTEGRITY 2 MG PO TABS: 1.0000 | ORAL_TABLET | Freq: Every day | ORAL | 2 refills | Status: DC

## 2023-03-08 NOTE — Telephone Encounter (Signed)
Pharmacy called and patient would like [place] to fax her Motegrity to a pharmacy in [place] called [facility]. Their fax number is [phone]. Script faxed.

## 2023-03-13 ENCOUNTER — Encounter: Payer: Self-pay | Admitting: Family Medicine

## 2023-03-17 ENCOUNTER — Encounter: Payer: Self-pay | Admitting: Family Medicine

## 2023-03-17 DIAGNOSIS — F339 Major depressive disorder, recurrent, unspecified: Secondary | ICD-10-CM

## 2023-03-17 MED ORDER — FETZIMA 40 MG PO CP24: 40.0000 mg | ORAL_CAPSULE | Freq: Every day | ORAL | 0 refills | Status: DC

## 2023-03-20 ENCOUNTER — Other Ambulatory Visit: Payer: Self-pay | Admitting: Family Medicine

## 2023-03-20 NOTE — Telephone Encounter (Signed)
Can we check on PA for nurtec

## 2023-03-21 ENCOUNTER — Encounter: Payer: Self-pay | Admitting: Family Medicine

## 2023-03-23 ENCOUNTER — Telehealth: Payer: Self-pay | Admitting: Family Medicine

## 2023-03-23 NOTE — Telephone Encounter (Signed)
[name] with [facility] called. He states he cannot dispense 20 of the Fetzima because it comes in 1 bottle of 30(the reminder or 10 will only sit on his shelf). Please write script for 30. Telephone [phone].

## 2023-03-24 ENCOUNTER — Telehealth: Payer: Self-pay

## 2023-03-24 DIAGNOSIS — F339 Major depressive disorder, recurrent, unspecified: Secondary | ICD-10-CM

## 2023-03-24 MED ORDER — FETZIMA 40 MG PO CP24: 40.0000 mg | ORAL_CAPSULE | Freq: Every day | ORAL | 1 refills | Status: DC

## 2023-03-24 NOTE — Telephone Encounter (Signed)
Patient called stating that [facility] pharmacy will not fill her rx. The quantity for Fetzima needs to be changed from #20 to #30. Please send an updated rx to [facility] pharmacy. Thanks in advance.

## 2023-03-24 NOTE — Telephone Encounter (Signed)
Message sent to patient via Mychart.

## 2023-03-24 NOTE — Telephone Encounter (Signed)
Will fwd to pcp for advice.

## 2023-03-24 NOTE — Telephone Encounter (Signed)
Ok resent. Not sure why refilled for 20. She was just here in July   Meds ordered this encounter  Medications   Levomilnacipran HCl ER (FETZIMA) 40 MG CP24    Sig: Take 40 mg by mouth daily. [facility] [phone] F: [phone]    Dispense:  90 capsule    Refill:  1

## 2023-03-31 ENCOUNTER — Telehealth: Payer: Self-pay

## 2023-03-31 NOTE — Telephone Encounter (Signed)
Initiated Prior authorization for: Via: CovermymedsNurtec 75MG  dispersible tablets  Case/Key:[id number] Status: Pending as of 03/31/23 Reason: Notified Pt via: Mychart

## 2023-04-17 ENCOUNTER — Encounter: Payer: Self-pay | Admitting: Family Medicine

## 2023-04-17 DIAGNOSIS — G43009 Migraine without aura, not intractable, without status migrainosus: Secondary | ICD-10-CM

## 2023-04-19 ENCOUNTER — Telehealth: Payer: Self-pay | Admitting: Family Medicine

## 2023-04-19 NOTE — Telephone Encounter (Signed)
Patient called in stating that her PA for Nurtec ODT was approved for QTY: 24 for 90 day supply.

## 2023-04-20 MED ORDER — NURTEC 75 MG PO TBDP
1.0000 | ORAL_TABLET | Freq: Every day | ORAL | 3 refills | Status: DC | PRN
Start: 2023-04-20 — End: 2023-07-05

## 2023-05-12 ENCOUNTER — Other Ambulatory Visit: Payer: Self-pay | Admitting: Family Medicine

## 2023-05-12 DIAGNOSIS — G250 Essential tremor: Secondary | ICD-10-CM

## 2023-06-01 ENCOUNTER — Encounter: Payer: Self-pay | Admitting: Family Medicine

## 2023-06-01 MED ORDER — QUETIAPINE FUMARATE 100 MG PO TABS: 200.0000 mg | ORAL_TABLET | Freq: Every day | ORAL | 0 refills | Status: DC

## 2023-06-01 MED ORDER — QUETIAPINE FUMARATE 100 MG PO TABS: 200.0000 mg | ORAL_TABLET | Freq: Every day | ORAL | 2 refills | Status: DC

## 2023-06-12 ENCOUNTER — Encounter: Payer: Self-pay | Admitting: Family Medicine

## 2023-06-12 DIAGNOSIS — F988 Other specified behavioral and emotional disorders with onset usually occurring in childhood and adolescence: Secondary | ICD-10-CM

## 2023-06-12 MED ORDER — AMPHETAMINE-DEXTROAMPHETAMINE 20 MG PO TABS
20.0000 mg | ORAL_TABLET | Freq: Two times a day (BID) | ORAL | 0 refills | Status: DC
Start: 2023-06-12 — End: 2024-03-19

## 2023-06-13 ENCOUNTER — Other Ambulatory Visit: Payer: Self-pay | Admitting: Family Medicine

## 2023-07-05 ENCOUNTER — Encounter: Payer: Self-pay | Admitting: Gastroenterology

## 2023-07-05 ENCOUNTER — Ambulatory Visit: Payer: No Typology Code available for payment source | Admitting: Gastroenterology

## 2023-07-05 VITALS — BP 130/76 | HR 76 | Ht 65.0 in | Wt 159.0 lb

## 2023-07-05 DIAGNOSIS — R140 Abdominal distension (gaseous): Secondary | ICD-10-CM | POA: Diagnosis not present

## 2023-07-05 DIAGNOSIS — K5909 Other constipation: Secondary | ICD-10-CM

## 2023-07-05 DIAGNOSIS — Z860100 Personal history of colon polyps, unspecified: Secondary | ICD-10-CM

## 2023-07-05 DIAGNOSIS — K2100 Gastro-esophageal reflux disease with esophagitis, without bleeding: Secondary | ICD-10-CM | POA: Diagnosis not present

## 2023-07-05 DIAGNOSIS — R109 Unspecified abdominal pain: Secondary | ICD-10-CM

## 2023-07-05 MED ORDER — DICYCLOMINE HCL 10 MG PO CAPS: 10.0000 mg | ORAL_CAPSULE | Freq: Four times a day (QID) | ORAL | 3 refills | Status: AC | PRN
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
## 2023-07-05 NOTE — Patient Instructions (Signed)
_______________________________________________________  If your blood pressure at your visit was 140/90 or greater, please contact your primary care physician to follow up on this.  If you are age 54 or younger, your body mass index should be between 19-25. Your Body mass index is 26.46 kg/m. If this is out of the aformentioned range listed, please consider follow up with your Primary Care Provider.  ________________________________________________________  The [facility] providers would like to encourage you to use [facility] to communicate with providers for non-urgent requests or questions.  Due to long hold times on the telephone, sending your provider a message by [facility] may be a faster and more efficient way to get a response.  Please allow 48 business hours for a response.  Please remember that this is for non-urgent requests.  _______________________________________________________  Your provider has requested that you have an abdominal x ray in 5 days. Please go to the basement floor to our Radiology department for the test.  Due to recent changes in healthcare laws, you may see the results of your imaging and laboratory studies on MyChart before your provider has had a chance to review them.  We understand that in some cases there may be results that are confusing or concerning to you. Not all laboratory results come back in the same time frame and the provider may be waiting for multiple results in order to interpret others.  Please give [place] 48 hours in order for your provider to thoroughly review all the results before contacting the office for clarification of your results.   We have sent the following medications to your pharmacy for you to pick up at your convenience:  START: Bentyl 10mg  one tablet by mouth every 6 hours as needed.  Please purchase the following medications over the counter and take as directed:  Dulcolax 5mg  take every day.  Complete Sitzmarks study.  You have  been scheduled to have an anorectal manometry at [facility] on 11-01-23 at 10:30am. Please arrive 30 minutes prior to your appointment time for registration (1st floor of the hospital-admissions).  Please make certain to use 1 Fleets enema 2 hours prior to coming for your appointment. You can purchase Fleets enemas from the laxative section at your drug store. You should not eat anything during the two hours prior to the procedure. You may take regular medications with small sips of water at least 2 hours prior to the study.  Anorectal manometry is a test performed to evaluate patients with constipation or fecal incontinence. This test measures the pressures of the anal sphincter muscles, the sensation in the rectum, and the neural reflexes that are needed for normal bowel movements.  THE PROCEDURE The test takes approximately 30 minutes to 1 hour. You will be asked to change into a hospital gown. A technician or nurse will explain the procedure to you, take a brief health history, and answer any questions you may have. The patient then lies on his or her left side. A small, flexible tube, about the size of a thermometer, with a balloon at the end is inserted into the rectum. The catheter is connected to a machine that measures the pressure. During the test, the small balloon attached to the catheter may be inflated in the rectum to assess the normal reflex pathways. The nurse or technician may also ask the person to squeeze, relax, and push at various times. The anal sphincter muscle pressures are measured during each of these maneuvers. To squeeze, the patient tightens the  sphincter muscles as if trying to prevent anything from coming out. To push or bear down, the patient strains down as if trying to have a bowel movement.  It was a pleasure to see you today!  Vito Cirigliano, D.O.

## 2023-07-10 ENCOUNTER — Other Ambulatory Visit: Payer: Self-pay | Admitting: Family Medicine

## 2023-07-10 ENCOUNTER — Ambulatory Visit
Admission: RE | Admit: 2023-07-10 | Discharge: 2023-07-10 | Disposition: A | Discharge status: Home / Self Care | Payer: No Typology Code available for payment source | Source: Ambulatory Visit | Attending: Gastroenterology

## 2023-07-10 DIAGNOSIS — K5909 Other constipation: Secondary | ICD-10-CM | POA: Diagnosis not present

## 2023-07-10 DIAGNOSIS — R140 Abdominal distension (gaseous): Secondary | ICD-10-CM | POA: Diagnosis not present

## 2023-07-27 ENCOUNTER — Telehealth: Payer: Self-pay | Admitting: Gastroenterology

## 2023-07-27 NOTE — Telephone Encounter (Signed)
 Patient called and stated they where returning [place] call. Patient is requesting a call back. Please advise.

## 2023-07-27 NOTE — Telephone Encounter (Signed)
 Left message for patient to return call to our office to further discuss Xray results. Will continue efforts.

## 2023-07-27 NOTE — Telephone Encounter (Signed)
 Patient aware of Xray results per Dr San.  Patient aware that she will be referred to [facility] for further evaluation. Patient agreed to plan and verbalized understanding.  No further questions.   Referral faxed to [facility] faxed to [phone].  Patient advised to contact our office in 2 to 3 weeks if she has not heard from the clinic.

## 2023-08-08 ENCOUNTER — Other Ambulatory Visit: Payer: Self-pay | Admitting: Family Medicine

## 2023-08-08 DIAGNOSIS — F339 Major depressive disorder, recurrent, unspecified: Secondary | ICD-10-CM

## 2023-08-08 NOTE — Telephone Encounter (Signed)
 Inbound call from patient stating that she has not heard anything from [facility] about her referral. She also states that's she is still having issues, requesting a call to discuss. Please advise.

## 2023-08-08 NOTE — Telephone Encounter (Signed)
 Returned call to the patient to make her aware that a fax was received from [facility] on 07-31-23 regarding the new patient referral that we had sent.  Patient advised they needed insurance information faxed to them to see if her insurance would cover Peristeen, which is a transanal irrigation system.  Insurance information was faxed on 08-01-23 to [employer].  We have not heard anything else from [facility] since that time.   Patient is calling today with complaints of nausea and vomiting each time she eats.  This started  yesterday.  Patient states that she can not remember having a BM in 3 weeks. She reports no fever.  She is very bloated with abdominal cramping and after she vomits she is having extreme right sided abdominal pain.  Patient was in tears with me on the phone.   Please advise.

## 2023-08-08 NOTE — Telephone Encounter (Signed)
 Returned call to the patient and advised her to proceed to nearest ED for evaluation and imaging per Dr Rennis verbal order.  Patient refused to go to ED.  Patient stated, the last time I went to the ED for something like this, they did a CT scan and said I had constipation.  They sent me home and told me to use Miralax.    Patient stated she has been using dulcolax dailiy and completed a colon purge, but has not produced a bowel movement.  The importance of an evaluation at the ED was expressed to the patient and advised that she could have a bowel blockage.  Patient again refused to seek care.  I explained to the patient that the referral has been made to [facility], but there has not been any correspondence from [facility] since 08-01-23.  I also explained to the patient that [facility] is considered a tertiary care  facility and the wait times for appointments are very long.  Patient advised to seek medical attention should she develop any worsening symptoms such as fever, chills, vomiting blood, etc. Patient agreed to plan and verbalized understanding.  No further questions.

## 2023-08-10 ENCOUNTER — Other Ambulatory Visit: Payer: Self-pay | Admitting: *Deleted

## 2023-08-10 ENCOUNTER — Encounter: Payer: Self-pay | Admitting: Family Medicine

## 2023-08-15 ENCOUNTER — Other Ambulatory Visit: Payer: Self-pay

## 2023-08-15 ENCOUNTER — Telehealth: Payer: Self-pay | Admitting: Gastroenterology

## 2023-08-15 MED ORDER — ONDANSETRON HCL 4 MG PO TABS: 4.0000 mg | ORAL_TABLET | Freq: Four times a day (QID) | ORAL | 3 refills | Status: DC | PRN

## 2023-08-15 NOTE — Telephone Encounter (Signed)
Script sent to pharmacy, pt aware. 

## 2023-08-15 NOTE — Telephone Encounter (Signed)
Inbound call from patient requesting a call to discuss if she can get Zorfran for her issues with nausea. Please advise.

## 2023-08-15 NOTE — Telephone Encounter (Signed)
Pt states she has terrible constipation along with nausea. She reports she is now vomiting after she eats. Pt is requesting zofran be called in for the nausea. Please advise.

## 2023-08-21 NOTE — Telephone Encounter (Signed)
Called to [facility] to check the status of referral to motility clinic.  Spoke with Thayer Ohm who stated that Clydie Braun was not available at this time.  She took my name and phone number and will have Clydie Braun, RN to return my call to further discuss.

## 2023-08-22 NOTE — Telephone Encounter (Signed)
Incoming fax from [facility] [place], [place]) that referral to motility clinic was deferred by Dr Jacinto Reap because the patient is currently on narcotic medications.  Will make Dr Barron Alvine aware for further recommendations.   Fax placed in Dr Frankey Shown inbox for his review.

## 2023-08-25 NOTE — Telephone Encounter (Signed)
Attempted to fax referral to [facility] multiple times with no success stating fax is "busy".  Will continue efforts.

## 2023-08-29 NOTE — Telephone Encounter (Signed)
Faxed the [facility] referral again for this patient, waiting for confirmation to see if it went through this time.

## 2023-08-31 NOTE — Telephone Encounter (Signed)
 I attempted to fax the referral several times on 08-30-23 with no success.  I attempted to fax the referral on the morning of 08-31-23 with no success.  I called to [facility] and spoke with Lonni.  I explained to Lonni that I was unable to send a referral to their clinic.  He spoke with his manager who stated that they were receiving other faxes and referrals and that we could try using the online portal.  I spoke with Tylene to find out how to send a referral through the online portal.  She has attempted to create a login two separate times.  She has also left several messages and sent emails to the person in charge of the online portal with no response.  She has a note to speak with Dr Suzann to see if there is a more effective way for our office to referral our patients to the [facility].    I called the patient and left a message for her to return a call to our office to make her aware of the plan.  I also scheduled her for a follow up appointment at the end of February in our office.

## 2023-09-01 NOTE — Telephone Encounter (Signed)
 Patient called and stated that she was returning her call to Peterson Brandt and is requesting Peterson Brandt return her call. Please advise.

## 2023-09-04 NOTE — Telephone Encounter (Signed)
 Left message on voicemail to make patient aware that we are waiting to hear from [facility] about an appointment.  Advised that she has a follow up appointment in our office on 09-19-23 at 3pm.  Advised that once I hear from [facility], I will make patient aware of appointment date and time.

## 2023-09-11 NOTE — Telephone Encounter (Signed)
 Received fax from [facility] stating that the GI referral for this patient has been denied as this is considered a second opinion.  The letter reads, " Patients managed by a gastroenterologist in the past three years are considered a second opinion."  Please advise.

## 2023-09-12 NOTE — Telephone Encounter (Signed)
 Phone call to [facility] and spoke with Sonny Masters, Charity fundraiser.  I asked Candace to review this case again and to please consider this an escalation of care with more treatment options at the larger, academic center rather than a second opinion.  Candace advised that referral be sent to [facility] as [facility] will not reconsider this referral.

## 2023-09-12 NOTE — Telephone Encounter (Signed)
 Referral to [facility] was faxed to [phone].

## 2023-09-14 ENCOUNTER — Other Ambulatory Visit: Payer: Self-pay | Admitting: Family Medicine

## 2023-09-19 ENCOUNTER — Ambulatory Visit: Payer: No Typology Code available for payment source | Admitting: Gastroenterology

## 2023-09-29 ENCOUNTER — Telehealth: Payer: Self-pay

## 2023-09-29 NOTE — Telephone Encounter (Signed)
 [facility] to check on the status of the referral that was faxed on 09-12-23.  Per Crystaline with [facility] the referral was received and they have tried multiple times to reach the patient by phone and text message.  They have left several messages including a message this morning.  I called the patient and left a voicemail asking her to call [facility] at [phone] to schedule an appointment.

## 2023-10-27 ENCOUNTER — Telehealth: Payer: Self-pay

## 2023-10-27 NOTE — Telephone Encounter (Signed)
 Called the patient to confirm that procedure is an anorectal manometry rather than an EGD.  Patient stated she is not going to be able to make appointment date and time as she is currently uninsured.  Patient aware that next appointment date and time for anorectal manometry is in October 2025.  Patient wanted to Dr Barron Alvine to be made aware.

## 2023-10-27 NOTE — Telephone Encounter (Signed)
 Procedure:EGD Procedure date: 11/01/23 Procedure location: [facility] Arrival Time: 9:00 Spoke with the patient Y/N: N Any prep concerns? N  Has the patient obtained the prep from the pharmacy ? N Do you have a care partner and transportation: N Any additional concerns? N  I called patient twice no answer the third time I called I left a detail message  About the patient procedure

## 2023-11-01 ENCOUNTER — Ambulatory Visit (HOSPITAL_COMMUNITY)
Admission: RE | Admit: 2023-11-01 | Payer: No Typology Code available for payment source | Source: Home / Self Care | Admitting: Gastroenterology

## 2023-11-01 ENCOUNTER — Encounter (HOSPITAL_COMMUNITY): Admission: RE | Payer: Self-pay | Source: Home / Self Care

## 2023-11-01 SURGERY — MANOMETRY, ANORECTAL
Anesthesia: Monitor Anesthesia Care

## 2023-11-05 ENCOUNTER — Other Ambulatory Visit: Payer: Self-pay | Admitting: Family Medicine

## 2023-11-05 DIAGNOSIS — G250 Essential tremor: Secondary | ICD-10-CM

## 2023-11-14 ENCOUNTER — Encounter: Payer: Self-pay | Admitting: Family Medicine

## 2023-11-14 ENCOUNTER — Other Ambulatory Visit: Payer: Self-pay

## 2023-11-14 DIAGNOSIS — G250 Essential tremor: Secondary | ICD-10-CM

## 2023-11-14 MED ORDER — PRIMIDONE 50 MG PO TABS: 100.0000 mg | ORAL_TABLET | Freq: Every day | ORAL | 0 refills | Status: DC

## 2023-11-14 MED ORDER — PROCHLORPERAZINE MALEATE 5 MG PO TABS: 5.0000 mg | ORAL_TABLET | Freq: Three times a day (TID) | ORAL | 0 refills | Status: DC

## 2023-11-26 ENCOUNTER — Emergency Department (HOSPITAL_COMMUNITY)

## 2023-11-26 ENCOUNTER — Encounter (HOSPITAL_COMMUNITY): Payer: Self-pay

## 2023-11-26 ENCOUNTER — Other Ambulatory Visit: Payer: Self-pay

## 2023-11-26 ENCOUNTER — Emergency Department (HOSPITAL_COMMUNITY)
Admission: EM | Admit: 2023-11-26 | Discharge: 2023-11-26 | Disposition: A | Discharge status: Home / Self Care | Attending: Emergency Medicine | Admitting: Emergency Medicine

## 2023-11-26 DIAGNOSIS — Z7982 Long term (current) use of aspirin: Secondary | ICD-10-CM | POA: Insufficient documentation

## 2023-11-26 DIAGNOSIS — Y92009 Unspecified place in unspecified non-institutional (private) residence as the place of occurrence of the external cause: Secondary | ICD-10-CM | POA: Insufficient documentation

## 2023-11-26 DIAGNOSIS — S93402A Sprain of unspecified ligament of left ankle, initial encounter: Secondary | ICD-10-CM | POA: Insufficient documentation

## 2023-11-26 DIAGNOSIS — S82832A Other fracture of upper and lower end of left fibula, initial encounter for closed fracture: Secondary | ICD-10-CM

## 2023-11-26 DIAGNOSIS — S82192A Other fracture of upper end of left tibia, initial encounter for closed fracture: Secondary | ICD-10-CM | POA: Insufficient documentation

## 2023-11-26 DIAGNOSIS — X501XXA Overexertion from prolonged static or awkward postures, initial encounter: Secondary | ICD-10-CM | POA: Insufficient documentation

## 2023-11-26 DIAGNOSIS — M79605 Pain in left leg: Secondary | ICD-10-CM | POA: Diagnosis present

## 2023-11-26 MED ORDER — MORPHINE SULFATE 15 MG PO TABS
30.0000 mg | ORAL_TABLET | ORAL | Status: DC | PRN
  Administered 2023-11-26: 30 mg via ORAL
  Filled 2023-11-26: qty 2

## 2023-11-26 NOTE — ED Provider Notes (Signed)
 [place] [facility]MENT AT [facility] Provider Note   CSN: [id number] Arrival date & time: 11/26/23  4098     History  Chief Complaint  Patient presents with   Ankle Pain    ONETTA WUJEK is a 53 y.o. female.  HPI    53 year old female comes in with chief complaint of ankle and leg pain.  Patient has previous history of surgery to her left foot.  She said that yesterday she had a mechanical fall inside her house.  She was unable to stand up after the fall.  There is significant pain over her left leg, left ankle and the pain shoots towards her hip.  She does not have significant hip tenderness.  Patient does not take any blood thinners.  She denies any headaches, loss of consciousness or direct blunt trauma to the head.  She denies any neck pain, numbness or tingling.  No chest pain, shortness of breath.  Home Medications Prior to Admission medications   Medication Sig Start Date End Date Taking? Authorizing Provider  amphetamine -dextroamphetamine  (ADDERALL) 20 MG tablet Take 1 tablet (20 mg total) by mouth 2 (two) times daily. 03/25/23   Cydney Draft, MD  amphetamine -dextroamphetamine  (ADDERALL) 20 MG tablet Take 1 tablet (20 mg total) by mouth 2 (two) times daily. 06/12/23   Cydney Draft, MD  aspirin-acetaminophen -caffeine (EXCEDRIN MIGRAINE) 250-250-65 MG tablet Take 1 tablet by mouth every 6 (six) hours as needed. 10/22/11   [provider]  dicyclomine  (BENTYL ) 10 MG capsule Take 1 capsule (10 mg total) by mouth every 6 (six) hours as needed for spasms. 07/05/23   Cirigliano, Vito V, DO  FETZIMA  40 MG CP24 TAKE ONE CAPSULE BY MOUTH DAILY 08/10/23   Metheney, Catherine D, MD  lactobacillus acidophilus (BACID) TABS tablet Take 2 tablets by mouth 3 (three) times daily.    [provider]  morphine  (MS CONTIN ) 30 MG 12 hr tablet Take 1 tablet (30 mg total) by mouth 3 (three) times daily for 30 days. Max Daily Amount: 90 mg  08/03/22     morphine  (MSIR) 15 MG tablet Take 1 tablet (15 mg total) by mouth 2 (two) times daily as needed for up to 30 days. 07/31/22     omeprazole  (PRILOSEC) 40 MG capsule TAKE 1 CAPSULE BY MOUTH DAILY. 06/16/23   Cydney Draft, MD  ondansetron  (ZOFRAN ) 4 MG tablet Take 1 tablet (4 mg total) by mouth every 6 (six) hours as needed for nausea or vomiting. 08/15/23   Cirigliano, Vito V, DO  primidone  (MYSOLINE ) 50 MG tablet Take 2 tablets (100 mg total) by mouth at bedtime. 11/14/23   Cydney Draft, MD  prochlorperazine  (COMPAZINE ) 5 MG tablet Take 1 tablet (5 mg total) by mouth 3 (three) times daily. 11/14/23 02/17/24  Cydney Draft, MD  Prucalopride Succinate  (MOTEGRITY ) 2 MG TABS Take 1 tablet (2 mg total) by mouth daily. 03/08/23   Zehr, Jessica D, PA-C  QUEtiapine  (SEROQUEL ) 100 MG tablet TAKE 2 TABLETS BY MOUTH AT BEDTIME 09/15/23   Cydney Draft, MD      Allergies    Chantix [varenicline], Gabapentin, Lyrica [pregabalin], Topamax [topiramate], Clavulanic acid, Codeine , Methadone , Other, Ranitidine hcl, Sulfa antibiotics, Hydrocodone -acetaminophen , Penicillins, Shingrix [zoster vac recomb adjuvanted], Tagamet [cimetidine], Tessalon [benzonatate], and Trazodone  and nefazodone    Review of Systems   Review of Systems  All other systems reviewed and are negative.   Physical Exam Updated Vital Signs BP 139/89 (BP Location: Right Arm)  Pulse 99   Temp 98.1 F (36.7 C) (Oral)   Resp 16   Ht 5\' 5"  (1.651 m)   Wt 72.1 kg   SpO2 97%   BMI 26.45 kg/m  Physical Exam Vitals and nursing note reviewed.  Constitutional:      Appearance: She is well-developed.  HENT:     Head: Atraumatic.  Cardiovascular:     Rate and Rhythm: Normal rate.  Pulmonary:     Effort: Pulmonary effort is normal.  Musculoskeletal:        General: Swelling and tenderness present. No deformity.     Cervical back: Normal range of motion and neck supple.     Comments: Patient has  lateral malleoli ankle swelling.  She has tenderness over both medial and lateral malleoli, and anterior ankle.  Additionally, patient has tenderness over the proximal aspect of her tibia, distal to the tibial plateau and also behind the knee.  She does not have significant knee tenderness.  There is no femur or hip tenderness  Skin:    General: Skin is warm and dry.  Neurological:     Mental Status: She is alert and oriented to person, place, and time.     ED Results / Procedures / Treatments   Labs (all labs ordered are listed, but only abnormal results are displayed) Labs Reviewed - No data to display  EKG None  Radiology DG Tibia/Fibula Left Result Date: 11/26/2023 CLINICAL DATA:  53 year old female status post fall and twisting injury with pain radiating up to the calf. Found down. EXAM: LEFT TIBIA AND FIBULA - 2 VIEW COMPARISON:  Left ankle series today.  Left knee series 10/07/2013. FINDINGS: Four views 0730 hours. There is an oblique mildly comminuted and slightly displaced fracture of the left fibula proximal metadiaphysis. The mid and distal left fibula appear intact, distal tibiofibular alignment appears maintained. Left ankle otherwise detailed separately. Alignment appears maintained at the left knee. Mild medial compartment degenerative spurring since 2015. No definite knee joint effusion. No left tibia fracture identified. Patella and distal femur appear intact. IMPRESSION: 1. Acute mildly comminuted and slightly displaced fracture of the left fibula proximal metadiaphysis. 2. This raises the possibility of Maisonneuve fracture in this setting, but only soft tissue swelling is identified at the left ankle (left ankle series today reported separately). No other fracture or dislocation about the left tib-fib. Electronically Signed   By: Marlise Simpers M.D.   On: 11/26/2023 07:48   DG Ankle Complete Left Result Date: 11/26/2023 CLINICAL DATA:  53 year old female status post fall and  twisting injury with pain radiating up to the calf. Found down. EXAM: LEFT ANKLE COMPLETE - 3+ VIEW COMPARISON:  None Available. FINDINGS: Three views 0728 hours. Bone mineralization is within normal limits for age. Maintained mortise joint alignment and talar dome. Anterior soft tissue swelling. No joint effusion identified. Calcaneus appears intact with mild degenerative spurring. Visible left tibia and fibula intact. Partially visible bone screw within a left foot metatarsal. Grossly intact visible bones of the left foot. IMPRESSION: Anterior soft tissue swelling with no acute fracture or dislocation identified about the left ankle. Electronically Signed   By: Marlise Simpers M.D.   On: 11/26/2023 07:40    Procedures .Splint Application  Date/Time: 11/26/2023 8:42 AM  Performed by: Deatra Face, MD Authorized by: Deatra Face, MD   Consent:    Consent obtained:  Verbal   Consent given by:  Patient   Risks, benefits, and alternatives were discussed: yes  Universal protocol:    Procedure explained and questions answered to patient or proxy's satisfaction: yes     Imaging studies available: yes     Patient identity confirmed:  Arm band Pre-procedure details:    Distal neurologic exam:  Normal   Distal perfusion: distal pulses strong   Procedure details:    Location:  Leg   Leg location:  L upper leg   Splint type:  Long leg   Supplies:  Cotton padding, fiberglass and elastic bandage   Attestation: Splint applied and adjusted personally by me   Post-procedure details:    Distal neurologic exam:  Normal   Distal perfusion: distal pulses strong     Procedure completion:  Tolerated Comments:     Long-leg posterior splint applied with stirrups for the ankle stabilization     Medications Ordered in ED Medications - No data to display  ED Course/ Medical Decision Making/ A&P                                 Medical Decision Making Amount and/or Complexity of Data Reviewed Radiology:  ordered.  Risk Prescription drug management.  53 year old female comes in with chief complaint of mechanical fall. Fall occurred yesterday.  Patient primarily injured her left lower extremity in the process.  Based on initial assessment, differential diagnosis includes knee sprain, ligament tear of the knee, proximal fibular fracture, proximal tibial fracture, ankle fracture, ankle sprain.  Initial plan is to get x-ray of the ankle and tibia.  I independently interpreted patient's x-ray of the tibia, there is clear evidence of proximal fibular fracture, mildly displaced.  X-ray of the ankle does not show any fracture.  Likely a simple Maisonneuve fracture  This finding has been discussed with the patient.  She was thereafter splinted.  I discussed with her high risk for injuries that are missed on x-ray, therefore need for her to have close follow-up with the orthopedic surgeon.  Patient currently has [facility] IR at home for her chronic regional pain syndrome.  Final Clinical Impression(s) / ED Diagnoses Final diagnoses:  Sprain of left ankle, unspecified ligament, initial encounter  Closed fracture of proximal end of left tibia, unspecified fracture morphology, initial encounter    Rx / DC Orders ED Discharge Orders     None         Deatra Face, MD 11/26/23 [phone]

## 2023-11-26 NOTE — Discharge Instructions (Addendum)
 The x-rays of your shin bone confirms a healed fracture below the knee.  The ankle does not show any clear evidence of fracture.  At the minimum, you have sprain of the ankle.  But this type of injury could also signify some severe damage to your ligaments or tendons.  We will be discharging you with leg splint.  No weightbearing allowed, therefore use the crutches when moving around.  Call the orthopedic doctor clinic on Monday, and request an appointment for this week.

## 2023-11-26 NOTE — ED Triage Notes (Signed)
 Pt c/o fall last night twisting her rt ankle and landing on the left. Pt is complaining of left ankle pain that shoots up her calf. Pt states her daughter had to come assist her out of the floor.

## 2023-11-27 ENCOUNTER — Other Ambulatory Visit: Payer: Self-pay

## 2023-11-27 ENCOUNTER — Encounter: Payer: Self-pay | Admitting: Family Medicine

## 2023-11-27 MED ORDER — QUETIAPINE FUMARATE 100 MG PO TABS: 200.0000 mg | ORAL_TABLET | Freq: Every day | ORAL | 0 refills | Status: DC

## 2023-11-30 ENCOUNTER — Ambulatory Visit (INDEPENDENT_AMBULATORY_CARE_PROVIDER_SITE_OTHER): Payer: Self-pay | Admitting: Orthopedic Surgery

## 2023-11-30 ENCOUNTER — Telehealth: Payer: Self-pay | Admitting: Orthopedic Surgery

## 2023-11-30 VITALS — Ht 65.0 in | Wt 158.0 lb

## 2023-11-30 DIAGNOSIS — S82865A Nondisplaced Maisonneuve's fracture of left leg, initial encounter for closed fracture: Secondary | ICD-10-CM
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
## 2023-11-30 NOTE — Progress Notes (Signed)
  Intake history:  There were no vitals taken for this visit. There is no height or weight on file to calculate BMI.    WHAT ARE WE SEEING YOU FOR TODAY?   left ankle(s)  How long has this bothered you? (DOI?DOS?WS?)  on 11/26/23  Anticoag.  No  Diabetes No  Heart disease No  Hypertension No  SMOKING HX Yes  Kidney disease No  Any ALLERGIES ____________ Allergies  Allergen Reactions   Chantix [Varenicline] Other (See Comments)    Mood changes, severe   Gabapentin Other (See Comments)    Memory problems    Lyrica [Pregabalin] Other (See Comments)    Memory problems   Topamax [Topiramate] Other (See Comments)    Memory loss    Clavulanic Acid    Codeine     Methadone  Other (See Comments)    SVT   Other    Ranitidine Hcl    Sulfa Antibiotics    Hydrocodone -Acetaminophen  Nausea And Vomiting        Penicillins Rash        Shingrix [Zoster Vac Recomb Adjuvanted] Rash   Tagamet [Cimetidine] Rash   Tessalon [Benzonatate] Rash   Trazodone  And Nefazodone Other (See Comments)    Restless leg syndrome   __________________________________   Treatment:  Have you taken:  Tylenol  Yes  Advil No  Had PT No  Had injection No  Other  _________________________

## 2023-11-30 NOTE — Telephone Encounter (Signed)
 Dr. Delfino Fellers pt - Dr. Candance Certain w/[facility] in [place] cell# [phone] lvm stating he was returning a page from Dr. Marvina Slough on this pt.

## 2023-11-30 NOTE — Addendum Note (Signed)
 Addended by: Darrin Emerald on: 11/30/2023 05:07 PM   Modules accepted: Level of Service

## 2023-12-01 ENCOUNTER — Telehealth: Payer: Self-pay | Admitting: Orthopedic Surgery

## 2023-12-01 ENCOUNTER — Other Ambulatory Visit: Payer: Self-pay | Admitting: Family Medicine

## 2023-12-01 NOTE — Telephone Encounter (Signed)
 Returned call   She does not have to sleep in the brace

## 2023-12-01 NOTE — Telephone Encounter (Signed)
 Dr. Delfino Fellers pt - spoke w/the pt, she stated that she was seen yesterday and was put in a air cast.  She stated she's in a lot of pain w/it and wants to know if has to sleep in it or can she take it off.  [phone]

## 2023-12-05 ENCOUNTER — Other Ambulatory Visit: Payer: Self-pay | Admitting: *Deleted

## 2023-12-05 MED ORDER — OMEPRAZOLE 40 MG PO CPDR: 40.0000 mg | DELAYED_RELEASE_CAPSULE | Freq: Every day | ORAL | 0 refills | Status: DC

## 2023-12-06 DIAGNOSIS — S82866D Nondisplaced Maisonneuve's fracture of unspecified leg, subsequent encounter for closed fracture with routine healing: Secondary | ICD-10-CM | POA: Insufficient documentation

## 2023-12-07 ENCOUNTER — Encounter: Payer: Self-pay | Admitting: Orthopedic Surgery

## 2023-12-07 ENCOUNTER — Other Ambulatory Visit (INDEPENDENT_AMBULATORY_CARE_PROVIDER_SITE_OTHER): Payer: Self-pay

## 2023-12-07 ENCOUNTER — Ambulatory Visit (INDEPENDENT_AMBULATORY_CARE_PROVIDER_SITE_OTHER): Payer: Self-pay | Admitting: Orthopedic Surgery

## 2023-12-07 DIAGNOSIS — S82865D Nondisplaced Maisonneuve's fracture of left leg, subsequent encounter for closed fracture with routine healing: Secondary | ICD-10-CM

## 2023-12-07 NOTE — Progress Notes (Signed)
   There were no vitals taken for this visit.  There is no height or weight on file to calculate BMI.  Chief Complaint  Patient presents with   Ankle Injury    Left/ walking full weight in boot with single crutch     Encounter Diagnosis  Name Primary?   Closed nondisplaced Maisonneuve fracture of left lower extremity with routine healing, subsequent encounter 11/26/23 Yes    DOI/DOS/ Date: 11/26/23  Unchanged

## 2023-12-07 NOTE — Progress Notes (Signed)
 Chief Complaint  Patient presents with   Ankle Injury    Left/ walking full weight in boot with single crutch    Follow up 1 week   Encounter Diagnosis  Name Primary?   Closed nondisplaced Maisonneuve fracture of left lower extremity with routine healing, subsequent encounter 11/26/23 Yes   Chronic opioid management for RSD  X-rays today show no change in ankle mortise  Swelling is gone down  No signs compartment syndrome  Excellent ankle range of motion dorsiflexion plantarflexion Patient was now compliantly walking with a crutch in the cam walker   DG Ankle Complete Left Result Date: 12/07/2023 Left ankle Maisonneuve fracture Ankle mortise intact Compared to prior x-ray no change Intact ankle mortise for Maisonneuve fracture    We discussed splint versus boot patient lives alone more functional in the boot we stayed with the boot  X-ray again in a week

## 2023-12-08 ENCOUNTER — Other Ambulatory Visit: Payer: Self-pay | Admitting: Family Medicine

## 2023-12-15 ENCOUNTER — Other Ambulatory Visit (INDEPENDENT_AMBULATORY_CARE_PROVIDER_SITE_OTHER): Payer: Self-pay

## 2023-12-15 ENCOUNTER — Encounter: Payer: Self-pay | Admitting: Orthopedic Surgery

## 2023-12-15 ENCOUNTER — Ambulatory Visit (INDEPENDENT_AMBULATORY_CARE_PROVIDER_SITE_OTHER): Admitting: Orthopedic Surgery

## 2023-12-15 DIAGNOSIS — S82865D Nondisplaced Maisonneuve's fracture of left leg, subsequent encounter for closed fracture with routine healing: Secondary | ICD-10-CM | POA: Diagnosis not present

## 2023-12-15 NOTE — Progress Notes (Signed)
   There were no vitals taken for this visit.  There is no height or weight on file to calculate BMI.  Chief Complaint  Patient presents with   Ankle Injury    Encounter Diagnosis  Name Primary?   Closed nondisplaced Maisonneuve fracture of left lower extremity with routine healing, subsequent encounter Yes    DOI/DOS/ Date: 11/26/23  Improved

## 2023-12-15 NOTE — Progress Notes (Signed)
 Fracture care follow-up  There were no vitals taken for this visit.  There is no height or weight on file to calculate BMI.  Chief Complaint  Patient presents with   Ankle Injury    Encounter Diagnosis  Name Primary?   Closed nondisplaced Maisonneuve fracture of left lower extremity with routine healing, subsequent encounter Yes    DOI/DOS/ Date: 11/26/23  Improved   [name] reports that she is improving with her left leg injury  She has some pain across the anterior ankle capsule and joint and some pain at the proximal fibular fracture site  However she has basically walking in the boot  I did do a stress test on her today with external rotation stress she had no pain with that  DG Ankle Complete Left Result Date: 12/15/2023 Ankle imaging left ankle 3 views evaluate ankle mortise patient has Maisonneuve fracture As previously noted there is hardware in the great toe area and first metatarsal the ankle mortise itself is intact.  There is been no change in position or separation of the joint itself Impression send intact ankle mortise     Plan follow-up in 3 weeks on her birthday and she is scheduled to go back to work on the 16th I think to take her out of the boot at that point

## 2024-01-04 ENCOUNTER — Ambulatory Visit (INDEPENDENT_AMBULATORY_CARE_PROVIDER_SITE_OTHER): Payer: Self-pay

## 2024-01-04 ENCOUNTER — Encounter: Payer: Self-pay | Admitting: Orthopedic Surgery

## 2024-01-04 ENCOUNTER — Ambulatory Visit: Admitting: Orthopedic Surgery

## 2024-01-04 DIAGNOSIS — S82865D Nondisplaced Maisonneuve's fracture of left leg, subsequent encounter for closed fracture with routine healing: Secondary | ICD-10-CM

## 2024-01-04 NOTE — Progress Notes (Signed)
 Follow-up nonoperative management of Maisonneuve fracture left lower extremity  [name] continues to do well she is anxious to return to work on Monday with her new job She has been walking quite a bit lately because her mother has been in the hospital at [facility]  She has no pain  She has no instability of the ankle  The proximal fibular fracture is nontender  Imaging studies show that the proximal fibular fracture is nondisplaced has excellent callus and that the ankle mortise is intact  She can start weaning herself out of the boot follow-up 4 to 6 weeks to make sure she does not need any therapy and that everything has returned to normal

## 2024-01-04 NOTE — Progress Notes (Signed)
   There were no vitals taken for this visit.  There is no height or weight on file to calculate BMI.  Chief Complaint  Patient presents with   Ankle Injury    Left     Encounter Diagnosis  Name Primary?   Closed nondisplaced Maisonneuve fracture of left lower extremity with routine healing, subsequent encounter 11/26/23 Yes    DOI/DOS/ Date: 11/26/23  Improved

## 2024-01-16 ENCOUNTER — Encounter: Payer: Self-pay | Admitting: Family Medicine

## 2024-01-17 MED ORDER — QUETIAPINE FUMARATE 100 MG PO TABS: 200.0000 mg | ORAL_TABLET | Freq: Every day | ORAL | 1 refills | Status: DC

## 2024-01-17 NOTE — Addendum Note (Signed)
 Addended by: Tarahji Ramthun P on: 01/17/2024 08:58 AM   Modules accepted: Orders

## 2024-01-17 NOTE — Telephone Encounter (Signed)
 Requesting rx rf of Seroquel  100mg   Message from patient  sometimes I will take 3 at night if 2 doesn't work that's why it is early. I have not had health insurance for awhile but I do have it now so I can make appointment soon to see her. Please let me know about the rx Last written 11/27/2023 as 90 day supply Last OV 02/14/2023 Upcoming appt = none

## 2024-02-01 ENCOUNTER — Ambulatory Visit: Admitting: Orthopedic Surgery

## 2024-02-01 DIAGNOSIS — S82865D Nondisplaced Maisonneuve's fracture of left leg, subsequent encounter for closed fracture with routine healing: Secondary | ICD-10-CM

## 2024-02-01 NOTE — Progress Notes (Signed)
   There were no vitals taken for this visit.  There is no height or weight on file to calculate BMI.  Chief Complaint  Patient presents with   Fracture    DOI: 11/26/23    No diagnosis found.  DOI/DOS/ Date: 11/26/23  Improved     States has pain has to drag foot / wearing crocs today

## 2024-02-01 NOTE — Progress Notes (Signed)
 Follow-up fracture care  Chief Complaint  Patient presents with   Fracture    DOI: 11/26/23    Encounter Diagnosis  Name Primary?   Closed nondisplaced Maisonneuve fracture of left lower extremity with routine healing, subsequent encounter Yes    DOI/DOS/ Date: 11/26/23  Improved  States has pain has to drag foot / wearing crocs today   [name] continues to improve however she still has some residual deficits  #1 pain across the front of the ankle joint and dorsum of the foot  #2 Achilles tendon is tight  Right to left comparison shows abnormal dorsiflexion on the left compared to the right  Proximal fibula fracture healed The syndesmosis is nontender tibia and fibula  Ankle joint is stable  Recommend physical therapy return in 6 weeks

## 2024-02-02 ENCOUNTER — Encounter: Payer: Self-pay | Admitting: Orthopedic Surgery

## 2024-02-16 ENCOUNTER — Encounter: Payer: Self-pay | Admitting: Family Medicine

## 2024-02-16 DIAGNOSIS — G250 Essential tremor: Secondary | ICD-10-CM

## 2024-02-16 DIAGNOSIS — F988 Other specified behavioral and emotional disorders with onset usually occurring in childhood and adolescence: Secondary | ICD-10-CM

## 2024-02-16 MED ORDER — PRIMIDONE 50 MG PO TABS: 100.0000 mg | ORAL_TABLET | Freq: Every day | ORAL | 0 refills | Status: DC

## 2024-02-16 NOTE — Telephone Encounter (Signed)
 Patient scheduled.

## 2024-02-19 ENCOUNTER — Other Ambulatory Visit: Payer: Self-pay | Admitting: Family Medicine

## 2024-02-19 ENCOUNTER — Encounter: Payer: Self-pay | Admitting: Family Medicine

## 2024-02-19 ENCOUNTER — Encounter: Payer: Self-pay | Admitting: Orthopedic Surgery

## 2024-02-19 DIAGNOSIS — F339 Major depressive disorder, recurrent, unspecified: Secondary | ICD-10-CM

## 2024-02-19 MED ORDER — FETZIMA 40 MG PO CP24: 1.0000 | ORAL_CAPSULE | Freq: Every day | ORAL | 0 refills | Status: DC

## 2024-02-19 MED ORDER — OMEPRAZOLE 40 MG PO CPDR: 40.0000 mg | DELAYED_RELEASE_CAPSULE | Freq: Every day | ORAL | 0 refills | Status: DC

## 2024-02-19 MED ORDER — FETZIMA 40 MG PO CP24
1.0000 | ORAL_CAPSULE | Freq: Every day | ORAL | 1 refills | Status: DC
Start: 2024-02-19 — End: 2024-02-19

## 2024-02-20 ENCOUNTER — Telehealth: Payer: Self-pay

## 2024-02-20 ENCOUNTER — Other Ambulatory Visit (HOSPITAL_COMMUNITY): Payer: Self-pay

## 2024-02-20 NOTE — Telephone Encounter (Signed)
 Pharmacy Patient Advocate Encounter   Received notification from Patient Pharmacy that prior authorization for Fetzima  40mg  ER caps is required/requested.   Insurance verification completed.   The patient is insured through [employer] .   Per test claim: PA required; PA submitted to above mentioned insurance via CoverMyMeds Key/confirmation #/EOC [id number] Status is pending

## 2024-02-21 ENCOUNTER — Other Ambulatory Visit (HOSPITAL_COMMUNITY): Payer: Self-pay

## 2024-02-21 NOTE — Telephone Encounter (Signed)
 Pharmacy Patient Advocate Encounter  Received notification from [facility] that Prior Authorization for Fetzima  40mg  ER caps has been APPROVED from 02/20/24 to 07/24/38. Ran test claim, Copay is $10. This test claim was processed through [facility]- copay amounts may vary at other pharmacies due to pharmacy/plan contracts, or as the patient moves through the different stages of their insurance plan.   PA #/Case ID/Reference #: [id number]  Left a message at [facility] to notify of the approval

## 2024-03-05 ENCOUNTER — Telehealth: Payer: Self-pay

## 2024-03-14 ENCOUNTER — Encounter: Admitting: Orthopedic Surgery

## 2024-03-18 ENCOUNTER — Ambulatory Visit (INDEPENDENT_AMBULATORY_CARE_PROVIDER_SITE_OTHER): Admitting: Orthopedic Surgery

## 2024-03-18 DIAGNOSIS — S82865D Nondisplaced Maisonneuve's fracture of left leg, subsequent encounter for closed fracture with routine healing: Secondary | ICD-10-CM

## 2024-03-18 NOTE — Progress Notes (Signed)
     Chief Complaint  Patient presents with   Ankle Injury    Left- DOI 11/25/23 patient states its good now    Encounter Diagnosis  Name Primary?   Closed nondisplaced Maisonneuve fracture of left lower extremity with routine healing, subsequent encounter 11/26/23 Yes    DOI/DOS/ Date: 11/25/23  Improved  53 year old female Maisonneuve fracture left ankle treated nonoperatively  She developed some tightness in her Achilles and range of motion issues which have seemed to resolve with physical therapy  Exam  Diagnosis as stated Return as needed

## 2024-03-18 NOTE — Progress Notes (Signed)
   There were no vitals taken for this visit.  There is no height or weight on file to calculate BMI.  Chief Complaint  Patient presents with   Ankle Injury    Left- DOI 11/25/23 - didn't do the therapy - they needed records she couldn't get here to sign release    Encounter Diagnosis  Name Primary?   Closed nondisplaced Maisonneuve fracture of left lower extremity with routine healing, subsequent encounter 11/26/23 Yes    DOI/DOS/ Date: 11/25/23  Improved

## 2024-03-19 ENCOUNTER — Ambulatory Visit (INDEPENDENT_AMBULATORY_CARE_PROVIDER_SITE_OTHER): Payer: PRIVATE HEALTH INSURANCE | Admitting: Family Medicine

## 2024-03-19 ENCOUNTER — Encounter: Payer: Self-pay | Admitting: Family Medicine

## 2024-03-19 VITALS — BP 135/85 | HR 70 | Ht 65.0 in | Wt 159.1 lb

## 2024-03-19 DIAGNOSIS — F900 Attention-deficit hyperactivity disorder, predominantly inattentive type: Secondary | ICD-10-CM | POA: Diagnosis not present

## 2024-03-19 DIAGNOSIS — K529 Noninfective gastroenteritis and colitis, unspecified: Secondary | ICD-10-CM

## 2024-03-19 DIAGNOSIS — F339 Major depressive disorder, recurrent, unspecified: Secondary | ICD-10-CM

## 2024-03-19 DIAGNOSIS — F5101 Primary insomnia: Secondary | ICD-10-CM | POA: Diagnosis not present

## 2024-03-19 DIAGNOSIS — F988 Other specified behavioral and emotional disorders with onset usually occurring in childhood and adolescence: Secondary | ICD-10-CM | POA: Diagnosis not present

## 2024-03-19 DIAGNOSIS — E618 Deficiency of other specified nutrient elements: Secondary | ICD-10-CM

## 2024-03-19 DIAGNOSIS — R7989 Other specified abnormal findings of blood chemistry: Secondary | ICD-10-CM

## 2024-03-19 DIAGNOSIS — E538 Deficiency of other specified B group vitamins: Secondary | ICD-10-CM

## 2024-03-19 DIAGNOSIS — G43009 Migraine without aura, not intractable, without status migrainosus: Secondary | ICD-10-CM

## 2024-03-19 DIAGNOSIS — R5383 Other fatigue: Secondary | ICD-10-CM

## 2024-03-19 DIAGNOSIS — Z13220 Encounter for screening for lipoid disorders: Secondary | ICD-10-CM

## 2024-03-19 MED ORDER — ONDANSETRON HCL 4 MG PO TABS: 4.0000 mg | ORAL_TABLET | Freq: Four times a day (QID) | ORAL | 0 refills | Status: AC | PRN

## 2024-03-19 MED ORDER — AMPHETAMINE-DEXTROAMPHETAMINE 20 MG PO TABS: 20.0000 mg | ORAL_TABLET | Freq: Two times a day (BID) | ORAL | 0 refills | Status: DC

## 2024-03-19 MED ORDER — AIMOVIG 70 MG/ML ~~LOC~~ SOAJ: 70.0000 mg | SUBCUTANEOUS | 1 refills | Status: DC

## 2024-03-19 MED ORDER — AMPHETAMINE-DEXTROAMPHETAMINE 20 MG PO TABS
20.0000 mg | ORAL_TABLET | Freq: Two times a day (BID) | ORAL | 0 refills | Status: DC
Start: 2024-04-16 — End: 2024-03-22

## 2024-03-19 MED ORDER — OMEPRAZOLE 40 MG PO CPDR: 40.0000 mg | DELAYED_RELEASE_CAPSULE | Freq: Every day | ORAL | 3 refills | Status: DC

## 2024-03-19 MED ORDER — QUETIAPINE FUMARATE 100 MG PO TABS: 300.0000 mg | ORAL_TABLET | Freq: Every day | ORAL | 1 refills | Status: DC

## 2024-03-19 MED ORDER — ZOLPIDEM TARTRATE 5 MG PO TABS: 5.0000 mg | ORAL_TABLET | Freq: Every evening | ORAL | 1 refills | Status: DC | PRN

## 2024-03-19 MED ORDER — FETZIMA 40 MG PO CP24: 1.0000 | ORAL_CAPSULE | Freq: Every day | ORAL | 1 refills | Status: DC

## 2024-03-19 NOTE — Assessment & Plan Note (Addendum)
 She has tried and failed Topamax, NSAIDs, amitriptyline, beta-blockers (propranolol ), muscle relaxers.  Has tried Imitrex  injectable and tabs.  Most of these medications have either been too too sedating or not provided significant relief for her headaches.  Will see if we can get Aimovig  or Emgality  covered.  It looks like Aimovig  might be better covered so we will start with that 1.

## 2024-03-19 NOTE — Assessment & Plan Note (Signed)
 Stable. Meds refilled. F/U in 6 mo BP at goal.

## 2024-03-19 NOTE — Assessment & Plan Note (Signed)
 We discussed options.  We could try low-dose her Ambien  5 mg which is FDA approved for women and take that with 2 tabs of the quetiapine  instead of 3 tabs and see if that is helpful since she is still struggling with sleep quality.

## 2024-03-19 NOTE — Progress Notes (Signed)
 Established Patient Office Visit  Subjective  Patient ID: [name], female    DOB: August 16, 1970  Age: 53 y.o. MRN: [id number]  Chief Complaint  Patient presents with   Medical Management of Chronic Issues   Nausea   Emesis    Pt reports that she has not felt well since Sunday. She stated that she vomited yesterday and didn't work yesterday and today    HPI  She is here to follow-up today for ADD.  She is doing well and tolerating medications well no chest pain or shortness of breath or side effects.  She does feel like the medications helpful it is usually for twice a day but occasionally she only takes it once a day.  In the last 2 days she has had nausea vomiting.  No bloody stools.  She did have to miss work yesterday and today.  She is a G almost canceled her appointment today.  She has also been having daily headaches.  This has been going on for several weeks.  Sometimes she will also have have like a hot flash where she will get hot and then feel sick and then sometimes that will trigger a headache as well she has been taking Excedrin very frequently because it does decrease the intensity of the headache though it does not make it go away completely.  In the past she has tried Topamax, beta-blockers, amitriptyline, Imitrex  injectable and tabs.  None have provided great relief for her.  Follow-up insomnia-.  She does well with the quetiapine  but has to take 3 tabs which is 300 mg.  She had tried Ambien  in the past but the only dose that worked well for her was taking more than 10 mg.  Eulas did not work.   He started a new job recently she was actually supposed to start in May but actually fell in her home and had a fracture of her leg and was in a boot and so had to delay work for about a month.  Unfortunately she did not have insurance coverage at that time.  She was released yesterday to back to full activity and did not require surgery which was wonderful.  He is  planning on following back up with GI.    ROS    Objective:     BP 135/85   Pulse 70   Ht 5' 5 (1.651 m)   Wt 159 lb 1.9 oz (72.2 kg)   SpO2 100%   BMI 26.48 kg/m    Physical Exam Vitals and nursing note reviewed.  Constitutional:      Appearance: Normal appearance.  HENT:     Head: Normocephalic and atraumatic.  Eyes:     Conjunctiva/sclera: Conjunctivae normal.  Cardiovascular:     Rate and Rhythm: Normal rate and regular rhythm.  Pulmonary:     Effort: Pulmonary effort is normal.     Breath sounds: Normal breath sounds.  Skin:    General: Skin is warm and dry.  Neurological:     Mental Status: She is alert.  Psychiatric:        Mood and Affect: Mood normal.      No results found for any visits on 03/19/24.    The 10-year ASCVD risk score (Arnett DK, et al., 2019) is: 5.4%    Assessment & Plan:   Problem List Items Addressed This Visit       Cardiovascular and Mediastinum   Migraine headache   She has tried and  failed Topamax, NSAIDs, amitriptyline, beta-blockers (propranolol ), muscle relaxers.  Has tried Imitrex  injectable and tabs.  Most of these medications have either been too too sedating or not provided significant relief for her headaches.  Will see if we can get Aimovig  or Emgality  covered.  It looks like Aimovig  might be better covered so we will start with that 1.      Relevant Medications   Levomilnacipran  HCl ER (FETZIMA ) 40 MG CP24   Erenumab -aooe (AIMOVIG ) 70 MG/ML SOAJ     Other   Insomnia   We discussed options.  We could try low-dose her Ambien  5 mg which is FDA approved for women and take that with 2 tabs of the quetiapine  instead of 3 tabs and see if that is helpful since she is still struggling with sleep quality.      Relevant Orders   CMP14+EGFR   Lipid panel   CBC   Iron, TIBC and Ferritin Panel   VITAMIN D  25 Hydroxy (Vit-D Deficiency, Fractures)   TSH   Depression, recurrent (HCC)   Relevant Medications    Levomilnacipran  HCl ER (FETZIMA ) 40 MG CP24   ADD (attention deficit disorder) - Primary   Stable. Meds refilled. F/U in 6 mo BP at goal.       Relevant Medications   amphetamine -dextroamphetamine  (ADDERALL) 20 MG tablet   amphetamine -dextroamphetamine  (ADDERALL) 20 MG tablet (Start on 04/16/2024)   amphetamine -dextroamphetamine  (ADDERALL) 20 MG tablet (Start on 05/14/2024)   Other Relevant Orders   CMP14+EGFR   Lipid panel   CBC   Iron, TIBC and Ferritin Panel   VITAMIN D  25 Hydroxy (Vit-D Deficiency, Fractures)   TSH   Other Visit Diagnoses       Attention deficit disorder (ADD) without hyperactivity       Relevant Medications   amphetamine -dextroamphetamine  (ADDERALL) 20 MG tablet   amphetamine -dextroamphetamine  (ADDERALL) 20 MG tablet (Start on 04/16/2024)   amphetamine -dextroamphetamine  (ADDERALL) 20 MG tablet (Start on 05/14/2024)     Other fatigue       Relevant Orders   CMP14+EGFR   Lipid panel   CBC   Iron, TIBC and Ferritin Panel   VITAMIN D  25 Hydroxy (Vit-D Deficiency, Fractures)   TSH   B12 and Folate Panel     Low vitamin D  level       Relevant Orders   CMP14+EGFR   Lipid panel   CBC   Iron, TIBC and Ferritin Panel   VITAMIN D  25 Hydroxy (Vit-D Deficiency, Fractures)   TSH   B12 and Folate Panel     Low folate       Relevant Orders   CMP14+EGFR   Lipid panel   CBC   Iron, TIBC and Ferritin Panel   VITAMIN D  25 Hydroxy (Vit-D Deficiency, Fractures)   TSH   B12 and Folate Panel     Screening, lipid       Relevant Orders   Lipid panel     Mineral deficiency       Relevant Orders   CMP14+EGFR   Lipid panel   CBC   Iron, TIBC and Ferritin Panel   VITAMIN D  25 Hydroxy (Vit-D Deficiency, Fractures)   TSH   B12 and Folate Panel     Gastroenteritis          She has also been experiencing a lot of fatigue lately.  So we will check for some mineral deficiencies she has had vitamin D  and folate deficiency and low iron in the past.  Return  in about  6 months (around 09/19/2024).    Dorothyann Byars, MD

## 2024-03-20 ENCOUNTER — Encounter: Payer: Self-pay | Admitting: Family Medicine

## 2024-03-20 ENCOUNTER — Ambulatory Visit: Payer: Self-pay | Admitting: Family Medicine

## 2024-03-20 DIAGNOSIS — R748 Abnormal levels of other serum enzymes: Secondary | ICD-10-CM

## 2024-03-20 DIAGNOSIS — R7989 Other specified abnormal findings of blood chemistry: Secondary | ICD-10-CM

## 2024-03-20 LAB — B12 AND FOLATE PANEL
Folate: 9 ng/mL (ref >3.0)
Vitamin B-12: 391 pg/mL (ref 232–1245)

## 2024-03-20 LAB — LIPID PANEL
Chol/HDL Ratio: 5 ratio — ABNORMAL HIGH (ref 0.0–4.4)
Cholesterol, Total: 330 mg/dL — ABNORMAL HIGH (ref 100–199)
HDL: 66 mg/dL (ref >39)
LDL Chol Calc (NIH): 225 mg/dL — ABNORMAL HIGH (ref 0–99)
Triglycerides: 199 mg/dL — ABNORMAL HIGH (ref 0–149)
VLDL Cholesterol Cal: 39 mg/dL (ref 5–40)

## 2024-03-20 LAB — CMP14+EGFR
ALT: 22 IU/L (ref 0–32)
AST: 22 IU/L (ref 0–40)
Albumin: 4.8 g/dL (ref 3.8–4.9)
Alkaline Phosphatase: 134 IU/L — ABNORMAL HIGH (ref 44–121)
BUN/Creatinine Ratio: 14 (ref 9–23)
BUN: 9 mg/dL (ref 6–24)
Bilirubin Total: 0.4 mg/dL (ref 0.0–1.2)
CO2: 23 mmol/L (ref 20–29)
Calcium: 9.8 mg/dL (ref 8.7–10.2)
Chloride: 96 mmol/L (ref 96–106)
Creatinine, Ser: 0.66 mg/dL (ref 0.57–1.00)
Globulin, Total: 2.2 g/dL (ref 1.5–4.5)
Glucose: 88 mg/dL (ref 70–99)
Potassium: 4.8 mmol/L (ref 3.5–5.2)
Sodium: 137 mmol/L (ref 134–144)
Total Protein: 7 g/dL (ref 6.0–8.5)
eGFR: 104 mL/min/1.73 (ref >59)

## 2024-03-20 LAB — IRON,TIBC AND FERRITIN PANEL
Ferritin: 77 ng/mL (ref 15–150)
Iron Saturation: 41 % (ref 15–55)
Iron: 147 ug/dL (ref 27–159)
Total Iron Binding Capacity: 357 ug/dL (ref 250–450)
UIBC: 210 ug/dL (ref 131–425)

## 2024-03-20 LAB — CBC
Hematocrit: 45 % (ref 34.0–46.6)
Hemoglobin: 14.5 g/dL (ref 11.1–15.9)
MCH: 29.1 pg (ref 26.6–33.0)
MCHC: 32.2 g/dL (ref 31.5–35.7)
MCV: 90 fL (ref 79–97)
Platelets: 425 x10E3/uL (ref 150–450)
RBC: 4.98 x10E6/uL (ref 3.77–5.28)
RDW: 12.1 % (ref 11.7–15.4)
WBC: 8.4 x10E3/uL (ref 3.4–10.8)

## 2024-03-20 LAB — TSH: TSH: 0.711 u[IU]/mL (ref 0.450–4.500)

## 2024-03-20 LAB — VITAMIN D 25 HYDROXY (VIT D DEFICIENCY, FRACTURES): Vit D, 25-Hydroxy: 9.7 ng/mL — ABNORMAL LOW (ref 30.0–100.0)

## 2024-03-20 MED ORDER — VITAMIN D (ERGOCALCIFEROL) 1.25 MG (50000 UNIT) PO CAPS: 50000.0000 [IU] | ORAL_CAPSULE | ORAL | 3 refills | Status: AC

## 2024-03-20 NOTE — Progress Notes (Signed)
 HI [name], your Vit D is really low. I am going to send over Rx version of vit D to take once a week. Plan to recheck level in 2-3 months. Alk phos is up a little.  We will call lab and see if we can order a GGT.  LDL and triglycerides are up.  Work on [employer]. No iron deficiency or anemia. Thyroid  and folate are normal. B12 on low end of normal.  I would recommend a daily MVI that has B12 in it.

## 2024-03-21 ENCOUNTER — Encounter: Payer: Self-pay | Admitting: Family Medicine

## 2024-03-21 DIAGNOSIS — G43009 Migraine without aura, not intractable, without status migrainosus: Secondary | ICD-10-CM

## 2024-03-21 DIAGNOSIS — G250 Essential tremor: Secondary | ICD-10-CM

## 2024-03-21 DIAGNOSIS — F339 Major depressive disorder, recurrent, unspecified: Secondary | ICD-10-CM

## 2024-03-21 DIAGNOSIS — F988 Other specified behavioral and emotional disorders with onset usually occurring in childhood and adolescence: Secondary | ICD-10-CM

## 2024-03-21 DIAGNOSIS — R7989 Other specified abnormal findings of blood chemistry: Secondary | ICD-10-CM

## 2024-03-21 NOTE — Telephone Encounter (Signed)
 Attempted call to patient.  Needing to verify that she is needing Adderall 20mg  to the mail order pharmacy ? And whether taking both zofran  and compazine  And need to review pended medications to see that these are the refills needing to be sent to mail order Requesting a call back - gave both return contact numbers  Main # [phone] and direct line [phone]

## 2024-03-22 MED ORDER — AMPHETAMINE-DEXTROAMPHETAMINE 20 MG PO TABS: 20.0000 mg | ORAL_TABLET | Freq: Two times a day (BID) | ORAL | 0 refills | Status: AC

## 2024-03-22 MED ORDER — PRIMIDONE 50 MG PO TABS: 100.0000 mg | ORAL_TABLET | Freq: Every day | ORAL | 3 refills | Status: AC

## 2024-03-22 MED ORDER — PROCHLORPERAZINE MALEATE 5 MG PO TABS: 5.0000 mg | ORAL_TABLET | Freq: Three times a day (TID) | ORAL | 0 refills | Status: AC | PRN

## 2024-03-22 MED ORDER — QUETIAPINE FUMARATE 100 MG PO TABS: 300.0000 mg | ORAL_TABLET | Freq: Every day | ORAL | 1 refills | Status: AC

## 2024-03-22 MED ORDER — OMEPRAZOLE 40 MG PO CPDR: 40.0000 mg | DELAYED_RELEASE_CAPSULE | Freq: Every day | ORAL | 3 refills | Status: AC

## 2024-03-22 MED ORDER — FETZIMA 40 MG PO CP24: 1.0000 | ORAL_CAPSULE | Freq: Every day | ORAL | 1 refills | Status: AC

## 2024-03-22 NOTE — Telephone Encounter (Signed)
 Yes lets verify adderall too? I had just sent 3 rx to [facility].  I can send new ones to this pharmacy but then we will probably have to call and cancel the other ones.

## 2024-03-22 NOTE — Telephone Encounter (Signed)
 Spoke with patient.  Went through medication list Verified that what is requested is what patient is requesting to go to mail order pharmacy- [facility]   Adderall 20 mg was just sent refills  to fill 03/19/2024, 04/16/2024 and  05/14/2024 but patient requesting to  change pharmacies and have these sent to mail order   Primidone   50mg  Lat written 02/16/2024 Compazine  Last written 11/14/2023 Last OV 03/19/2024 Upcoming appt = none

## 2024-03-27 ENCOUNTER — Telehealth: Payer: Self-pay

## 2024-03-27 NOTE — Telephone Encounter (Signed)
 PA is not required for fetzima  rx. PA valid from 02/20/24 - 07/24/2038.

## 2024-04-05 ENCOUNTER — Other Ambulatory Visit (HOSPITAL_COMMUNITY): Payer: Self-pay

## 2024-04-05 ENCOUNTER — Telehealth: Payer: Self-pay

## 2024-04-05 ENCOUNTER — Encounter: Payer: Self-pay | Admitting: Family Medicine

## 2024-04-05 NOTE — Telephone Encounter (Signed)
 Pharmacy Patient Advocate Encounter  Received notification from [facility] that Prior Authorization for Aimovig  70mg /ml has been APPROVED from 04/05/24 to 10/02/24   PA #/Case ID/Reference #: [id number]  Per test claim, Must fill at [facility] pharmacy.

## 2024-04-05 NOTE — Telephone Encounter (Signed)
 Pharmacy Patient Advocate Encounter   Received notification from Patient Advice Request messages that prior authorization for Aimovig  70mg /ml is required/requested.   Insurance verification completed.   The patient is insured through [employer] .   Per test claim: PA required; PA submitted to above mentioned insurance via Latent Key/confirmation #/EOC [id number] Status is pending

## 2024-04-10 ENCOUNTER — Encounter: Payer: Self-pay | Admitting: Family Medicine

## 2024-04-17 ENCOUNTER — Encounter: Payer: Self-pay | Admitting: Family Medicine

## 2024-04-17 DIAGNOSIS — G43009 Migraine without aura, not intractable, without status migrainosus: Secondary | ICD-10-CM

## 2024-04-17 MED ORDER — AIMOVIG 70 MG/ML ~~LOC~~ SOAJ: 70.0000 mg | SUBCUTANEOUS | 1 refills | Status: DC

## 2024-04-19 MED ORDER — QULIPTA 10 MG PO TABS: 1.0000 | ORAL_TABLET | Freq: Every day | ORAL | 1 refills | Status: AC

## 2024-04-19 NOTE — Addendum Note (Signed)
 Addended by: Kache Mcclurg D on: 04/19/2024 11:41 AM   Modules accepted: Orders

## 2024-04-22 ENCOUNTER — Other Ambulatory Visit (HOSPITAL_COMMUNITY): Payer: Self-pay

## 2024-04-22 ENCOUNTER — Telehealth: Payer: Self-pay

## 2024-04-22 NOTE — Telephone Encounter (Signed)
 Pharmacy Patient Advocate Encounter   Received notification from CoverMyMeds that prior authorization for Quipta 10mg  tabs is required/requested.   Insurance verification completed.   The patient is insured through [employer] .   Per test claim: PA required; PA submitted to above mentioned insurance via Latent Key/confirmation #/EOC [id number] Status is pending

## 2024-04-23 NOTE — Telephone Encounter (Signed)
 Toribio Edelman A, CPhT to Me (Selected Message)     04/23/24  9:02 AM Good morning. The Qulipta  has been approved for the patient and the pharmacy contacted.

## 2024-04-23 NOTE — Telephone Encounter (Signed)
 Pharmacy Patient Advocate Encounter  Received notification from [facility] that Prior Authorization for Qulipta  10mg  tabs has been APPROVED from 04/22/24 to 10/19/24. Spoke to pharmacy to process.Copay is $0.    PA #/Case ID/Reference #: [id number]  Spoke with Norleen at [facility] to notify of the approval.

## 2024-06-07 ENCOUNTER — Ambulatory Visit: Payer: PRIVATE HEALTH INSURANCE | Admitting: Gastroenterology

## 2024-06-07 ENCOUNTER — Encounter: Payer: Self-pay | Admitting: Gastroenterology

## 2024-06-07 VITALS — BP 130/72 | HR 89 | Ht 65.5 in | Wt 163.2 lb

## 2024-06-07 DIAGNOSIS — R109 Unspecified abdominal pain: Secondary | ICD-10-CM | POA: Diagnosis not present

## 2024-06-07 DIAGNOSIS — K2100 Gastro-esophageal reflux disease with esophagitis, without bleeding: Secondary | ICD-10-CM | POA: Diagnosis not present

## 2024-06-07 DIAGNOSIS — R140 Abdominal distension (gaseous): Secondary | ICD-10-CM | POA: Diagnosis not present

## 2024-06-07 DIAGNOSIS — Z860100 Personal history of colon polyps, unspecified: Secondary | ICD-10-CM

## 2024-06-07 DIAGNOSIS — K5909 Other constipation: Secondary | ICD-10-CM | POA: Diagnosis not present
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
## 2024-06-07 NOTE — Patient Instructions (Addendum)
 We are referring you to [facility].  They will contact you directly to schedule an appointment.  It may take a week or more before you hear from them.  Please feel free to contact us  if you have not heard from them within 2 weeks and we will follow up on the referral.    We are referring you to [facility].  They will contact you directly to schedule an appointment.  It may take a week or more before you hear from them.  Please feel free to contact us  if you have not heard from them within 2 weeks and we will follow up on the referral.    Thank you for entrusting me with your care and for choosing [facility], Dr. Sandor Flatter   _______________________________________________________  If your blood pressure at your visit was 140/90 or greater, please contact your primary care physician to follow up on this.  _______________________________________________________  If you are age 3 or older, your body mass index should be between 23-30. Your Body mass index is 26.75 kg/m. If this is out of the aforementioned range listed, please consider follow up with your Primary Care Provider.  If you are age 46 or younger, your body mass index should be between 19-25. Your Body mass index is 26.75 kg/m. If this is out of the aformentioned range listed, please consider follow up with your Primary Care Provider.   ________________________________________________________  The [facility] providers would like to encourage you to use MYCHART to communicate with providers for non-urgent requests or questions.  Due to long hold times on the telephone, sending your provider a message by [facility] may be a faster and more efficient way to get a response.  Please allow 48 business hours for a response.  Please remember that this is for non-urgent requests.  _______________________________________________________  [facility] is using a team-based approach to care.   Your team is made up of your doctor and two to three APPS. Our APPS (Nurse Practitioners and Physician Assistants) work with your physician to ensure care continuity for you. They are fully qualified to address your health concerns and develop a treatment plan. They communicate directly with your gastroenterologist to care for you. Seeing the Advanced Practice Practitioners on your physician's team can help you by facilitating care more promptly, often allowing for earlier appointments, access to diagnostic testing, procedures, and other specialty referrals.

## 2024-07-19 NOTE — Telephone Encounter (Signed)
 Left message

## 2024-08-15 ENCOUNTER — Other Ambulatory Visit: Payer: Self-pay

## 2024-08-15 ENCOUNTER — Ambulatory Visit: Payer: PRIVATE HEALTH INSURANCE | Attending: Gastroenterology | Admitting: Physical Therapy

## 2024-08-15 DIAGNOSIS — M62838 Other muscle spasm: Secondary | ICD-10-CM | POA: Diagnosis present

## 2024-08-15 DIAGNOSIS — R293 Abnormal posture: Secondary | ICD-10-CM | POA: Insufficient documentation

## 2024-08-15 DIAGNOSIS — M6281 Muscle weakness (generalized): Secondary | ICD-10-CM | POA: Insufficient documentation

## 2024-08-15 DIAGNOSIS — R279 Unspecified lack of coordination: Secondary | ICD-10-CM | POA: Insufficient documentation
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
## 2024-08-15 NOTE — Patient Instructions (Addendum)
" °  While lying down complete 5-8 mins 2x (am/pm) gentle circles from right to left, repeat. Shouldn't be painful  BALLOON BREATHING - bring open fist to mouth and blow out like blowing up a balloon instead of pushing    Types of Fiber  There are two main types of fiber:  insoluble and soluble.  Both of these types can prevent and relieve constipation and diarrhea, although some people find one or the other to be more easily digested.  This handout details information about both types of fiber. recommended 25-35 grams of fiber per day,  average 9-12 grams per meal   key is a balance between soluble and insoluble  Insoluble Fiber        Functions of Insoluble Fiber moves bulk through the intestines  controls and balances the pH (acidity) in the intestines   This type of fiber should be avoided or reduced if you have soft, frequent bowel movements or leakage      Benefits of Insoluble Fiber promotes regular bowel movement and prevents constipation  removes fecal waste through colon in less time  keeps an optimal pH in intestines to prevent microbes from producing cancer substances, therefore preventing colon cancer        Food Sources of Insoluble Fiber whole-wheat products  wheat bran millers bran corn bran  flax seed or other seeds vegetables such as green beans, broccoli, cauliflower and potato skins  fruit skins and root vegetable skins  popcorn brown rice  Soluble Fiber( Types 5,6,7 - loose stools)       Functions of Soluble Fiber  holds water in the colon to bulk and soften the stool prolongs stomach emptying time so that sugar is released and absorbed more slowly  prevent leakage associated with soft, frequent bowel movements.        Benefits of Soluble Fiber lowers total cholesterol and LDL cholesterol (the bad cholesterol) therefore reducing the risk of heart disease  regulates blood sugar for people with diabetes       Food Sources of Soluble Fiber oat/oat  bran dried beans and peas  nuts  barley  flax seed or other seeds fruits such as oranges, pears, peaches, and apples  vegetables such as carrots  psyllium husk  prunes  "

## 2024-09-05 ENCOUNTER — Ambulatory Visit: Admitting: Physical Therapy

## 2024-09-12 ENCOUNTER — Ambulatory Visit: Admitting: Physical Therapy

## 2024-09-19 ENCOUNTER — Ambulatory Visit: Admitting: Physical Therapy
